# Patient Record
Sex: Male | Born: 1975 | Race: White | Hispanic: No | Marital: Single | Smoking: Current every day smoker
Health system: Southern US, Community
[De-identification: ages and names within clinical notes are randomized; demographics above are authoritative.]

## PROBLEM LIST (undated history)

## (undated) DIAGNOSIS — B1920 Unspecified viral hepatitis C without hepatic coma: Secondary | ICD-10-CM

## (undated) DIAGNOSIS — F329 Major depressive disorder, single episode, unspecified: Secondary | ICD-10-CM

## (undated) DIAGNOSIS — A4902 Methicillin resistant Staphylococcus aureus infection, unspecified site: Secondary | ICD-10-CM

## (undated) DIAGNOSIS — F1121 Opioid dependence, in remission: Secondary | ICD-10-CM

## (undated) DIAGNOSIS — I442 Atrioventricular block, complete: Secondary | ICD-10-CM

## (undated) DIAGNOSIS — Z953 Presence of xenogenic heart valve: Secondary | ICD-10-CM

## (undated) DIAGNOSIS — I079 Rheumatic tricuspid valve disease, unspecified: Secondary | ICD-10-CM

## (undated) DIAGNOSIS — Z8679 Personal history of other diseases of the circulatory system: Principal | ICD-10-CM

## (undated) DIAGNOSIS — Z950 Presence of cardiac pacemaker: Secondary | ICD-10-CM

## (undated) HISTORY — PX: BELOW KNEE LEG AMPUTATION: SUR23

## (undated) HISTORY — DX: Presence of xenogenic heart valve: Z95.3

## (undated) HISTORY — DX: Unspecified viral hepatitis C without hepatic coma: B19.20

## (undated) HISTORY — DX: Personal history of other diseases of the circulatory system: Z86.79

## (undated) HISTORY — DX: Major depressive disorder, single episode, unspecified: F32.9

## (undated) HISTORY — PX: OTHER SURGICAL HISTORY: SHX169

## (undated) HISTORY — PX: PACEMAKER REVISION: EP1221

## (undated) HISTORY — DX: Opioid dependence, in remission: F11.21

## (undated) HISTORY — DX: Atrioventricular block, complete: I44.2

---

## 2017-10-03 DIAGNOSIS — I2690 Septic pulmonary embolism without acute cor pulmonale: Secondary | ICD-10-CM | POA: Insufficient documentation

## 2017-12-08 ENCOUNTER — Ambulatory Visit: Payer: Self-pay

## 2017-12-09 ENCOUNTER — Ambulatory Visit: Payer: Self-pay

## 2017-12-11 ENCOUNTER — Ambulatory Visit: Payer: Self-pay | Admitting: Internal Medicine

## 2017-12-11 ENCOUNTER — Encounter: Payer: Self-pay | Admitting: Internal Medicine

## 2017-12-11 ENCOUNTER — Other Ambulatory Visit: Payer: Self-pay

## 2017-12-11 VITALS — BP 121/77 | HR 100 | Temp 98.7°F | Wt 141.6 lb

## 2017-12-11 DIAGNOSIS — Z21 Asymptomatic human immunodeficiency virus [HIV] infection status: Secondary | ICD-10-CM

## 2017-12-11 DIAGNOSIS — F1111 Opioid abuse, in remission: Secondary | ICD-10-CM

## 2017-12-11 DIAGNOSIS — Z09 Encounter for follow-up examination after completed treatment for conditions other than malignant neoplasm: Secondary | ICD-10-CM

## 2017-12-11 DIAGNOSIS — B1920 Unspecified viral hepatitis C without hepatic coma: Secondary | ICD-10-CM

## 2017-12-11 DIAGNOSIS — Z79899 Other long term (current) drug therapy: Secondary | ICD-10-CM

## 2017-12-11 DIAGNOSIS — Z950 Presence of cardiac pacemaker: Secondary | ICD-10-CM

## 2017-12-11 DIAGNOSIS — F32A Depression, unspecified: Secondary | ICD-10-CM | POA: Insufficient documentation

## 2017-12-11 DIAGNOSIS — Z833 Family history of diabetes mellitus: Secondary | ICD-10-CM

## 2017-12-11 DIAGNOSIS — I442 Atrioventricular block, complete: Secondary | ICD-10-CM | POA: Insufficient documentation

## 2017-12-11 DIAGNOSIS — F3340 Major depressive disorder, recurrent, in remission, unspecified: Secondary | ICD-10-CM

## 2017-12-11 DIAGNOSIS — F17211 Nicotine dependence, cigarettes, in remission: Secondary | ICD-10-CM

## 2017-12-11 DIAGNOSIS — Z8614 Personal history of Methicillin resistant Staphylococcus aureus infection: Secondary | ICD-10-CM

## 2017-12-11 DIAGNOSIS — Z8679 Personal history of other diseases of the circulatory system: Secondary | ICD-10-CM

## 2017-12-11 DIAGNOSIS — Z954 Presence of other heart-valve replacement: Secondary | ICD-10-CM

## 2017-12-11 DIAGNOSIS — Z953 Presence of xenogenic heart valve: Secondary | ICD-10-CM

## 2017-12-11 DIAGNOSIS — F1121 Opioid dependence, in remission: Secondary | ICD-10-CM

## 2017-12-11 DIAGNOSIS — F329 Major depressive disorder, single episode, unspecified: Secondary | ICD-10-CM | POA: Insufficient documentation

## 2017-12-11 HISTORY — DX: Opioid dependence, in remission: F11.21

## 2017-12-11 HISTORY — DX: Presence of xenogenic heart valve: Z95.3

## 2017-12-11 HISTORY — DX: Personal history of other diseases of the circulatory system: Z86.79

## 2017-12-11 HISTORY — DX: Depression, unspecified: F32.A

## 2017-12-11 MED ORDER — ASPIRIN EC 81 MG PO TBEC: 81.0000 mg | DELAYED_RELEASE_TABLET | Freq: Every day | ORAL | 2 refills | Status: AC

## 2017-12-11 MED ORDER — DULOXETINE HCL 60 MG PO CPEP: 60.0000 mg | ORAL_CAPSULE | Freq: Every day | ORAL | 3 refills | Status: DC

## 2017-12-11 NOTE — Patient Instructions (Addendum)
Thank you for visiting clinic today. I am glad you are doing well and making positive life long changes. I am giving you a referral to see a cardiologist and a cardiothoracic surgeon, please make an appointment according to your convenience. As we discussed that we offer Suboxone treatment for opioid use disorder in our clinic, if you need any help with your disease please let us know. Please follow-up in [id number]-month.

## 2017-12-11 NOTE — Assessment & Plan Note (Signed)
He denies any ongoing depressive symptoms. His symptoms are well controlled on Cymbalta 60 mg daily. His PHQ-9 score today was 2.  -Continue Cymbalta 60 mg daily-refill was provided.

## 2017-12-11 NOTE — Assessment & Plan Note (Signed)
He recently has surgery approximately 3 weeks ago.  He needs to follow-up with the cardiothoracic surgeon. He was having well-healing sternotomy and pacemaker scars. He was not on any anticoagulation, he was told to take just aspirin.  -Referral for cardiothoracic surgery was provided.

## 2017-12-11 NOTE — Assessment & Plan Note (Signed)
He developed the post operative persistent complete heart block, currently s/o dual-chamber pacemaker. He denies any symptoms and exam was within normal limit.  He needs to be established with cardiology for a regular pacemaker check.  Referral for cardiology was provided.

## 2017-12-11 NOTE — Assessment & Plan Note (Addendum)
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  Per chart review he was positive for hep C antibody with negative viral load.  He was also positive for 1/2 HIV antibody, with undetected viral load.  Patient is high risk for HIV and hep C. I called the patient and talk with his mom, she agreed to repeat testing. She would like a blood draw early next week when she will come to pick up his paperwork.  Orders for CBC, CMP, hep C and HIV were placed. He was also provided a letter for rehab program as he was interested in inpatient rehab for his opioid disorder. He was offered to contact our Suboxone clinic if needed.

## 2017-12-11 NOTE — Progress Notes (Signed)
CC: Establish care after having a tricuspid valve replacement.  HPI:  [name] is a 41 y.o. 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  IV Opioid use disorder.  Per chart review patient has a long history of IV heroin abuse with needle sharing.  Apparently he was in prison in [place], where he developed fever, chills, productive cough and dyspnea resulted in [place] Patient currently is sober.  He and his mother are interested in a inpatient rehab program for opioid disorder.  He has tried methadone and Suboxone in the past.  Currently he wants to stay away from heroin without any medication.  He was offered help with our Suboxone clinic, he will contact us if he wants to continue that route.  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 He was initially treated for bilateral interstitial pneumonia, later developed positive blood cultures for MRSA sensitive to vancomycin and resistant to daptomycin, found to have large tricuspid vegetation causing bilateral pulmonary embolic infiltrates resulted in respiratory failure.  Patient was treated with IV vancomycin, his first negative blood culture was on October 14, 2017, he completed 6 weeks of antibiotics after that, his last dose of antibiotic was on November 27, 2017.  Tricuspid valve replacement.  He underwent bioprosthetic tricuspid valve replacement on November 17, 2017 due to severe regurgitation.  His culture of tricuspid valve was negative.  He was just advised to continue aspirin.  Complete heart block.  He developed complete heart block on postoperative day 3, initially treated with temporary  pacing, because of persistent heart block dual-chamber Saint Jude pacemaker was placed on postoperative day 7.  Patient has no complaints since then.  He denies any chest pain, palpitations, dizziness, dyspnea, orthopnea or PND.  He was discharged from hospital on November 27, 2017, and he moved to [place] next [place] via a 3-day train.  He needs a follow-up with cardiology and cardiothoracic surgery.  Past Medical History:  Diagnosis Date  . Depression 12/11/2017  . History of complete heart block 12/11/2017  . 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 Opioid use disorder, moderate, in early remission (HCC) 12/11/2017     Family history.  Both maternal grandparents were diabetic.  Mother and siblings are healthy.  On chart review mother has an history of uterine cancer, but she mentioned nothing about that today.  Social history.  41 year old single who is currently living with his mom, had one 171 year old son who lives with his mom. Former smoker-quit in October 2018-used to smoke half pack per day for 15 years. -Occasionally drink alcohol. -History of IV heroin abuse for many years with needle sharing. -He used to work as an Tourist information centre manageriron worker, currently unemployed.   Review of Systems:  General: Denies fever, chills, fatigue, unexpected weight loss, change in appetite and diaphoresis.  Respiratory: Denies SOB, cough, DOE, chest tightness, and wheezing.   Cardiovascular: Denies chest pain and palpitations.  Gastrointestinal: Denies nausea, vomiting, abdominal pain, diarrhea, constipation, blood in stool and abdominal distention.  Genitourinary: Denies dysuria, urgency, frequency, hematuria, suprapubic pain and flank pain. Endocrine: Denies hot or cold intolerance, polyuria, and polydipsia. Musculoskeletal: Denies myalgias, back pain, joint swelling, arthralgias and gait problem.  Skin:  Denies pallor, rash and wounds.  Neurological: Denies dizziness,  headaches, weakness, lightheadedness, numbness, seizures, and syncope. Psychiatric/Behavioral: Denies mood changes, confusion, nervousness, sleep disturbance and agitation.   Physical Exam:  Vitals:   12/11/17 1048  BP: 121/77  Pulse: 100  Temp: 98.7 F (37.1 C)  TempSrc: Oral  SpO2: 100%  Weight: 141 lb 9.6 oz (64.2 kg)    General: Vital signs reviewed.  Patient is well-developed and well-nourished, in no acute distress and cooperative with exam.  Head: Normocephalic and atraumatic. Eyes: EOMI, conjunctivae normal, no scleral icterus.  Neck: Supple, trachea midline, normal ROM, no JVD, masses, thyromegaly, or carotid bruit present.  Cardiovascular: Well-healing sternal ostomy and the pacemaker placement scars.  Regular rate and rhythm. Pulmonary/Chest: Clear to auscultation bilaterally, no wheezes, rales, or rhonchi. Abdominal: Soft, non-tender, non-distended, BS +, no masses, organomegaly, or guarding. Extremities: No lower extremity edema bilaterally,  pulses symmetric and intact bilaterally. No cyanosis or clubbing. Neurological: A&O x3, Strength is normal and symmetric bilaterally, cranial nerve II-XII are grossly intact, no focal motor deficit, sensory intact to light touch bilaterally.  Skin: Warm, dry and intact. No rashes or erythema. Psychiatric: Normal mood and affect. speech and behavior is normal. Cognition and memory are normal.  Assessment & Plan:   See Encounters Tab for problem based charting.  Patient seen with Dr. Oswaldo DoneVincent

## 2017-12-12 NOTE — Progress Notes (Signed)
Internal Medicine Clinic Attending  I saw and evaluated the patient.  I personally confirmed the key portions of the history and exam documented by Dr. Nelson ChimesAmin and I reviewed pertinent patient test results.  The assessment, diagnosis, and plan were formulated together and I agree with the documentation in the resident's note.  41 year old person with opioid use disorder recently complicated by tricuspid valve endocarditis, managed with extended antibiotics and subsequent bioprosthetic tricuspid valve replacement three weeks ago. Post-operative period was complicated by complete heart block managed with a dual chamber pacer. The patient is feeling well, good exertional capacity, no HF symptoms, normal exam with no signs of volume overload. I offered him buprenorphine to manage the OUD, which he is going to consider. He is looking for a local residential treatment center in [place] to jump start his recovery, and will let me know if he wants to restart Suboxone in the future.

## 2017-12-16 ENCOUNTER — Telehealth: Payer: Self-pay | Admitting: *Deleted

## 2017-12-16 NOTE — Telephone Encounter (Signed)
Ok. He certainly would qualify for [facility] treatment. We can see him tomorrow. Thanks.

## 2017-12-16 NOTE — Telephone Encounter (Signed)
Pt's mother calls and states pt would like to be seen asap for OUD clinic, states pt is at her side at this time and I do hear him say yes. Is [facility] and then set established appt in OUD?

## 2017-12-17 ENCOUNTER — Ambulatory Visit (INDEPENDENT_AMBULATORY_CARE_PROVIDER_SITE_OTHER): Payer: Self-pay | Admitting: Student in an Organized Health Care Education/Training Program

## 2017-12-17 ENCOUNTER — Ambulatory Visit (HOSPITAL_COMMUNITY)
Admission: RE | Admit: 2017-12-17 | Discharge: 2017-12-17 | Disposition: A | Discharge status: Home / Self Care | Payer: Self-pay | Source: Ambulatory Visit | Attending: Internal Medicine | Admitting: Internal Medicine

## 2017-12-17 ENCOUNTER — Encounter: Payer: Self-pay | Admitting: Student in an Organized Health Care Education/Training Program

## 2017-12-17 ENCOUNTER — Other Ambulatory Visit: Payer: Self-pay

## 2017-12-17 VITALS — BP 116/81 | HR 84 | Temp 97.8°F | Ht 70.0 in | Wt 140.9 lb

## 2017-12-17 DIAGNOSIS — I442 Atrioventricular block, complete: Secondary | ICD-10-CM | POA: Insufficient documentation

## 2017-12-17 DIAGNOSIS — Z8619 Personal history of other infectious and parasitic diseases: Secondary | ICD-10-CM

## 2017-12-17 DIAGNOSIS — Z952 Presence of prosthetic heart valve: Secondary | ICD-10-CM

## 2017-12-17 DIAGNOSIS — Z7982 Long term (current) use of aspirin: Secondary | ICD-10-CM

## 2017-12-17 DIAGNOSIS — F329 Major depressive disorder, single episode, unspecified: Secondary | ICD-10-CM

## 2017-12-17 DIAGNOSIS — Z950 Presence of cardiac pacemaker: Secondary | ICD-10-CM

## 2017-12-17 DIAGNOSIS — F1121 Opioid dependence, in remission: Secondary | ICD-10-CM

## 2017-12-17 DIAGNOSIS — Z79899 Other long term (current) drug therapy: Secondary | ICD-10-CM

## 2017-12-17 DIAGNOSIS — F1120 Opioid dependence, uncomplicated: Secondary | ICD-10-CM

## 2017-12-17 MED ORDER — BUPRENORPHINE HCL-NALOXONE HCL 8-2 MG SL SUBL: 1.0000 | SUBLINGUAL_TABLET | Freq: Two times a day (BID) | SUBLINGUAL | 0 refills | Status: DC

## 2017-12-17 NOTE — Assessment & Plan Note (Signed)
Severe opioid use disorder, would benefit from medication assisted treatment especially given his recent complication of tricuspid valve endocarditis requiring valve replacement.  He is done well with Suboxone treatment in the past.  Last use was in September, currently abstinent from all opioids so no risk of precipitated withdrawal.  Currently does not have insurance, hopefully will qualify for [place] given heart disease. Plan is to start Buprenorphine-Naloxone 8mg  BID.  I am going to prescribe him the generic tablet which is probably going to be the cheapest out-of-pocket option.  His mother is going to be providing him with financial support for now.  Urine tox screen obtained today.  Will check CMP, HIV and hepatitis C antibodies.  Follow-up in 2 weeks on 1/2.  I will call him tomorrow to check in on his induction which I anticipate will go well.  We can apply for pharmaceutical assistance with Suboxone for long-term coverage.

## 2017-12-17 NOTE — Patient Instructions (Signed)

## 2017-12-17 NOTE — Progress Notes (Signed)
12/17/2017  Wesley Fuentes presents for buprenorphine/naloxone intake visit.   41 year old man here for follow-up of opioid use disorder.  Patient has been using opioids for about 10 years, started with oxycodone and OxyContin and then transition to injection heroin use about 8 years ago.  He went into treatment for a few months about 6 years ago with Suboxone and reports doing fairly well.  He moved to [place] from around the new environment.  Unfortunately he relapsed about 4 years ago and has been using heroin ever since then.  He was admitted to hospital in [place] in September for bacterial endocarditis of his tricuspid valve, was admitted for an extended period of time to receive prolonged antibiotics.  He eventually had a tricuspid valve porcine replacement in November because of severe regurgitation.  This procedure was complicated by complete heart block which was managed with a biventricular pacemaker.  When he was well enough to discharge from the hospital [place] live with [place] mother.  Reports that she is very supportive.  He has a stable living environments, lives at his mother's house, there are no drugs there.  He reports strong motivation to maintain his abstinence.  He is going to travel up to [place] with his mother for the Christmas holiday next week.  He is nervous that there will be triggers for opioid use from his past.  His brother also has opiate use disorder but is on treatment with Suboxone.  He denies any other illicit substance use currently, no benzo use, he has a history of amphetamine and cocaine use but nothing recent.  Denies a history of depression or bipolar disease or psychiatric admission.  He has been arrested once for stealing in order to obtain money for his addiction.  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  Last substance used: Heroin September 2018  Mental  Health History: Mild-moderate depression  Current counseling/behavioural health provider: None  This patient has Opioid Use Disorder by following DSM-V criteria: Keep those that apply.  - Opioids taken in larger amounts or over a longer period than intended - Persistent desire to stop - A great deal of time is spent to obtain/use/recover from the opioid - Cravings to use opioids - Use resulting in a failure to fulfill major role obligations - Continue opioid use despite persistent social or interpersonal problems - Important activities are given up or reduced because of opioid use - Recurrent opioid use in situations in which it is physically hazardous - Use despite knowledge of health problems caused by opioids - Tolerance   Past Medical History:  Diagnosis Date  . Depression 12/11/2017  . History of complete heart block 12/11/2017  . 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 Opioid use disorder, moderate, in early remission (HCC) 12/11/2017    Current Outpatient Medications on File Prior to Visit  Medication Sig Dispense Refill  . aspirin EC 81 MG tablet Take 1 tablet (81 mg total) by mouth daily. 150 tablet 2  . DULoxetine (CYMBALTA) 60 MG capsule Take 1 capsule (60 mg total) by mouth daily. 30 capsule 3   No current facility-administered medications on file prior to visit.     Physical Exam  Vitals:   12/17/17 1543  BP: 116/81  Pulse: 84  Temp: 97.8 F (36.6 C)  TempSrc: Oral  SpO2: 100%  Weight: 140 lb 14.4 oz (63.9 kg)  Height: 5\' 10"  (1.778 m)    Gen well-appearing young man, no distress:  CV: Well-healing sternotomy scar and left-sided pacemaker, regular rate and rhythm with no significant murmurs Lungs: Unlabored clear throughout Abd: thin, nontender, nondistended:  Ext: Warm with no lower extremity edema  Assessment/Plan:   Based on a review of the patient's medical history including substance use and mental health  factors, and physical exam, Wesley Proicholas S Fuentes is a suitable candidate for MAT with buprenorphine/naloxone.  UDS ordered this visit.    I have discussed HIV and Hepatitis C screening with this patient.    I will place orders for CMET for baseline LFT evaluation if needed.    Home Induction:   I have instructed the patient how to appropriately take this medication, including placing under the tongue with head relaxed for 10 minutes and allowing to dissolve without chewing or swallowing tab/film, and with nothing to eat or drink in the subsequent 15 minutes.  They have been told not to start taking the medication until they have significant signs of withdrawal and I have explained the concept of precipitated withdrawal with the patient.    Intervisit Care:  I will call the patient the morning after induction to assess symptom burden and determine the need for additional dose titration.  We discussed this medication must be kept in a safe place and away from children.   We will see the patient back in 2 weeks in clinic [facility] has closures next week because of winter holidays).  Patient was encouraged to call the office and speak with the MD on call for any urgent concerns.    Tyson AliasVincent, Duncan Thomas, MD 12/17/2017 5:07 PM

## 2017-12-17 NOTE — Progress Notes (Signed)
Cardiology Office Note   Date:  12/19/2017   ID:  Wesley Proicholas S Romick, DOB 01/26/1976, MRN [id number]  PCP:  Levora DredgeHelberg, Justin, MD  Cardiologist:   No primary care provider on file. Referring:  Levora DredgeHelberg, Justin, MD  Chief Complaint  Patient presents with  . TR Replacement      History of Present Illness: Wesley Fuentes is a 41 y.o. male who is referred by Levora DredgeHelberg, Justin, MD for follow up of tricuspid valve replacement and pacemaker placement.     He presented for follow up with a history of SBE.  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     He was living in [place].  He has a history of IV drug abuse and he developed meth resistant staph aureus.  I reviewed extensive hospital records.  He had septic emboli or vegetation on the tricuspid valve but no other valvular involvement.  He underwent valve replacement with a 27 mm Mosaic prosthetic mitral valve.  Postop he had complete heart block that did not resolve so he [facility] pac[facility].  He finished a course of antibiotics and is moved here to live with his mother.  He has been doing well.  He is thinking of entering rehab.  He is currently not using drugs.  He is a week and gets fatigued when he tries to walk but he denies any acute cardiovascular symptoms.  Is not had any palpitations, presyncope or syncope.  He has had no shortness of breath, PND or orthopnea.  Has had no weight gain or edema.   Past Medical History:  Diagnosis Date  . CHB (complete heart block) (HCC)   . Depression 12/11/2017  . Hepatitis C   . History of complete heart block 12/11/2017  . 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 Opioid use disorder, moderate, in early remission (HCC) 12/11/2017    Past Surgical History:  Procedure Laterality Date  . Permanent pacemaker placement     On  11/24/17-[facility] dual-chamber permanent pacemaker placed.  . Tricuspid valve replacement     On 11/17/17-replacement of tricuspid valve with a 29 mm Medtronic Mosaic mitral prosthesis.     Current Outpatient Medications  Medication Sig Dispense Refill  . aspirin EC 81 MG tablet Take 1 tablet (81 mg total) by mouth daily. 150 tablet 2  . buprenorphine-naloxone (SUBOXONE) 8-2 mg SUBL SL tablet Place 1 tablet under the tongue 2 (two) times daily. 28 tablet 0  . DULoxetine (CYMBALTA) 60 MG capsule Take 1 capsule (60 mg total) by mouth daily. 30 capsule 3   No current facility-administered medications for this visit.     Allergies:   Patient has no known allergies.    Social History:  The patient  reports that he has quit smoking. he has never used smokeless tobacco.   Family History:  The patient's family history includes Diabetes in his maternal grandfather and maternal grandmother.    ROS:  Please see the history of present illness.   Otherwise, review of systems are positive for none.   All other systems are reviewed and negative.    PHYSICAL EXAM: VS:  BP 118/82   Pulse 73   Ht 5\' 10"  (1.778 m)   Wt 143 lb (64.9 kg)   BMI 20.52 kg/m  , BMI Body mass index is 20.52 kg/m. GENERAL:  Well appearing  HEENT:  Pupils equal round and reactive, fundi not visualized, oral mucosa unremarkable NECK:  No jugular venous distention, waveform within normal limits, carotid upstroke brisk and symmetric, no bruits, no thyromegaly LYMPHATICS:  No cervical, inguinal adenopathy LUNGS:  Clear to auscultation bilaterally CHEST:  Well healed sternotomy scar.  Well-healed pacemaker pocket HEART:  PMI not displaced or sustained,S1 and S2 within normal limits, no S3, no S4, no clicks, no rubs, no murmurs ABD:  Flat, positive bowel sounds normal in frequency in pitch, no bruits, no rebound, no guarding, no midline pulsatile mass, no hepatomegaly, no splenomegaly EXT:  2 plus pulses throughout, no  edema, no cyanosis no clubbing SKIN:  No rashes no nodules NEURO:  Cranial nerves II through XII grossly intact, motor grossly intact throughout PSYCH:  Cognitively intact, oriented to person place and time    EKG:  EKG is ordered today. The ekg ordered today demonstrates sinus rhythm, rate 73, left bundle branch block, no acute ST-T wave changes.   Recent Labs: 12/17/2017: ALT 10; BUN 12; Creatinine, Ser 0.83; Hemoglobin 14.8; Platelets 257; Potassium 4.7; Sodium 141    Lipid Panel No results found for: CHOL, TRIG, HDL, CHOLHDL, VLDL, LDLCALC, LDLDIRECT    Wt Readings from Last 3 Encounters:  12/19/17 143 lb (64.9 kg)  12/17/17 140 lb 14.4 oz (63.9 kg)  12/11/17 141 lb 9.6 oz (64.2 kg)      Other studies Reviewed: Additional studies/ records that were reviewed today include: Hundreds of pages of records from other hospitals.  . Review of the above records demonstrates:  Please see elsewhere in the note.     ASSESSMENT AND PLAN:  TVR: Clinically he looks well from this.  We talked about endocarditis as prophylaxis and good dental hygiene.  No further imaging is indicated at this point.  I will follow this clinically and with repeat echoes in the future.  PPM: I will have him establish in our pacemaker clinic.  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  I did call the [facility] rep.  We talked about this.  He has ventricular pacing on this most recent EKG.  He was 87% V paced previously.  We can look further at this and whether he is indeed pacemaker dependent.  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  I think it would be preferable to his sobriety to get him back to this career which he loves if possible.  I will send him to the EP clinic for further  consideration.   Current medicines are reviewed at length with the patient today.  The patient does not have concerns regarding medicines.  The following changes have been made:  no change  Labs/ tests ordered today include: None  Orders Placed This Encounter  Procedures  . EKG 12-Lead     Disposition:   FU with me in six months.      Signed, Rollene RotundaJames Amoree Newlon, MD  12/19/2017 12:49 PM    [facility]

## 2017-12-17 NOTE — Assessment & Plan Note (Addendum)
Symptomatically doing very well with no signs of heart failure, lightheadedness, or dyspnea on exertion.  ECG shows biventricular pacing with a rate of 94.  He has a [employer] Jude dual-chamber pacemaker placed in November 2018 for complete heart block.  This seems to be well managed with current pacing.  He has made a follow-up appointment with cardiology for device interrogation.  Otherwise I think he is doing well.

## 2017-12-18 ENCOUNTER — Telehealth: Payer: Self-pay | Admitting: Internal Medicine

## 2017-12-18 ENCOUNTER — Other Ambulatory Visit: Payer: Self-pay | Admitting: Internal Medicine

## 2017-12-18 DIAGNOSIS — R768 Other specified abnormal immunological findings in serum: Secondary | ICD-10-CM

## 2017-12-18 LAB — CBC
HEMOGLOBIN: 14.8 g/dL (ref 13.0–17.7)
Hematocrit: 45.9 % (ref 37.5–51.0)
MCH: 26.7 pg (ref 26.6–33.0)
MCHC: 32.2 g/dL (ref 31.5–35.7)
MCV: 83 fL (ref 79–97)
Platelets: 257 10*3/uL (ref 150–379)
RBC: 5.55 x10E6/uL (ref 4.14–5.80)
RDW: 16.9 % — ABNORMAL HIGH (ref 12.3–15.4)
WBC: 7.1 10*3/uL (ref 3.4–10.8)

## 2017-12-18 LAB — CMP14 + ANION GAP
A/G RATIO: 1.8 (ref 1.2–2.2)
ALT: 10 IU/L (ref 0–44)
AST: 15 IU/L (ref 0–40)
Albumin: 5 g/dL (ref 3.5–5.5)
Alkaline Phosphatase: 105 IU/L (ref 39–117)
Anion Gap: 18 mmol/L (ref 10.0–18.0)
BUN/Creatinine Ratio: 14 (ref 9–20)
BUN: 12 mg/dL (ref 6–20)
Bilirubin Total: 0.3 mg/dL (ref 0.0–1.2)
CALCIUM: 10.1 mg/dL (ref 8.7–10.2)
CO2: 20 mmol/L (ref 20–29)
Chloride: 103 mmol/L (ref 96–106)
Creatinine, Ser: 0.83 mg/dL (ref 0.76–1.27)
GFR, EST AFRICAN AMERICAN: 128 mL/min/{1.73_m2} (ref >59)
GFR, EST NON AFRICAN AMERICAN: 111 mL/min/{1.73_m2} (ref >59)
GLUCOSE: 71 mg/dL (ref 65–99)
Globulin, Total: 2.8 g/dL (ref 1.5–4.5)
POTASSIUM: 4.7 mmol/L (ref 3.5–5.2)
Sodium: 141 mmol/L (ref 134–144)
TOTAL PROTEIN: 7.8 g/dL (ref 6.0–8.5)

## 2017-12-18 LAB — HEPATITIS C ANTIBODY

## 2017-12-18 LAB — HIV ANTIBODY (ROUTINE TESTING W REFLEX): HIV Screen 4th Generation wRfx: NONREACTIVE

## 2017-12-18 NOTE — Telephone Encounter (Signed)
Patient mother is calling back returning a call from helen

## 2017-12-18 NOTE — Telephone Encounter (Signed)
Spoke w/ pt's mother about application for suboxone assist and coupons for current script, referred her to online coupons and to ask pharmacists for info on coupons, she was agreeable and will stay intouch

## 2017-12-19 ENCOUNTER — Encounter: Payer: Self-pay | Admitting: Cardiology

## 2017-12-19 ENCOUNTER — Ambulatory Visit (INDEPENDENT_AMBULATORY_CARE_PROVIDER_SITE_OTHER): Payer: Self-pay | Admitting: Cardiology

## 2017-12-19 VITALS — BP 118/82 | HR 73 | Ht 70.0 in | Wt 143.0 lb

## 2017-12-19 DIAGNOSIS — I442 Atrioventricular block, complete: Secondary | ICD-10-CM

## 2017-12-19 DIAGNOSIS — Z952 Presence of prosthetic heart valve: Secondary | ICD-10-CM

## 2017-12-19 NOTE — Telephone Encounter (Signed)
Spoke to pt's mother today, she was able to go to [facility] and got script for 77.00 which was 1/2 of [facility] price

## 2017-12-19 NOTE — Patient Instructions (Addendum)
Medication Instructions:  Continue current medications  If you need a refill on your cardiac medications before your next appointment, please call your pharmacy.  Labwork: None Ordered   Testing/Procedures: None Ordered  Special Instructions:  Happy Holidays!!!  Follow-Up: Your physician wants you to follow-up with: Electrophysiologist Dr Elberta Fortisamnitz  Your physician recommends that you schedule a follow-up appointment in: 4 Months.     Thank you for choosing [facility]!!

## 2017-12-25 ENCOUNTER — Other Ambulatory Visit: Payer: Self-pay | Admitting: Cardiology

## 2017-12-25 LAB — TOXASSURE SELECT,+ANTIDEPR,UR

## 2017-12-31 ENCOUNTER — Ambulatory Visit: Payer: Self-pay

## 2017-12-31 ENCOUNTER — Ambulatory Visit (INDEPENDENT_AMBULATORY_CARE_PROVIDER_SITE_OTHER): Payer: Self-pay | Admitting: Internal Medicine

## 2017-12-31 ENCOUNTER — Other Ambulatory Visit: Payer: Self-pay

## 2017-12-31 ENCOUNTER — Encounter: Payer: Self-pay | Admitting: Internal Medicine

## 2017-12-31 VITALS — BP 147/89 | HR 76 | Temp 97.7°F | Ht 70.0 in | Wt 148.0 lb

## 2017-12-31 DIAGNOSIS — R768 Other specified abnormal immunological findings in serum: Secondary | ICD-10-CM

## 2017-12-31 DIAGNOSIS — Z952 Presence of prosthetic heart valve: Secondary | ICD-10-CM

## 2017-12-31 DIAGNOSIS — Z8679 Personal history of other diseases of the circulatory system: Secondary | ICD-10-CM

## 2017-12-31 DIAGNOSIS — F1121 Opioid dependence, in remission: Secondary | ICD-10-CM

## 2017-12-31 MED ORDER — BUPRENORPHINE HCL-NALOXONE HCL 8-2 MG SL FILM: 8.0000 mg | ORAL_FILM | Freq: Two times a day (BID) | SUBLINGUAL | 0 refills | Status: DC

## 2017-12-31 NOTE — Progress Notes (Signed)
   12/31/2017  Wesley Fuentes presents for follow up of opioid use disorder I have reviewed the prior induction visit, follow up visits, and telephone encounters relevant to opiate use disorder (OUD) treatment.   Current daily dose: buprenorphine-naloxone 16mg  daily  Date of Induction: 12/17/2017  Current follow up interval, in weeks: 1 week  The patient has been adherent with the buprenorphine for OUD contract.   Last UDS Result: buprenorphine was present although low amounts (unexpected)  HPI: Wesley Fuentes presents for follow up on OUD.  He states that he has not used opioid or illicit drugs since last clinic visit. He states that he uses buprenorphine 2 films daily. He denies any withdrawal symptoms or cravings. He states that he is looking into doing residential rehabilitation for 30 days. He states that some centers do not accept patients on Suboxone. He has thoughts on stopping buprenorphine since he does not have cravings currently. However, he states that if he thinks it will be helpful in reducing the risk of relapse he will continue buprenorphine.  Exam:   Vitals:   12/31/17 1000  BP: (!) 147/89  Pulse: 76  Temp: 97.7 F (36.5 C)  TempSrc: Oral  SpO2: 98%  Weight: 148 lb (67.1 kg)  Height: 5\' 10"  (1.778 m)    Physical Exam  Constitutional: He is well-developed, well-nourished, and in no distress.  Cardiovascular: Normal rate, regular rhythm and normal heart sounds.  Pulmonary/Chest: Effort normal and breath sounds normal. No respiratory distress. He has no wheezes. He has no rales.  Skin: Skin is warm and dry.     Assessment/Plan:  See Problem Based Charting in the Encounters Tab   Camelia PhenesHoffman, Julyanna Scholle Ratliff, DO  12/31/2017  10:05 AM

## 2017-12-31 NOTE — Patient Instructions (Signed)
Mr. Wesley Fuentes,  It was a pleasure meeting you today. Please follow up in one week.  Below are two residential centers that may accept suboxone treatment. [facility] : [phone] [facility]: [phone]

## 2017-12-31 NOTE — Assessment & Plan Note (Signed)
Assessment: Opiate use disorder moderate, in early remission Patient is doing well on 2 films daily of buprenorphine. His last UDS showed a small amount of buprenorphine which is unexpected since he had not been prescribed the medication at that time so may not be accurate. Will get a UDS today.  States he has not used any illicit drugs. Patient is contemplating residential rehabilitation. Gave names and numbers of 2 facilities that may accept patient's on suboxone.  Will see patient back in one week.  Plan -UDS -Suboxone  16 mg daily -Follow-up in one week

## 2018-01-01 NOTE — Progress Notes (Signed)
Internal Medicine Clinic Attending  I saw and evaluated the patient.  I personally confirmed the key portions of the history and exam documented by Dr. Mikey BussingHoffman and I reviewed pertinent patient test results.  The assessment, diagnosis, and plan were formulated together and I agree with the documentation in the resident's note.  Young man with severe OUD recently started on suboxone for MAT. He is high risk for relapse given his recent endocarditis and valve replacement so he decided to try suboxone again. Reports no relapses, no side effects to starting suboxone. Just approved for assistance through the pharmaceutical company. He is researching residential rehab options now. I encouraged him to consider a rehab center that is accepting of MAT and can teach him how to incorporate MAT into his longterm treatment plan, but we may have to adjust his MAT depending on which rehab he chooses to enter.

## 2018-01-02 ENCOUNTER — Encounter: Payer: Self-pay | Admitting: Internal Medicine

## 2018-01-02 ENCOUNTER — Ambulatory Visit (INDEPENDENT_AMBULATORY_CARE_PROVIDER_SITE_OTHER): Payer: Self-pay | Admitting: Internal Medicine

## 2018-01-02 VITALS — BP 122/84 | HR 84 | Ht 70.0 in | Wt 144.0 lb

## 2018-01-02 DIAGNOSIS — Z950 Presence of cardiac pacemaker: Secondary | ICD-10-CM

## 2018-01-02 DIAGNOSIS — I442 Atrioventricular block, complete: Secondary | ICD-10-CM

## 2018-01-02 DIAGNOSIS — Z952 Presence of prosthetic heart valve: Secondary | ICD-10-CM

## 2018-01-02 LAB — CUP PACEART INCLINIC DEVICE CHECK
Battery Voltage: 3.02 V
Brady Statistic RA Percent Paced: 3.8 %
Date Time Interrogation Session: 20190104173111
Implantable Lead Implant Date: 20181126
Implantable Lead Implant Date: 20181126
Implantable Lead Location: 753859
Implantable Pulse Generator Implant Date: 20181126
Lead Channel Impedance Value: 487.5 Ohm
Lead Channel Impedance Value: 687.5 Ohm
Lead Channel Pacing Threshold Amplitude: 0.375 V
Lead Channel Pacing Threshold Pulse Width: 0.4 ms
Lead Channel Pacing Threshold Pulse Width: 0.4 ms
Lead Channel Sensing Intrinsic Amplitude: 4.9 mV
Lead Channel Setting Pacing Amplitude: 1.5 V
Lead Channel Setting Sensing Sensitivity: 2 mV
MDC IDC LEAD LOCATION: 753860
MDC IDC MSMT BATTERY REMAINING LONGEVITY: 132 mo
MDC IDC MSMT LEADCHNL RA PACING THRESHOLD AMPLITUDE: 0.5 V
MDC IDC MSMT LEADCHNL RV SENSING INTR AMPL: 12 mV
MDC IDC SET LEADCHNL RV PACING AMPLITUDE: 0.625
MDC IDC SET LEADCHNL RV PACING PULSEWIDTH: 0.4 ms
MDC IDC STAT BRADY RV PERCENT PACED: 99.81 %
Pulse Gen Model: 2272
Pulse Gen Serial Number: 8962129

## 2018-01-02 NOTE — Patient Instructions (Signed)
Medication Instructions: Your physician recommends that you continue on your current medications as directed. Please refer to the Current Medication list given to you today.  Labwork: None Ordered  Procedures/Testing: None Ordered  Follow-Up: Your physician wants you to follow-up in: 6 MONTHS with [facility]. You will receive a reminder letter in the mail two months in advance. If you don't receive a letter, please call our office to schedule the follow-up appointment.   If you need a refill on your cardiac medications before your next appointment, please call your pharmacy.    

## 2018-01-02 NOTE — Addendum Note (Signed)
Addended by: Neomia DearPOWERS, Laiden Milles E on: 01/02/2018 06:40 PM   Modules accepted: Orders

## 2018-01-02 NOTE — Progress Notes (Signed)
ELECTROPHYSIOLOGY CONSULT NOTE  Patient ID: Wesley Fuentes, MRN: [id number], DOB/AGE: 1976/04/25 42 y.o. Admit date: (Not on file) Date of Consult: 01/02/2018  Primary Physician: Levora Dredge, MD Primary Cardiologist : [facility]     Wesley Fuentes is a 42 y.o. male who is being seen today for the evaluation of  Pacemaker function at the request of [facility].    HPI Wesley Fuentes is a 42 y.o. 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  Postoperatively he developed complete heart block.  He has no complaints of shortness of breath palpitations or orthopnea.  His addiction has been heroin.  He is currently on opiate substitutes.  He is considering rehab.    Past Medical History:  Diagnosis Date  . CHB (complete heart block) (HCC)   . Depression 12/11/2017  . Hepatitis C   . History of complete heart block 12/11/2017  . 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 Opioid use disorder, moderate, in early remission (HCC) 12/11/2017      Surgical History:  Past Surgical History:  Procedure Laterality Date  . Permanent pacemaker placement     On 11/24/17-had [facility] Jude dual-chamber permanent pacemaker placed.  . Tricuspid valve replacement     On 11/17/17-replacement of tricuspid valve with a 29 mm Medtronic Mosaic mitral prosthesis.     Home Meds: Prior to Admission medications   Medication Sig Start Date End Date Taking? Authorizing Provider  aspirin EC 81 MG tablet Take 1 tablet (81 mg total) by mouth daily. 12/11/17 12/11/18 Yes Arnetha Courser, MD  Buprenorphine HCl-Naloxone HCl (SUBOXONE) 8-2 MG FILM Place 1 Film under the tongue 2 (two) times daily. 12/31/17  Yes Tyson Alias, MD  DULoxetine (CYMBALTA) 60 MG capsule Take 1 capsule (60 mg total) by mouth daily. 12/11/17  Yes Arnetha Courser, MD    Allergies:  No Known Allergies  Social History   Socioeconomic History  . Marital status: Single    Spouse name: Not on file  . Number of children: Not on file  . Years of education: Not on file  . Highest education level: Not on file  Social Needs  . Financial resource strain: Not on file  . Food insecurity - worry: Not on file  . Food insecurity - inability: Not on file  . Transportation needs - medical: Not on file  . Transportation needs - non-medical: Not on file  Occupational History  . Not on file  Tobacco Use  . Smoking status: Former Games developer  . Smokeless tobacco: Never Used  Substance and Sexual Activity  . Alcohol use: Not on file  . Drug use: Not on file  . Sexual activity: Not on file  Other Topics Concern  . Not on file  Social History Narrative  . Not on file     Family History  Problem Relation Age of Onset  . Diabetes Maternal Grandmother   . Diabetes Maternal Grandfather      ROS:  Please see the history of present illness.     All other systems reviewed and negative.    Physical Exam:  Blood pressure 122/84, pulse 84, height 5\' 10"  (1.778 m), weight 144 lb (65.3 kg), SpO2 95 %. General: Well developed, well nourished male in no acute distress. Head: Normocephalic, atraumatic, sclera non-icteric, no xanthomas, nares are without discharge. EENT: normal  Lymph Nodes:  none Neck: Negative for carotid bruits. JVD not elevated. Back:without scoliosis kyphosis  Lungs: Clear bilaterally to auscultation without wheezes, rales, or rhonchi. Breathing is unlabored. Well healed scar Heart: RRR with S1 S2. No murmur . No rubs, or gallops appreciated. Abdomen: Soft, non-tender, non-distended with normoactive bowel sounds. No hepatomegaly. No rebound/guarding. No obvious abdominal masses. Msk:  Strength and tone appear normal for age. Extremities: No clubbing or cyanosis. No edema.  Distal pedal pulses are 2+ and equal bilaterally. Skin: Warm and Dry Neuro: Alert and  oriented X 3. CN III-XII intact Grossly normal sensory and motor function . Psych:  Responds to questions appropriately with a normal affect.      Labs: Cardiac Enzymes No results for input(s): CKTOTAL, CKMB, TROPONINI in the last 72 hours. CBC Lab Results  Component Value Date   WBC 7.1 12/17/2017   HGB 14.8 12/17/2017   HCT 45.9 12/17/2017   MCV 83 12/17/2017   PLT 257 12/17/2017   PROTIME: No results for input(s): LABPROT, INR in the last 72 hours. Chemistry No results for input(s): NA, K, CL, CO2, BUN, CREATININE, CALCIUM, PROT, BILITOT, ALKPHOS, ALT, AST, GLUCOSE in the last 168 hours.  Invalid input(s): LABALBU Lipids No results found for: CHOL, HDL, LDLCALC, TRIG BNP No results found for: PROBNP Thyroid Function Tests: No results for input(s): TSH, T4TOTAL, T3FREE, THYROIDAB in the last 72 hours.  Invalid input(s): FREET3 Miscellaneous No results found for: DDIMER  Radiology/Studies:  No results found.  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  We have reprogrammed his device to allow him to conduct.  We will plan to see him again in 4 months time.     Sherryl MangesSteven Verlie Liotta

## 2018-01-04 LAB — TOXASSURE SELECT,+ANTIDEPR,UR

## 2018-01-06 ENCOUNTER — Telehealth: Payer: Self-pay | Admitting: Student in an Organized Health Care Education/Training Program

## 2018-01-06 NOTE — Telephone Encounter (Signed)
Patient's Mother Britta MccreedyBarbara called in reference to pt's f/u appt for today and wanted to know if he still needed to come for lab work.   Britta MccreedyBarbara also called to notify the office that the patient has been [facility].

## 2018-01-06 NOTE — Telephone Encounter (Signed)
I spoke with Wesley Fuentes. Wesley Fu[facility] is going to [facility] for residential rehab that will last 30 to 90 days depending on how he does. This is a state covered spot, so no cost to him, which is great. They do not allow for MAT during rehab, so he has been weaning down Suboxone already, he is low risk for going into withdrawal when he enters there. I advised that we will see him again once he finishes with rehab and see if he wants to restart Suboxone after that.   I think for now, we can remove him from the pharma assistance program as it seems likely that he will not need it for the coming months. Maybe do this in a week, just to make sure plans don't unexpectedly change when he first starts rehab. They are also working on applying for Medicaid, which will be a months long process itself.

## 2018-03-16 ENCOUNTER — Telehealth: Payer: Self-pay | Admitting: Internal Medicine

## 2018-03-16 NOTE — Telephone Encounter (Signed)
Patient would like a letter written for Court.  Pt only wants to talk to you.

## 2018-03-17 NOTE — Telephone Encounter (Signed)
Attempted to call patient. Did not answer. Will try again later.

## 2018-05-04 ENCOUNTER — Ambulatory Visit: Payer: Self-pay | Admitting: Cardiology

## 2019-02-23 DIAGNOSIS — I071 Rheumatic tricuspid insufficiency: Secondary | ICD-10-CM | POA: Insufficient documentation

## 2019-08-14 DIAGNOSIS — D696 Thrombocytopenia, unspecified: Secondary | ICD-10-CM | POA: Insufficient documentation

## 2019-09-17 DIAGNOSIS — I96 Gangrene, not elsewhere classified: Secondary | ICD-10-CM | POA: Insufficient documentation

## 2020-04-16 DIAGNOSIS — I38 Endocarditis, valve unspecified: Secondary | ICD-10-CM | POA: Insufficient documentation

## 2020-04-16 DIAGNOSIS — T826XXA Infection and inflammatory reaction due to cardiac valve prosthesis, initial encounter: Secondary | ICD-10-CM | POA: Insufficient documentation

## 2020-04-19 NOTE — Progress Notes (Signed)
Cardiology Office Note Date:  04/20/2020  Patient ID:  Wesley Fuentes, DOB 1976-11-30, MRN [id number] PCP:  Patient, No Pcp Per  Cardiologist:  Dr. Antoine Poche EP: Dr. Graciela Husbands    Chief Complaint: re-establish pacer follow up  History of Present Illness: Wesley Fuentes is a 44 y.o. male with history of endocarditis (in the environment of IV drug use, complicated by septic emboli) s/p TVR 2018 , post op CHB > PPM, Hep C.  He comes in today to be seen for Dr. Graciela Husbands, last seen by him Jan 2019 as a new pt, at that time doing well, no complaints.  He was conducting with prolonged PR, narrow QRS and programmed to allow intrinsic conduction.  He comes today accompanied by his aunt.   He had moved to [place], while there unfortunately relapsed back to heroine.  He was hospitalized  Initial hospitalization 8/13-09/07/2019 with endocarditis, complicated with septic emboli to b/l LE, and RUE, b.l lungs and spleen, he had DIC, AKI.  His feet and hand becoming necrotic and initially felt palliative measures best, and ultimately sent home w./hospice. Ultimately though back in the hospital 11/12/2019 for b/l LE and RUE amputations, he developed R IJ DVT 2/2 picc, planned for coumadin Post op seemed to have hematoma (though unclear where) requiring ICU and pressors. Through this, not felt to be candidate for re-operation on his TV and recommended life-long antibiotics. He developed PPM erosion, this site grew MRSA 03/06/2020 he underwent PPM system extraction with and abdominal implant of PPM with single epicardia lead (RV). 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   He was anemic, suspect 2/2 malnutrition, though recommended outpt colonoscopy His hospitalization lasting 158 days in total. (some of which was waiting on rehab bed availability 2/2 COVID restrictions. D/C summary reports recommend xarelto tx to  complete 07/18/2020  He has been in contact with PMD office, should have an appt in 2 weeks, he saw the methadone clinic this AM, and [place] this afternoon, needs general cardiology follow up to be made for surveillance of his TV and volume management most likely.  They do not have and tests, though we have requested any echos he had, last labs done, cardiology/EP consultations.  Device information MDT single chamber PPM (RV lead is epicardial), implanted 03/06/2020 in [place] This is NOT MRI conditional Previously had SJM dual chamber PPM, implanted 11/24/2017 (implanted in [place]) THIS SYSTEM EXTRACTED 3/8/22021     Past Medical History:  Diagnosis Date  . CHB (complete heart block) (HCC)   . Depression 12/11/2017  . Hepatitis C   . History of complete heart block 12/11/2017  . 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 Opioid use disorder, moderate, in early remission (HCC) 12/11/2017    Past Surgical History:  Procedure Laterality Date  . Permanent pacemaker placement     On 11/24/17-had [facility] Jude dual-chamber permanent pacemaker placed.  . Tricuspid valve replacement     On 11/17/17-replacement of tricuspid valve with a 29 mm Medtronic Mosaic mitral prosthesis.    Current Outpatient Medications  Medication Sig Dispense Refill  . Acetaminophen 325 MG CAPS Take 325 mg by mouth every 6 (six) hours as needed.    . Ascorbic Acid (VITAMIN C WITH ROSE HIPS) 500 MG tablet Take 500 mg by mouth daily.    . bisacodyl (DULCOLAX) 5 MG EC tablet Take 5 mg by mouth 2 (two) times daily.    Marland Kitchen  bisacodyl (FLEET) 10 MG/30ML ENEM Place 10 mg rectally as needed.    . DULoxetine (CYMBALTA) 60 MG capsule Take 1 capsule (60 mg total) by mouth daily. 30 capsule 3  . famotidine (PEPCID) 20 MG tablet Take 20 mg by mouth 2 (two) times daily.    . ferrous sulfate 325 (65 FE) MG tablet Take 325 mg by mouth daily with breakfast.    . furosemide (LASIX) 40 MG tablet  Take 40 mg by mouth.    . methadone (DOLOPHINE) 10 MG tablet Take 10 mg by mouth every 8 (eight) hours as needed.    . minocycline (DYNACIN) 100 MG tablet Take 100 mg by mouth 2 (two) times daily.    . pantoprazole (PROTONIX) 40 MG tablet Take 40 mg by mouth daily.    . rivaroxaban (XARELTO) 20 MG TABS tablet Take 20 mg by mouth daily with supper.    . senna (SENNA-LAX) 8.6 MG tablet Take 1 tablet by mouth 2 (two) times daily.    . sertraline (ZOLOFT) 100 MG tablet Take 100 mg by mouth daily.     No current facility-administered medications for this visit.    Allergies:   Patient has no known allergies.   Social History:  The patient  reports that he has quit smoking. He has never used smokeless tobacco.   Family History:  The patient's family history includes Diabetes in his maternal grandfather and maternal grandmother.  ROS:  Please see the history of present illness.  All other systems are reviewed and otherwise negative.   PHYSICAL EXAM:  VS:  BP 94/76   Pulse 60   Wt 120 lb (54.4 kg)   BMI 17.22 kg/m  BMI: Body mass index is 17.22 kg/m.  Repeat BP 108/76 Well nourished, thin, chronically ill appearing, in no acute distress  HEENT: normocephalic, atraumatic  Neck: no JVD, carotid bruits or masses Cardiac:  RRR; no significant murmurs, no rubs, or gallops Lungs:  CTA b/l, no wheezing, rhonchi or rales  Abd: soft, nontender, PPM site is L abdomen well healed, no erythema, tenderness or signs of infection MS: R below the elbow UE amputation, b/l BKS'a Ext: LUE intact  Skin: warm and dry, no rash Neuro:  No gross deficits appreciated Psych: euthymic mood, full affect  L chest PPM extraction site is stable, ry scabs noted, no bleeding, drainage, healing well.  (they describe healed by secondary intention with wound care over weeks), appears clean, no signs of infection, is nontender   EKG:  Done today and reviewed by myself shows  Asynchronous V pacing   PPM  interrogation done today and reviewed by myself:  Battery and lead measurements are good He has VS rhythm of about 40bpm underlying VP 99.7% Lead remains at acute implant output of 3.5V No programming changes made    Recent Labs: No results found for requested labs within last 8760 hours.  No results found for requested labs within last 8760 hours.   CrCl cannot be calculated (Patient's most recent lab result is older than the maximum 21 days allowed.).   Wt Readings from Last 3 Encounters:  04/20/20 120 lb (54.4 kg)  01/02/18 144 lb (65.3 kg)  12/31/17 148 lb (67.1 kg)     Other studies reviewed: Additional studies/records reviewed today include: summarized above  ASSESSMENT AND PLAN:  1. PPM     Intact function     Single chamber PPM, endocardial leaed  The patient/device was enrolled into our [facility] system, and home  transmitter ordered and scheduled for our remote clinic Will have him back in a couple months to ensure completed and reduce acute RV lead outputs  2. H/o endocarditis, TVR     Recurrent endocarditis very prolonged complicated course as discussed above     TV not re operated on     Discussed importance of never using drugs again, he seems motivated      Will have him get re-established with general cardiology  Further [place] records have been requested    Disposition: F/u with   Current medicines are reviewed at length with the patient today.  The patient did not have any concerns regarding medicines  Signed, Francis Dowse, PA-C 04/20/2020 5:31 PM     [facility] [address] [place] [address] [phone] (office)  [phone] (fax)

## 2020-04-20 ENCOUNTER — Ambulatory Visit (INDEPENDENT_AMBULATORY_CARE_PROVIDER_SITE_OTHER): Payer: Medicaid Other | Admitting: Physician Assistant

## 2020-04-20 ENCOUNTER — Other Ambulatory Visit: Payer: Self-pay

## 2020-04-20 VITALS — BP 94/76 | HR 60 | Wt 120.0 lb

## 2020-04-20 DIAGNOSIS — I442 Atrioventricular block, complete: Secondary | ICD-10-CM

## 2020-04-20 DIAGNOSIS — Z950 Presence of cardiac pacemaker: Secondary | ICD-10-CM | POA: Diagnosis not present

## 2020-04-20 DIAGNOSIS — Z953 Presence of xenogenic heart valve: Secondary | ICD-10-CM | POA: Diagnosis not present

## 2020-04-20 NOTE — Patient Instructions (Signed)
Medication Instructions:   Your physician recommends that you continue on your current medications as directed. Please refer to the Current Medication list given to you today.  *If you need a refill on your cardiac medications before your next appointment, please call your pharmacy*   Lab Work: NONE ORDERED  TODAY   If you have labs (blood work) drawn today and your tests are completely normal, you will receive your results only by: Marland Kitchen MyChart Message (if you have MyChart) OR . A paper copy in the mail If you have any lab test that is abnormal or we need to change your treatment, we will call you to review the results.   Testing/Procedures:  NONE ORDERED  TODAY   Follow-Up: At [facility], you and your health needs are our priority.  As part of our continuing mission to provide you with exceptional heart care, we have created designated Provider Care Teams.  These Care Teams include your primary Cardiologist (physician) and Advanced Practice Providers (APPs -  Physician Assistants and Nurse Practitioners) who all work together to provide you with the care you need, when you need it.  We recommend signing up for the patient portal called "MyChart".  Sign up information is provided on this After Visit Summary.  MyChart is used to connect with patients for Virtual Visits (Telemedicine).  Patients are able to view lab/test results, encounter notes, upcoming appointments, etc.  Non-urgent messages can be sent to your provider as well.   To learn more about what you can do with MyChart, go to [email or web address].    Your next appointment:  In Person You may see  Dr.Hochrein / APP  As soon as possible to establish care   Dr Graciela Husbands June or July    Other Instructions

## 2020-04-21 ENCOUNTER — Ambulatory Visit: Payer: Medicaid Other | Attending: Internal Medicine

## 2020-04-21 DIAGNOSIS — Z23 Encounter for immunization: Secondary | ICD-10-CM

## 2020-04-21 NOTE — Progress Notes (Signed)
   Covid-19 Vaccination Clinic  Name:  Wesley Fuentes    MRN: [id number] DOB: 1976/08/06  04/21/2020  [name] was observed post Covid-19 immunization for 15 minutes without incident. He was provided with Vaccine Information Sheet and instruction to access the V-Safe system.   [name] was instructed to call 911 with any severe reactions post vaccine: Marland Kitchen Difficulty breathing  . Swelling of face and throat  . A fast heartbeat  . A bad rash all over body  . Dizziness and weakness   Immunizations Administered    Name Date Dose VIS Date Route   Pfizer COVID-19 Vaccine 04/21/2020 12:23 PM 0.3 mL 02/23/2019 Intramuscular   Manufacturer: [employer], [employer]   Lot: [id number]   NDC: [address]-8

## 2020-04-27 NOTE — Patient Instructions (Addendum)
It was great to see you!  Our plans for today:  - Today we focused on gathering information from the past medical history as well as her current medications. -Once your medical records arrive we will determine if additional blood work is needed. -I have submitted a referral for physical therapy at your request -I have also increased your Zoloft to 150 mg/day. -I recommend that you sign up for my chart as this is an easy way to communicate with me going forward and I am happy to see you back at any time and help any way that I can.  Take care and seek immediate care sooner if you develop any concerns.   Dr. [facility]

## 2020-04-27 NOTE — Progress Notes (Signed)
   Subjective:    Patient ID: Wesley Fuentes, male    DOB: [birth year], 44 y.o.   MRN: [id number]   CC: Establish care  HPI:  Wesley Fuentes is a very pleasant 44 y.o. male who presents today to establish care.  Initial concerns: Had 2400cc fluid drained during paracentesis few months, unsure if he may need this again in the future. Patient states this fluid was due to the his heart valve.   Depression:  Patient also states he has a history of depression, currently on Zoloft 100 mg/day.  States he has been doing pretty well overall but does think he would benefit from a slightly higher dose.  Denies suicidal/homicidal ideation.  Past medical history: Recently moved to the area. Complete heart block status post pacemaker 2018.  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 Was initially sent home on hospice but returned to hospital leading to the amputations. Patient ended up having infection at his pacemaker with visible erosion on his chest, leading to pacemaker removal and replacement.   Past surgical history: S/p pacemaker 2018.  Tricuspid valve replacement 2018.  Bilateral below-knee amputations in 2020.  Hand amputation in 2020  Medication allergies:vancomycin if administered quickly, okay if administered slowly.   Family history: Maternal grandmother and grandfather w/ diabetes, mother cervical cancer.   Social history:1/2 ppd x 5 years, quit 1 year ago. Still vapes daily. No alcohol, some marijuana.   History of heroine use, none now.   ROS: pertinent noted in the HPI   Objective:  BP 102/60   Pulse 64   Wt 115 lb (52.2 kg)   SpO2 90% Comment: per patient this is his baseline  BMI 16.50 kg/m     Office Visit from 05/02/2020 in [facility]  PHQ-9 Total Score  11      Vitals and nursing note  reviewed  General: NAD, pleasant, able to participate in exam Cardiac: Regular rhythm but murmur present.  Multiple scars on patient's chest from prior surgeries/pacemaker Respiratory: CTAB Extremities: no edema Abdomen: Bowel sounds present, nontender, mild distention, fluid wave not present, umbilical hernia present Skin: warm and dry, no rashes noted Neuro: alert, no obvious focal deficits Psych: Normal affect and mood   Assessment & Plan:    Depression Assessment: Depression with PHQ-9 score of 11 today.  Patient currently on Zoloft 100 and has been for some time.  States that he feels he would do better on a slightly increased dose. Plan: -We will increase Zoloft to 100 mg/day   Plans to follow up with cardiologist on 5/14.  Also plan to check labs including diabetes screen and hyperlipidemia screen once received the bulk of the patient's medical records.  [name], DO [facility] PGY-1

## 2020-04-28 ENCOUNTER — Telehealth: Payer: Self-pay | Admitting: Cardiology

## 2020-04-28 NOTE — Telephone Encounter (Signed)
Medical records requested from [facility]. 04/28/20 vlm

## 2020-05-02 ENCOUNTER — Encounter: Payer: Self-pay | Admitting: Family Medicine

## 2020-05-02 ENCOUNTER — Ambulatory Visit (INDEPENDENT_AMBULATORY_CARE_PROVIDER_SITE_OTHER): Payer: Medicaid Other | Admitting: Family Medicine

## 2020-05-02 ENCOUNTER — Other Ambulatory Visit: Payer: Self-pay

## 2020-05-02 VITALS — BP 102/60 | HR 64 | Wt 115.0 lb

## 2020-05-02 DIAGNOSIS — Z89511 Acquired absence of right leg below knee: Secondary | ICD-10-CM | POA: Diagnosis not present

## 2020-05-02 DIAGNOSIS — F32A Depression, unspecified: Secondary | ICD-10-CM

## 2020-05-02 DIAGNOSIS — F329 Major depressive disorder, single episode, unspecified: Secondary | ICD-10-CM

## 2020-05-02 DIAGNOSIS — Z8679 Personal history of other diseases of the circulatory system: Secondary | ICD-10-CM

## 2020-05-02 DIAGNOSIS — Z89512 Acquired absence of left leg below knee: Secondary | ICD-10-CM | POA: Diagnosis not present

## 2020-05-02 MED ORDER — SERTRALINE HCL 100 MG PO TABS: 150.0000 mg | ORAL_TABLET | Freq: Every day | ORAL | 1 refills | Status: DC

## 2020-05-02 NOTE — Assessment & Plan Note (Signed)
Assessment: Depression with PHQ-9 score of 11 today.  Patient currently on Zoloft 100 and has been for some time.  States that he feels he would do better on a slightly increased dose. Plan: -We will increase Zoloft to 100 mg/day

## 2020-05-04 ENCOUNTER — Other Ambulatory Visit: Payer: Self-pay

## 2020-05-04 ENCOUNTER — Encounter: Payer: Self-pay | Admitting: *Deleted

## 2020-05-04 MED ORDER — BISACODYL 5 MG PO TBEC: 5.0000 mg | DELAYED_RELEASE_TABLET | Freq: Two times a day (BID) | ORAL | 1 refills | Status: DC

## 2020-05-08 ENCOUNTER — Telehealth: Payer: Self-pay | Admitting: Family Medicine

## 2020-05-08 NOTE — Telephone Encounter (Signed)
Clinical info completed on [facility]  form.  Place form in PCP's box for completion.  Aquilla Solian, CMA

## 2020-05-08 NOTE — Telephone Encounter (Signed)
Handicap sticker form dropped off for at front desk for completion.  Verified that patient section of form has been completed.  Last DOS/WCC with PCP was 05-02-2020.  Placed form in [place] team folder to be completed by clinical staff.  Rockie Neighbours

## 2020-05-09 ENCOUNTER — Encounter: Payer: Self-pay | Admitting: Family Medicine

## 2020-05-09 DIAGNOSIS — Z89512 Acquired absence of left leg below knee: Secondary | ICD-10-CM | POA: Insufficient documentation

## 2020-05-09 DIAGNOSIS — S48911A Complete traumatic amputation of right shoulder and upper arm, level unspecified, initial encounter: Secondary | ICD-10-CM | POA: Insufficient documentation

## 2020-05-09 DIAGNOSIS — Z89511 Acquired absence of right leg below knee: Secondary | ICD-10-CM | POA: Insufficient documentation

## 2020-05-09 NOTE — Telephone Encounter (Signed)
Faxed form to appropriate number and made a copy for batch scanning. A copy was left up front for patient, they are aware.

## 2020-05-09 NOTE — Telephone Encounter (Signed)
Form given to RN team.

## 2020-05-10 ENCOUNTER — Encounter: Payer: Self-pay | Admitting: Physical Therapy

## 2020-05-10 ENCOUNTER — Ambulatory Visit: Payer: Medicaid Other | Attending: Family Medicine | Admitting: Physical Therapy

## 2020-05-10 ENCOUNTER — Other Ambulatory Visit: Payer: Self-pay

## 2020-05-10 DIAGNOSIS — M25631 Stiffness of right wrist, not elsewhere classified: Secondary | ICD-10-CM | POA: Diagnosis present

## 2020-05-10 DIAGNOSIS — R531 Weakness: Secondary | ICD-10-CM | POA: Insufficient documentation

## 2020-05-10 NOTE — Telephone Encounter (Signed)
Informed pts mother that forms were ready for pick at the front desk.. pts mother understood and stated she will pick it up tomorrow. Aquilla Solian, CMA

## 2020-05-10 NOTE — Therapy (Signed)
[facility] [address] [place], [place], [address] Phone: [phone]   Fax:  [phone]  Physical Therapy Evaluation  Patient Details  Name: Wesley Fuentes MRN: [id number] Date of Birth: [birth year] Referring Provider (PT): Janit Pagan, MD   Encounter Date: 05/10/2020  PT End of Session - 05/10/20 2148    Visit Number  1    Number of Visits  1    Authorization Type  Medicaid Advantage Out of State    PT Start Time  1157    PT Stop Time  1235    PT Time Calculation (min)  38 min    Activity Tolerance  Patient tolerated treatment well    Behavior During Therapy  [facility] for tasks assessed/performed       Past Medical History:  Diagnosis Date  . CHB (complete heart block) (HCC)   . Depression 12/11/2017  . Hepatitis C   . History of complete heart block 12/11/2017  . 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 Opioid use disorder, moderate, in early remission (HCC) 12/11/2017    Past Surgical History:  Procedure Laterality Date  . Permanent pacemaker placement     On 11/24/17-had [facility] Jude dual-chamber permanent pacemaker placed.  . Tricuspid valve replacement     On 11/17/17-replacement of tricuspid valve with a 29 mm Medtronic Mosaic mitral prosthesis.    There were no vitals filed for this visit.   Subjective Assessment - 05/10/20 1202    Subjective  This 43yo male was referred to PT on 05/02/2020 by Janit Pagan, MD with bilateral Transtibial Amputations. 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    Patient is accompained by:  Family member   mother   Pertinent History  Complete heart block, pacemaker 2018, Tricuspid valve replacements 2018, Bilateral TTAs & hand amputation 2020. Hep C,    Limitations  Standing;Walking;House hold activities    Patient Stated Goals  To get  prostheses to resume active life style    Currently in Pain?  No/denies         [facility] PT Assessment - 05/10/20 1200      Assessment   Medical Diagnosis  Bilateral Transtibial Amputations & Right hand amputations    Referring Provider (PT)  Janit Pagan, MD    Onset Date/Surgical Date  05/02/20   MD referral to PT   Hand Dominance  Right    Prior Therapy  minimal PT in hospital       Precautions   Precautions  Fall;Other (comment)   cardiac      Balance Screen   Has the patient fallen in the past 6 months  No    Has the patient had a decrease in activity level because of a fear of falling?   Yes    Is the patient reluctant to leave their home because of a fear of falling?   No   uses w/c     Home Public house manager residence    Chemical engineer;Other relatives   mother, step-father, adult brother   Type of Home  House    Home Access  Stairs to enter   temporary ramps over 4 steps   Entrance Stairs-Number of Steps  4    Entrance Stairs-Rails  Right;Left;Can reach both    Home Layout  One level    Home Equipment  Walker - 4 wheels;Shower seat;Wheelchair - manual      Prior Function   Level of Independence  Independent;Independent with household mobility without device;Independent with community mobility without device    Vocation  On disability    Leisure  fishing, time with 15yo son, camping/hiking, outdoors      Posture/Postural Control   Posture/Postural Control  Postural limitations    Postural Limitations  Rounded Shoulders;Decreased lumbar lordosis      ROM / Strength   AROM / PROM / Strength  PROM;Strength      PROM   Overall PROM   Deficits    Overall PROM Comments  bil shoulders WFL    Right Elbow Extension  -15    Right Forearm Pronation  68 Degrees    Right Forearm Supination  32 Degrees    Right Hip Extension  0   supine Thomas position   Right Hip ABduction  40   supine   Left Hip Extension  0   supine Thomas  position   Left Hip ABduction  40   supine   Right Knee Extension  0    Right Knee Flexion  100    Left Knee Extension  0    Left Knee Flexion  100      Strength   Overall Strength  Deficits    Overall Strength Comments  BUEs shoulders & elbows, left grip gross testing 5/5    Right Hip Flexion  4/5    Right Hip Extension  3/5    Right Hip ABduction  4/5    Left Hip Flexion  4/5    Left Hip Extension  3/5    Left Hip ABduction  4/5    Right Knee Flexion  4/5    Right Knee Extension  4/5    Left Knee Flexion  4/5    Left Knee Extension  4/5      Transfers   Transfers  Anterior-Posterior Transfer    Anterior-Posterior Transfer  6: Modified independent (Device/Increase time);To level surface      Ambulation/Gait   Ambulation/Gait  No      Balance   Balance Assessed  Yes      Static Sitting Balance   Static Sitting - Balance Support  No upper extremity supported   long sitting on mat table without back support   Static Sitting - Level of Assistance  6: Modified independent (Device/Increase time)    Static Sitting - Comment/# of Minutes  >2 minutes      Dynamic Sitting Balance   Dynamic Sitting - Balance Support  No upper extremity supported   long sitting on mat table   Dynamic Sitting - Level of Assistance  5: Stand by assistance    Dynamic Sitting balance - Comments  reaches forward 5" (abdominal pain from umbilica hernia limiting)      Prosthetics Assessment - 05/10/20 1200      Prosthetics   Edema  none noted    Residual limb condition   bilateral TTA limbs healed completely, dry skin distally, normal color & temperature, cylinderical shape except left has medial skin flare and right has lateral skin flare                 Objective measurements completed on examination: See above findings.              PT Education - 05/10/20 2144    Education Details  right forearm pronation & supination AROM with overpressure stretch,  power w/c vs manual  w/one arm drive and prostheses with insurance issues    Person(s) Educated  Patient;Parent(s)    Methods  Explanation;Demonstration;Verbal cues    Comprehension  Verbalized understanding                  Plan - 05/10/20 2151    Clinical Impression Statement  PT evaluation for prosthetic prescription performed.  Patient has range, strength, sitting balance & transfers indicating that he would be benefit from prostheses for bilateral Transtibial Amputations & right dominant UE Transradial amputation.  PT recommends that Transtibial Amputation prostheses with silicon liners with shuttle pin locks and flexible keel feet. He also would benefit from upper extremity prosthesis for dominant hand. He also needs a wheelchair that is one arm drive but may need to be custom with wieght & his cardiac issues.  He will need PT for prosthetic training once he recieves his prostheses. PT recommends not using his Medicaid visits (they limit to 27 per year) pre-prosthetically but save all visits for prosthetic training.  PT also recommends consult with Aldean Baker, MD who specializes in Amputee Care. Due to extent of his amputations, Dr. Lajoyce Corners would be an excellent resource to manage his amputee care / prosthetic prescriptions.    Personal Factors and Comorbidities  Comorbidity 3+    Comorbidities  Complete heart block, pacemaker 2018, Tricuspid valve replacements 2018, Bilateral TTAs & hand amputation 2020. Hep C,    Examination-Activity Limitations  Locomotion Level;Stand;Transfers    Stability/Clinical Decision Making  Evolving/Moderate complexity    Clinical Decision Making  Low    PT Frequency  One time visit    PT Next Visit Plan  PT evaluation once recieves his TTA prostheses, OT evaluation once recieves UE prosthesis, w/c evaluation for custom one-arm drive light weight w/c.    Recommended Other Services  Referal to Aldean Baker, MD for consult. Patient in agreement.  MD prescription FAXd to [facility] [phone]  for bilateral BKA prostheses & right BEA prosthesis.    [facility] w/c division consult    Consulted and Agree with Plan of Care  Patient;Family member/caregiver    Family Member Consulted  mother, Abigail Butts       Patient will benefit from skilled therapeutic intervention in order to improve the following deficits and impairments:     Visit Diagnosis: Weakness generalized  Stiffness of joint of right forearm     Problem List Patient Active Problem List   Diagnosis Date Noted  . S/P bilateral below knee amputation (HCC) 05/09/2020  . 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 History of bacterial endocarditis 05/02/2020  . 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 Complete heart block (HCC) 12/11/2017  . 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 Opioid use disorder, moderate, in early remission (HCC) 12/11/2017  . Depression 12/11/2017    Vladimir Faster PT, DPT 05/10/2020, 10:16 PM  [facility] [address] [place], [place], [address] Phone: [phone]   Fax:  [phone]  Name: Wesley Fuentes MRN: [id number] Date of Birth: 1976/04/28

## 2020-05-10 NOTE — Telephone Encounter (Signed)
Clinical info completed on CAP form.  Place form in Dr. Vergie Living box for completion.  Abundio Teuscher, CMA

## 2020-05-10 NOTE — Telephone Encounter (Signed)
Patients mother came to pick up form. She said it needs to state on the second page that he needs a motorized wheel chair. Patient left form for this to be updated and would like someone to call her when it has been done and ready to be picked up. She would like to do this today if possible.  I have placed form back into the blue team folder.

## 2020-05-10 NOTE — Telephone Encounter (Signed)
Completed and placed in nurse box. 

## 2020-05-11 DIAGNOSIS — Z7189 Other specified counseling: Secondary | ICD-10-CM | POA: Insufficient documentation

## 2020-05-11 DIAGNOSIS — Z954 Presence of other heart-valve replacement: Secondary | ICD-10-CM | POA: Insufficient documentation

## 2020-05-11 NOTE — Progress Notes (Deleted)
Cardiology Office Note   Date:  05/11/2020   ID:  Wesley Fuentes, DOB Nov 08, 1976, MRN [id number]  PCP:  Wesley Del, DO  Cardiologist:   No primary care provider on file. Referring:  Wesley Del, DO  No chief complaint on file.     History of Present Illness: Wesley Fuentes is a 44 y.o. male who is referred by Wesley Del, DO for follow up of tricuspid valve replacement and pacemaker placement.     He presented for follow up with a history of SBE.  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     I saw him in 2018 after he moved from  [place].  He has a history of IV drug abuse and he developed meth resistant staph aureus. He had septic emboli or vegetation on the tricuspid valve but no other valvular involvement.  He underwent valve replacement with a 27 mm Mosaic prosthetic mitral valve.  Postop he had complete heart block that did not resolve so he underwent Wesley Fuentes  pacemaker placement.  He had moved to [place], while there unfortunately relapsed back to heroine.  He was hospitalized in August 2020 with endocarditis, complicated with septic emboli to bilateral lower and right upper extremities, lungs and spleen.  He had DIC and AKI.  *** .   LE, and RUE, b.l lungs and spleen, he had DIC, AKI.  He had bilateral LE and RUE amputations, he developed a right IJ DVT.  He had a post op hematoma.  He had PPM site errosion.  with MRSA.  He underwent pacer extraction and reimplantation at an abdominal site.  Other complications included tricuspid regurgitation (not thought to be a operative candidate and subsequent ascites requiring paracentesis.    He moved back here to live with his aunt.  He reestablished with our pacemaker clinic.  ***    Past Medical History:  Diagnosis Date  . CHB (complete heart block) ([place])   . Depression 12/11/2017  . Hepatitis C   .  History of complete heart block 12/11/2017  . 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 Opioid use disorder, moderate, in early remission ([place]) 12/11/2017    Past Surgical History:  Procedure Laterality Date  . Permanent pacemaker placement     On 11/24/17-had Wesley Fuentes Wesley Fuentes dual-chamber permanent pacemaker placed.  . Tricuspid valve replacement     On 11/17/17-replacement of tricuspid valve with a 29 mm Medtronic Mosaic mitral prosthesis.     Current Outpatient Medications  Medication Sig Dispense Refill  . Acetaminophen 325 MG CAPS Take 325 mg by mouth every 6 (six) hours as needed.    . Ascorbic Acid (VITAMIN C WITH ROSE HIPS) 500 MG tablet Take 500 mg by mouth daily.    . bisacodyl (DULCOLAX) 5 MG EC tablet Take 1 tablet (5 mg total) by mouth 2 (two) times daily. 30 tablet 1  . bisacodyl (FLEET) 10 MG/30ML ENEM Place 10 mg rectally as needed.    . famotidine (PEPCID) 20 MG tablet Take 20 mg by mouth 2 (two) times daily.    . ferrous sulfate 325 (65 FE) MG tablet Take 325 mg by mouth daily with breakfast.    . furosemide (LASIX) 40 MG tablet Take 40 mg by mouth.    . methadone (DOLOPHINE) 10 MG tablet Take 10 mg by mouth every 8 (eight) hours as  needed.    . minocycline (DYNACIN) 100 MG tablet Take 100 mg by mouth 2 (two) times daily.    . pantoprazole (PROTONIX) 40 MG tablet Take 40 mg by mouth daily.    . rivaroxaban (XARELTO) 20 MG TABS tablet Take 20 mg by mouth daily with supper.    . senna (SENNA-LAX) 8.6 MG tablet Take 1 tablet by mouth 2 (two) times daily.    . sertraline (ZOLOFT) 100 MG tablet Take 1.5 tablets (150 mg total) by mouth daily. 135 tablet 1   No current facility-administered medications for this visit.    Allergies:   Patient has no known allergies.    Social History:  The patient  reports that he has quit smoking. He has never used smokeless tobacco.   Family History:  The patient's family history includes Cancer in his  maternal grandfather; Cervical cancer in his mother; Diabetes in his maternal grandfather and maternal grandmother.    ROS:  Please see the history of present illness.   Otherwise, review of systems are positive for none.   All other systems are reviewed and negative.    PHYSICAL EXAM: VS:  There were no vitals taken for this visit. , BMI There is no height or weight on file to calculate BMI. GENERAL:  Well appearing HEENT:  Pupils equal round and reactive, fundi not visualized, oral mucosa unremarkable NECK:  No jugular venous distention, waveform within normal limits, carotid upstroke brisk and symmetric, no bruits, no thyromegaly LYMPHATICS:  No cervical, inguinal adenopathy LUNGS:  Clear to auscultation bilaterally CHEST:  Well healed sternotomy scar.  Well-healed pacemaker pocket HEART:  PMI not displaced or sustained,S1 and S2 within normal limits, no S3, no S4, no clicks, no rubs, no murmurs ABD:  Flat, positive bowel sounds normal in frequency in pitch, no bruits, no rebound, no guarding, no midline pulsatile mass, no hepatomegaly, no splenomegaly EXT:  2 plus pulses throughout, no edema, no cyanosis no clubbing SKIN:  No rashes no nodules NEURO:  Cranial nerves II through XII grossly intact, motor grossly intact throughout PSYCH:  Cognitively intact, oriented to person place and time    EKG:  EKG is ordered today. The ekg ordered today demonstrates sinus rhythm, rate 73, left bundle branch block, no acute ST-T wave changes.   Recent Labs: No results found for requested labs within last 8760 hours.    Lipid Panel No results found for: CHOL, TRIG, HDL, CHOLHDL, VLDL, LDLCALC, LDLDIRECT    Wt Readings from Last 3 Encounters:  05/02/20 115 lb (52.2 kg)  04/20/20 120 lb (54.4 kg)  01/02/18 144 lb (65.3 kg)      Other studies Reviewed: Additional studies/ records that were reviewed today include: Hundreds of pages of records from other hospitals.  . Review of the above  records demonstrates:  Please see elsewhere in the note.     ASSESSMENT AND PLAN:  TVR: ***  Clinically he looks well from this.  We talked about endocarditis as prophylaxis and good dental hygiene.  No further imaging is indicated at this point.  I will follow this clinically and with repeat echoes in the future.  PPM:  ***  I will have him establish in our pacemaker clinic.  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  I did call the [facility] rep.  We talked about this.  He has  ventricular pacing on this most recent EKG.  He was 87% V paced previously.  We can look further at this and whether he is indeed pacemaker dependent.  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  I think it would be preferable to his sobriety to get him back to this career which he loves if possible.  I will send him to the EP clinic for further consideration.  COVID EDUCATION:  ***    Current medicines are reviewed at length with the patient today.  The patient does not have concerns regarding medicines.  The following changes have been made:  no change  Labs/ tests ordered today include: None  No orders of the defined types were placed in this encounter.    Disposition:   FU with me in six months.      Signed, Rollene Rotunda, MD  05/11/2020 9:00 PM    [facility]

## 2020-05-12 ENCOUNTER — Ambulatory Visit: Payer: Medicaid Other | Admitting: Cardiology

## 2020-05-16 ENCOUNTER — Ambulatory Visit: Payer: Medicaid Other | Attending: Internal Medicine

## 2020-05-16 DIAGNOSIS — Z23 Encounter for immunization: Secondary | ICD-10-CM

## 2020-05-16 NOTE — Progress Notes (Signed)
   Covid-19 Vaccination Clinic  Name:  VERN GUERETTE    MRN: [id number] DOB: [birth year]  05/16/2020  Mr. Carreno was observed post Covid-19 immunization for 15 minutes without incident. He was provided with Vaccine Information Sheet and instruction to access the V-Safe system.   Mr. Rehm was instructed to call 911 with any severe reactions post vaccine: Marland Kitchen Difficulty breathing  . Swelling of face and throat  . A fast heartbeat  . A bad rash all over body  . Dizziness and weakness   Immunizations Administered    Name Date Dose VIS Date Route   Pfizer COVID-19 Vaccine 05/16/2020  1:55 PM 0.3 mL 02/23/2019 Intramuscular   Manufacturer: [employer], [employer]   Lot: [id number]   NDC: [address]-1

## 2020-05-17 ENCOUNTER — Other Ambulatory Visit: Payer: Self-pay | Admitting: Family Medicine

## 2020-05-17 NOTE — Telephone Encounter (Signed)
Patient is needing refills on  Ascorbic Acid Certavite- Antioxidant Famotidine Ferrous Sulfate Furosemide Minocycline Pantoprazole Senna Lax Xarelto Thank you.

## 2020-05-17 NOTE — Telephone Encounter (Signed)
Mother came in stating that patient needs a refill of these medications.  Will forward to MD. Burnard Hawthorne

## 2020-05-18 ENCOUNTER — Other Ambulatory Visit: Payer: Self-pay | Admitting: Family Medicine

## 2020-05-18 MED ORDER — PANTOPRAZOLE SODIUM 40 MG PO TBEC: 40.0000 mg | DELAYED_RELEASE_TABLET | Freq: Every day | ORAL | 1 refills | Status: DC

## 2020-05-18 MED ORDER — FAMOTIDINE 20 MG PO TABS: 20.0000 mg | ORAL_TABLET | Freq: Two times a day (BID) | ORAL | 1 refills | Status: DC

## 2020-05-18 MED ORDER — VITAMIN C-ACEROLA 500 MG PO TABS: 500.0000 mg | ORAL_TABLET | Freq: Every day | ORAL | 1 refills | Status: DC

## 2020-05-18 MED ORDER — FERROUS SULFATE 325 (65 FE) MG PO TABS: 325.0000 mg | ORAL_TABLET | Freq: Every day | ORAL | 0 refills | Status: DC

## 2020-05-18 MED ORDER — SENNOSIDES 8.6 MG PO TABS: 1.0000 | ORAL_TABLET | Freq: Two times a day (BID) | ORAL | 1 refills | Status: DC

## 2020-05-18 MED ORDER — RIVAROXABAN 20 MG PO TABS: 20.0000 mg | ORAL_TABLET | Freq: Every day | ORAL | 1 refills | Status: DC

## 2020-05-18 NOTE — Telephone Encounter (Signed)
Called patient and discussed his refill requests.  Patient states he has not been taking his Lasix in the past few days and is not sure if he was indicated to continue it, to increase or to decrease the dosage from cardiology previously.  Patient fortunately has a scheduled appointment to establish care with cardiology here in [facility] tomorrow.  Patient plans to discuss his Lasix medication and dosing with cardiology during his appointment tomorrow to determine if this is still indicated from their standpoint, and what dosage he should be taking.  Other refill request provided.  Patient should continue Xarelto through end of July per previous discharge summary.

## 2020-05-18 NOTE — Progress Notes (Signed)
Cardiology Office Note   Date:  05/19/2020   ID:  Wesley Fuentes, DOB 13-Jul-1976, MRN [id number]  PCP:  Lurline Del, DO  Cardiologist:   No primary care provider on file. Referring:  Lurline Del, DO  Chief Complaint  Patient presents with  . Tricuspid Regurgitation      History of Present Illness: Wesley Fuentes is a 44 y.o. male who is referred by Lurline Del, DO for follow up of tricuspid valve replacement and pacemaker placement.     He presented for follow up with a history of SBE.  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     I saw him in 2018 after he moved from  [place].  He has a history of IV drug abuse and he developed meth resistant staph aureus. He had septic emboli or vegetation on the tricuspid valve but no other valvular involvement.  He underwent valve replacement with a 27 mm Mosaic prosthetic tricuspic valve.  Postop he had complete heart block that did not resolve so he underwent [facility]  pacemaker placement.  He had moved to [place], while there unfortunately relapsed back to heroine.  He was hospitalized in August 2020 with endocarditis, complicated with septic emboli to bilateral lower and right upper extremities, lungs and spleen.  He had DIC and AKI.  He had bilateral LE and RUE amputations, he had multiple other complications.  He developed a right IJ DVT.  He had a post op hematoma.  He had PPM site errosion with MRSA.  He underwent pacer extraction and reimplantation at an abdominal site.  Other complications included tricuspid regurgitation (not thought to be a operative candidate) and subsequent ascites requiring paracentesis.    He moved back here to live with his mother.  He reestablished with our pacemaker clinic.  He has had been getting around in a wheelchair.  He was working with physical therapy.  He is not having any new  shortness of breath, PND or orthopnea.  He has no palpitations, presyncope or syncope.  He does have some discomfort associated with an abdominal umbilical hernia.  Has a little abdominal distention.  He is not had any fevers or chills.  He denies any chest pain.   Past Medical History:  Diagnosis Date  . CHB (complete heart block) ([place])   . Depression 12/11/2017  . Hepatitis C   . History of complete heart block 12/11/2017  . 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 Opioid use disorder, moderate, in early remission ([place]) 12/11/2017    Past Surgical History:  Procedure Laterality Date  . Permanent pacemaker placement     On 11/24/17-had [facility] Jude dual-chamber permanent pacemaker placed.  . Tricuspid valve replacement     On 11/17/17-replacement of tricuspid valve with a 29 mm Medtronic Mosaic mitral prosthesis.     Current Outpatient Medications  Medication Sig Dispense Refill  . Acetaminophen 325 MG CAPS Take 325 mg by mouth every 6 (six) hours as needed.    . Ascorbic Acid (VITAMIN C WITH ROSE HIPS) 500 MG tablet Take 1 tablet (500 mg total) by mouth daily. 30 tablet 1  . bisacodyl (DULCOLAX) 5 MG EC tablet Take 1 tablet (5 mg total) by mouth 2 (two) times daily. 30 tablet 1  . bisacodyl (FLEET) 10 MG/30ML ENEM Place 10 mg rectally as needed.    Marland Kitchen  famotidine (PEPCID) 20 MG tablet Take 1 tablet (20 mg total) by mouth 2 (two) times daily. 60 tablet 1  . ferrous sulfate 325 (65 FE) MG tablet Take 1 tablet (325 mg total) by mouth daily with breakfast. 30 tablet 0  . furosemide (LASIX) 40 MG tablet Take 40 mg by mouth.    . methadone (DOLOPHINE) 10 MG tablet Take 10 mg by mouth every 8 (eight) hours as needed.    . minocycline (DYNACIN) 100 MG tablet Take 1 tablet (100 mg total) by mouth 2 (two) times daily. 180 tablet 3  . pantoprazole (PROTONIX) 40 MG tablet Take 1 tablet (40 mg total) by mouth daily. 30 tablet 1  . rivaroxaban (XARELTO) 20 MG  TABS tablet Take 1 tablet (20 mg total) by mouth daily with supper. 30 tablet 1  . senna (SENNA-LAX) 8.6 MG tablet Take 1 tablet (8.6 mg total) by mouth 2 (two) times daily. 60 tablet 1  . sertraline (ZOLOFT) 100 MG tablet Take 1.5 tablets (150 mg total) by mouth daily. 135 tablet 1   No current facility-administered medications for this visit.    Allergies:   Patient has no known allergies.    Social History:  The patient  reports that he has quit smoking. He has never used smokeless tobacco.   Family History:  The patient's family history includes Cancer in his maternal grandfather; Cervical cancer in his mother; Diabetes in his maternal grandfather and maternal grandmother.    ROS:  Please see the history of present illness.   Otherwise, review of systems are positive for none.   All other systems are reviewed and negative.    PHYSICAL EXAM: VS:  BP 108/68   Pulse 60   Ht '5\' 10"'$  (1.778 m)   Wt 115 lb (52.2 kg)   SpO2 92%   BMI 16.50 kg/m  , BMI Body mass index is 16.5 kg/m. GEN:  No distress NECK: Positive jugular venous distention at 90 degrees, CV wave, waveform within normal limits, carotid upstroke brisk and symmetric, no bruits, no thyromegaly LYMPHATICS:  No cervical adenopathy LUNGS:  Clear to auscultation bilaterally BACK:  No CVA tenderness CHEST:  Unremarkable HEART:  S1 and S2 within normal limits, no S3, no S4, no clicks, no rubs, 3 out of 6 systolic murmur increase during inspiration, holosystolic and heard at third left intercostal space, no diastolic murmurs ABD:  Positive bowel sounds normal in frequency in pitch, no bruits, no rebound, no guarding, unable to assess midline mass or bruit with the patient seated.,  Umbilical hernia.  Questionable fluid wave and mild distention EXT:  2 plus pulses throughout, moderate edema, no cyanosis no clubbing SKIN:  No rashes no nodules NEURO:  Cranial nerves II through XII grossly intact, motor grossly intact  throughout PSYCH:  Cognitively intact, oriented to person place and time   EKG:  EKG is not ordered today. The ekg ordered for 20-21 demonstrates sinus rhythm, rate 60, left bundle branch block, no acute ST-T wave changes.   Recent Labs: No results found for requested labs within last 8760 hours.    Lipid Panel No results found for: CHOL, TRIG, HDL, CHOLHDL, VLDL, LDLCALC, LDLDIRECT    Wt Readings from Last 3 Encounters:  05/19/20 115 lb (52.2 kg)  05/02/20 115 lb (52.2 kg)  04/20/20 120 lb (54.4 kg)      Other studies Reviewed: Additional studies/ records that were reviewed today include: Hundreds of pages of records from other hospitals.  . Review  of the above records demonstrates:  Please see elsewhere in the note.     ASSESSMENT AND PLAN:  TVR: I am going to start with an echocardiogram.  I am also going to look at an abdominal ultrasound to see if there is any evidence of ascites as he does have a little distention but it is difficult to examine with him in the wheelchair.  He understands endocarditis prophylaxis.  I will be further then deciding on the need for tricuspid valve based on whether there is evidence for ongoing hepatic congestion and ascites.  I will check blood work to include a c-Met and CBC.  Of note he is on long-term minocycline.  PPM: He is established in our pacemaker clinic and has up-to-date follow-up.  He has normal pacemaker function.    Current medicines are reviewed at length with the patient today.  The patient does not have concerns regarding medicines.  The following changes have been made:  no change  Labs/ tests ordered today include:  Orders Placed This Encounter  Procedures  . [place] ASCITES (ABDOMEN LIMITED)  . CBC  . Comprehensive metabolic panel  . ECHOCARDIOGRAM COMPLETE     Disposition:   FU with me after the above studies.    Signed, Minus Breeding, MD  05/19/2020 5:36 PM    [facility]

## 2020-05-18 NOTE — Telephone Encounter (Signed)
Mother calls nurse line stating she missed a phone call from [place]. She is also wanting to follow up on status of rx refill.   To PCP  Veronda Prude, RN

## 2020-05-19 ENCOUNTER — Encounter: Payer: Self-pay | Admitting: Cardiology

## 2020-05-19 ENCOUNTER — Ambulatory Visit (INDEPENDENT_AMBULATORY_CARE_PROVIDER_SITE_OTHER): Payer: Medicaid Other | Admitting: Cardiology

## 2020-05-19 ENCOUNTER — Other Ambulatory Visit: Payer: Self-pay

## 2020-05-19 VITALS — BP 108/68 | HR 60 | Ht 70.0 in | Wt 115.0 lb

## 2020-05-19 DIAGNOSIS — Z953 Presence of xenogenic heart valve: Secondary | ICD-10-CM

## 2020-05-19 DIAGNOSIS — R188 Other ascites: Secondary | ICD-10-CM

## 2020-05-19 MED ORDER — MINOCYCLINE HCL 100 MG PO TABS: 100.0000 mg | ORAL_TABLET | Freq: Two times a day (BID) | ORAL | 3 refills | Status: DC

## 2020-05-19 NOTE — Patient Instructions (Signed)
Medication Instructions:  REFILLED MINOCYCLINE *If you need a refill on your cardiac medications before your next appointment, please call your pharmacy*  Lab Work: Your physician recommends that you return for lab work (CBC, CMP)  Testing/Procedures: ABDOMINAL ULTRASOUND  Your physician has requested that you have an echocardiogram. Echocardiography is a painless test that uses sound waves to create images of your heart. It provides your doctor with information about the size and shape of your heart and how well your heart's chambers and valves are working. This procedure takes approximately one hour. There are no restrictions for this procedure. [address]  Follow-Up: At [facility], you and your health needs are our priority.  As part of our continuing mission to provide you with exceptional heart care, we have created designated Provider Care Teams.  These Care Teams include your primary Cardiologist (physician) and Advanced Practice Providers (APPs -  Physician Assistants and Nurse Practitioners) who all work together to provide you with the care you need, when you need it.  Your next appointment:   6 WEEKS You will receive a reminder letter in the mail two months in advance. If you don't receive a letter, please call our office to schedule the follow-up appointment.  The format for your next appointment:   In Person  Provider:   Rollene Rotunda, MD

## 2020-05-26 ENCOUNTER — Other Ambulatory Visit: Payer: Self-pay | Admitting: Family Medicine

## 2020-05-26 ENCOUNTER — Telehealth: Payer: Self-pay | Admitting: Family Medicine

## 2020-05-26 DIAGNOSIS — Z89511 Acquired absence of right leg below knee: Secondary | ICD-10-CM

## 2020-05-26 NOTE — Telephone Encounter (Signed)
Mom states son needs referral to [facility] patient was seen on 05/02/2020.  He saw Dr. Atha Starks and they discussed his need for prosthetic legs and arm.  [facility] address is [address] [place], [place] [address].  Phone #[phone]  Fax #[phone].  Needs referral form.

## 2020-05-26 NOTE — Telephone Encounter (Signed)
Referral placed for [facility]  [address] [place], [place] [address]. Phone #[phone]  Fax #[phone] for prosthetic evaluation   Dana Allan, MD [facility]

## 2020-05-30 ENCOUNTER — Telehealth: Payer: Self-pay | Admitting: Cardiology

## 2020-05-30 ENCOUNTER — Telehealth: Payer: Self-pay

## 2020-05-30 NOTE — Telephone Encounter (Signed)
Mother calls nurse line in regards to [facility] referral. Mom stated the referral put in last week was not received by [facility]. I called [facility] to see what was needed, as I did not believe a referral would be sufficient enough. Per Alvis Lemmings, a [facility] rep, they would need a very detailed list of what the patient would need, all diagnoses, sizes, supplies etc. Dawn and I decided that an evaluation by [facility] would be more appropriate for patient. Then [facility] would send [place] what is needed for provider to sign off on. All Dawn would need for this is last office notes. Office notes faxed to [facility]. Mother has been updated.

## 2020-05-30 NOTE — Telephone Encounter (Signed)
Left message for patient to call and schedule abdominal ultra sound and echo at [facility].

## 2020-05-31 NOTE — Telephone Encounter (Signed)
Left message for patient to call regarding appointment for abdominal ultra sound and echo scheduled at [facility] 06/14/20

## 2020-05-31 NOTE — Telephone Encounter (Signed)
Patient aware of appointments scheduled for Abdominal ultra sound and echocardiogram at [facility] 06/14/20---arrival time is 7:45 am at the 1st floor admissions office for ultrasound---NPO after midnight---echo to be done after completion of ultrasound.

## 2020-06-02 ENCOUNTER — Telehealth: Payer: Self-pay | Admitting: Cardiology

## 2020-06-02 NOTE — Telephone Encounter (Signed)
Returned call to patient's mother.  Patient on speaker phone as well.  Provided ICD-10 codes from first & last visit with Dr. Antoine Poche.

## 2020-06-02 NOTE — Telephone Encounter (Signed)
Spoke with patient's mom per DPR. She is requesting billing codes and ICD 9 or 10 codes that have been used for visits in order to fill out medicaid forms. Will send message to billing to assist with what visits have been coded under.

## 2020-06-02 NOTE — Telephone Encounter (Signed)
Britta Mccreedy the patient's Mother is calling requesting the ICD codes of his treatments he receives from this office for insurance purposes.  Please advise.

## 2020-06-07 ENCOUNTER — Telehealth: Payer: Self-pay | Admitting: Family Medicine

## 2020-06-07 ENCOUNTER — Ambulatory Visit (INDEPENDENT_AMBULATORY_CARE_PROVIDER_SITE_OTHER): Payer: Medicaid Other | Admitting: *Deleted

## 2020-06-07 DIAGNOSIS — I442 Atrioventricular block, complete: Secondary | ICD-10-CM

## 2020-06-07 LAB — CUP PACEART REMOTE DEVICE CHECK
Battery Remaining Longevity: 160 mo
Battery Voltage: 3.19 V
Brady Statistic RV Percent Paced: 99.97 %
Date Time Interrogation Session: 20210609023012
Implantable Lead Implant Date: 20210308
Implantable Lead Location: 753862
Implantable Lead Model: 4968
Implantable Pulse Generator Implant Date: 20210308
Lead Channel Impedance Value: 570 Ohm
Lead Channel Impedance Value: 741 Ohm
Lead Channel Pacing Threshold Amplitude: 0.875 V
Lead Channel Pacing Threshold Pulse Width: 0.4 ms
Lead Channel Sensing Intrinsic Amplitude: 6.5 mV
Lead Channel Sensing Intrinsic Amplitude: 6.5 mV
Lead Channel Setting Pacing Amplitude: 3.5 V
Lead Channel Setting Pacing Pulse Width: 0.4 ms
Lead Channel Setting Sensing Sensitivity: 1.2 mV

## 2020-06-07 NOTE — Telephone Encounter (Signed)
RX placed in provider's box for signature.  It seems to be for a prothestic.  Kamil Mchaffie,CMA

## 2020-06-07 NOTE — Telephone Encounter (Signed)
Sign prescriptions and mail back  form dropped off for at front desk for completion.  Verified that patient section of form has been completed.  Last DOS/WCC with PCP was  Placed form in05/04/21 team folder to be completed by clinical staff.  Grayce Fredda Hammed

## 2020-06-08 NOTE — Progress Notes (Signed)
Remote pacemaker transmission.   

## 2020-06-12 NOTE — Telephone Encounter (Signed)
Discussed with patient the prosthetic prescriptions he was requesting. 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 Patient states he is currently in a wheel chair but hopes to improve his functionality with the prosthetics in order to no longer require it. I discussed with him that in my medical opinion he does not have any comorbidities that would impact his ability to function with a prosthetic. Patient states he does not current have any prosthetic devices. Will sign patient's prescription requests as I feel these would benefit his healthcare and functionality.  [name], DO

## 2020-06-12 NOTE — Telephone Encounter (Signed)
Signed and placed in nurse box up front.

## 2020-06-13 NOTE — Telephone Encounter (Signed)
Mother made aware that order was mailed to [facility] per her request.  I also made a copy to be scanned into patient's chart.  Lamesha Tibbits,CMA

## 2020-06-14 ENCOUNTER — Other Ambulatory Visit: Payer: Self-pay

## 2020-06-14 ENCOUNTER — Ambulatory Visit (HOSPITAL_COMMUNITY): Payer: Medicaid Other

## 2020-06-14 ENCOUNTER — Ambulatory Visit (HOSPITAL_COMMUNITY)
Admission: RE | Admit: 2020-06-14 | Discharge: 2020-06-14 | Disposition: A | Discharge status: Home / Self Care | Payer: Medicaid Other | Source: Ambulatory Visit | Attending: Cardiology | Admitting: Cardiology

## 2020-06-14 DIAGNOSIS — R188 Other ascites: Secondary | ICD-10-CM | POA: Diagnosis not present

## 2020-06-20 ENCOUNTER — Encounter: Payer: Self-pay | Admitting: Internal Medicine

## 2020-06-20 ENCOUNTER — Other Ambulatory Visit: Payer: Self-pay

## 2020-06-20 ENCOUNTER — Ambulatory Visit (INDEPENDENT_AMBULATORY_CARE_PROVIDER_SITE_OTHER): Payer: Medicaid Other | Admitting: Internal Medicine

## 2020-06-20 VITALS — BP 98/58 | HR 60 | Ht 70.0 in | Wt 140.0 lb

## 2020-06-20 DIAGNOSIS — I442 Atrioventricular block, complete: Secondary | ICD-10-CM

## 2020-06-20 DIAGNOSIS — Z950 Presence of cardiac pacemaker: Secondary | ICD-10-CM

## 2020-06-20 NOTE — Progress Notes (Signed)
Patient Care Team: [name], DO as PCP - General (Family Medicine)   HPI  Wesley Fuentes is a 44 y.o. male seen to reestablish pacemaker follow-up.  Initially implanted 11/18 (transvenous) following tricuspid valve replacement because of endocarditis secondary to IV drug use and MRSA.  Postoperative complete heart block.  Heroin relapse 2020 complicated by recurrent endocarditis with septic emboli to his upper and lower extremities bilaterally lung and spleen.  He underwent bilateral lower extremity and right upper extremity amputation.  Further complicated by DIC and AKI.  Pacemaker site eroded with MRSA and underwent extraction and then reimplantation in an abdominal site.  He is now being fitted for lower extremity prostheses in her upper extremity prosthesis.  Has been wheelchair restricted.  Does not sleep well.   Records and Results Reviewed   Past Medical History:  Diagnosis Date  . CHB (complete heart block) ([facility])   . Depression 12/11/2017  . Hepatitis C   . History of complete heart block 12/11/2017  . 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 Opioid use disorder, moderate, in early remission ([facility]) 12/11/2017    Past Surgical History:  Procedure Laterality Date  . Permanent pacemaker placement     On 11/24/17-had [facility] Jude dual-chamber permanent pacemaker placed.  . Tricuspid valve replacement     On 11/17/17-replacement of tricuspid valve with a 29 mm Medtronic Mosaic mitral prosthesis.    Current Meds  Medication Sig  . Acetaminophen 325 MG CAPS Take 325 mg by mouth every 6 (six) hours as needed.  . Ascorbic Acid (VITAMIN C WITH ROSE HIPS) 500 MG tablet Take 1 tablet (500 mg total) by mouth daily.  . bisacodyl (DULCOLAX) 5 MG EC tablet Take 1 tablet (5 mg total) by mouth 2 (two) times daily.  . bisacodyl (FLEET) 10 MG/30ML ENEM Place 10 mg rectally as needed.  . famotidine (PEPCID) 20 MG tablet Take 1 tablet  (20 mg total) by mouth 2 (two) times daily.  . ferrous sulfate 325 (65 FE) MG tablet Take 1 tablet (325 mg total) by mouth daily with breakfast.  . methadone (DOLOPHINE) 10 MG tablet Take 10 mg by mouth every 8 (eight) hours as needed.  . minocycline (DYNACIN) 100 MG tablet Take 1 tablet (100 mg total) by mouth 2 (two) times daily.  . pantoprazole (PROTONIX) 40 MG tablet Take 1 tablet (40 mg total) by mouth daily.  . rivaroxaban (XARELTO) 20 MG TABS tablet Take 1 tablet (20 mg total) by mouth daily with supper.  . senna (SENNA-LAX) 8.6 MG tablet Take 1 tablet (8.6 mg total) by mouth 2 (two) times daily.  . sertraline (ZOLOFT) 100 MG tablet Take 1.5 tablets (150 mg total) by mouth daily.    No Known Allergies    Review of Systems negative except from HPI and PMH  Physical Exam BP (!) 98/58   Pulse 60   Ht 5\' 10"  (1.778 m)   Wt 140 lb (63.5 kg)   SpO2 (!) 89%   BMI 20.09 kg/m  Well developed and well nourished in no acute distress HENT normal E scleral and icterus clear Neck Supple JVP flat; carotids brisk and full Clear to ausculation  Regular rate and rhythm, 3/6 murmur which increases with inspiration no gallops or rub Soft with active bowel sounds No clubbing cyanosis  Edema Alert and oriented, grossly normal motor and sensory function Skin Warm and Dry  ECG sinus rhythm with complete heart block.  Ventricular pacing with an upright QRS lead V1 and a negative QRS lead I  CrCl cannot be calculated (Patient's most recent lab result is older than the maximum 21 days allowed.).   Assessment and  Plan  Complete heart block   Tricuspid  valve replacement status post endocarditis resection  Heroin addiction-relapse and recovering  Pacemaker-VVI medtronic abdominal implant  S/P B BKA and R forearm amputation   The patient's device is functioning normally.  It is in the VVI mode.  It has been reprogrammed VVIR increase in the day rate from 60--70 and activated rate  response.  I am not quite sure how the rate sense will behave in the abdomen; it has been implanted near the anterior axillary line.  This may mitigate the rate response activation that would occur with abdominal motion  .  Chart reviewed for > 30 min on today     Current medicines are reviewed at length with the patient today .  The patient does not  have concerns regarding medicines.

## 2020-06-20 NOTE — Patient Instructions (Signed)
Medication Instructions:  Your physician recommends that you continue on your current medications as directed. Please refer to the Current Medication list given to you today.  Labwork: None ordered.  Testing/Procedures: None ordered.  Follow-Up: Your physician wants you to follow-up in: 09/11/2020 at 315pm. You will receive a reminder letter in the mail two months in advance. If you don't receive a letter, please call our office to schedule the follow-up appointment.  Remote monitoring is used to monitor your Pacemaker of ICD from home. This monitoring reduces the number of office visits required to check your device to one time per year. It allows [place] to keep an eye on the functioning of your device to ensure it is working properly.   Any Other Special Instructions Will Be Listed Below (If Applicable).  If you need a refill on your cardiac medications before your next appointment, please call your pharmacy.

## 2020-06-21 ENCOUNTER — Telehealth: Payer: Self-pay | Admitting: Cardiology

## 2020-06-21 ENCOUNTER — Ambulatory Visit (HOSPITAL_COMMUNITY)
Admission: RE | Admit: 2020-06-21 | Discharge: 2020-06-21 | Disposition: A | Discharge status: Home / Self Care | Payer: Medicaid Other | Source: Ambulatory Visit | Attending: Cardiology | Admitting: Cardiology

## 2020-06-21 DIAGNOSIS — Z953 Presence of xenogenic heart valve: Secondary | ICD-10-CM | POA: Insufficient documentation

## 2020-06-21 LAB — COMPREHENSIVE METABOLIC PANEL
ALT: 9 IU/L (ref 0–44)
AST: 19 IU/L (ref 0–40)
Albumin/Globulin Ratio: 1.3 (ref 1.2–2.2)
Albumin: 4.3 g/dL (ref 4.0–5.0)
Alkaline Phosphatase: 134 IU/L — ABNORMAL HIGH (ref 48–121)
BUN/Creatinine Ratio: 17 (ref 9–20)
BUN: 18 mg/dL (ref 6–24)
Bilirubin Total: 0.6 mg/dL (ref 0.0–1.2)
CO2: 24 mmol/L (ref 20–29)
Calcium: 9.5 mg/dL (ref 8.7–10.2)
Chloride: 104 mmol/L (ref 96–106)
Creatinine, Ser: 1.04 mg/dL (ref 0.76–1.27)
GFR calc Af Amer: 102 mL/min/{1.73_m2} (ref >59)
GFR calc non Af Amer: 88 mL/min/{1.73_m2} (ref >59)
Globulin, Total: 3.3 g/dL (ref 1.5–4.5)
Glucose: 70 mg/dL (ref 65–99)
Potassium: 4.9 mmol/L (ref 3.5–5.2)
Sodium: 143 mmol/L (ref 134–144)
Total Protein: 7.6 g/dL (ref 6.0–8.5)

## 2020-06-21 LAB — CBC
Hematocrit: 38.4 % (ref 37.5–51.0)
Hemoglobin: 11.3 g/dL — ABNORMAL LOW (ref 13.0–17.7)
MCH: 21.7 pg — ABNORMAL LOW (ref 26.6–33.0)
MCHC: 29.4 g/dL — ABNORMAL LOW (ref 31.5–35.7)
MCV: 74 fL — ABNORMAL LOW (ref 79–97)
Platelets: 188 10*3/uL (ref 150–450)
RBC: 5.2 x10E6/uL (ref 4.14–5.80)
RDW: 20.1 % — ABNORMAL HIGH (ref 11.6–15.4)
WBC: 4.4 10*3/uL (ref 3.4–10.8)

## 2020-06-21 NOTE — Telephone Encounter (Signed)
DME form from [facility] was left in my inbox for this patient.  Dr. Atha Starks,   Please review form and ensure all request are accurate and then sign. I will co-sign next to your signature.  Form in you inbox.

## 2020-06-21 NOTE — Telephone Encounter (Signed)
New Message   Patient is calling in stating that they missed a call from this number. Please give patient a call back.

## 2020-06-21 NOTE — Progress Notes (Signed)
Echocardiogram 2D Echocardiogram has been performed.  Warren Lacy Zachariah Pavek 06/21/2020, 2:00 PM

## 2020-06-21 NOTE — Telephone Encounter (Signed)
RN spoke to patient 's mother -  Not sure who called . No voicemail was left.  she states she saw  Call on caller ID.  Rn informed mother  No documentation left of an earlier call . If needed the caller will call back . Mother voiced understanding

## 2020-06-22 NOTE — Telephone Encounter (Signed)
Form completed and signed, placed in your box for your signature per your request. Thank you

## 2020-06-23 NOTE — Telephone Encounter (Signed)
I have cosigned it.  However, you forgot to sign the "Requesting Physician" portion of the form. If I fax it without you signing, it might be returned. I left it in your box for final signature, review and faxing. Thanks.

## 2020-06-27 NOTE — Progress Notes (Signed)
Cardiology Office Note   Date:  06/28/2020   ID:  Wesley Fuentes, DOB 09-19-1976, MRN [id number]  PCP:  [name], DO  Cardiologist:   No primary care provider on file. Referring:  [name], DO  Chief Complaint  Patient presents with  . Tricuspid valve stenosis      History of Present Illness: Wesley Fuentes is a 44 y.o. male who is referred by [name], DO for follow up of tricuspid valve replacement and pacemaker placement.     He presented for follow up with a history of SBE.  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     I saw him in 2018 after he moved from  [place].  He has a history of IV drug abuse and he developed meth resistant staph aureus. He had septic emboli or vegetation on the tricuspid valve but no other valvular involvement.  He underwent valve replacement with a 27 mm Mosaic prosthetic tricuspic valve.  Postop he had complete heart block that did not resolve so he underwent [facility]  pacemaker placement.  He had moved to [place], while there unfortunately relapsed back to heroine.  He was hospitalized in August 2020 with endocarditis, complicated with septic emboli to bilateral lower and right upper extremities, lungs and spleen.  He had DIC and AKI.  He had bilateral LE and RUE amputations, he had multiple other complications.  He developed a right IJ DVT.  He had a post op hematoma.  He had PPM site errosion with MRSA.  He underwent pacer extraction and reimplantation at an abdominal site.  Other complications included tricuspid regurgitation (not thought to be a operative candidate) and subsequent ascites requiring paracentesis.    He moved back here to live with his mother.  I saw him again last month.  I sent him for an ultrasound which did not demonstrate ascites.  He had an echo which I reviewed.  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  Since I saw him he is actually done well.  He has had less abdominal bloating.  He does have an umbilical hernia that is not bothering him.  He has had less lower extremity swelling.  He has been fitted with his prosthesis and is now learning to walk on needs.  He is actually getting a prosthesis for his arm.  He has a low blood pressure but is not having any presyncope or syncope.  He is not having any shortness of breath, PND or orthopnea.  He said no chest pressure.   Past Medical History:  Diagnosis Date  . CHB (complete heart block) (HCC)   . Depression 12/11/2017  . Hepatitis C   . History of complete heart block 12/11/2017  . 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 Opioid use disorder, moderate, in early remission (HCC) 12/11/2017    Past Surgical History:  Procedure Laterality Date  . Permanent pacemaker placement     On 11/24/17-had [facility] Jude dual-chamber permanent pacemaker placed.  . Tricuspid valve replacement     On 11/17/17-replacement of tricuspid valve with a 29 mm Medtronic Mosaic mitral prosthesis.     Current Outpatient Medications  Medication Sig Dispense Refill  . Acetaminophen 325 MG CAPS Take 325 mg by mouth every 6 (six) hours as needed.    . Ascorbic Acid (VITAMIN C WITH ROSE HIPS) 500  MG tablet Take 1 tablet (500 mg total) by mouth daily. 30 tablet 1  . bisacodyl (DULCOLAX) 5 MG EC tablet Take 1 tablet (5 mg total) by mouth 2 (two) times daily. 30 tablet 1  . bisacodyl (FLEET) 10 MG/30ML ENEM Place 10 mg rectally as needed.    . famotidine (PEPCID) 20 MG tablet Take 1 tablet (20 mg total) by mouth 2 (two) times daily. 60 tablet 1  . ferrous sulfate 325 (65 FE) MG tablet Take 1 tablet (325 mg total) by mouth daily with breakfast. 30 tablet 0  . methadone (DOLOPHINE) 10 MG tablet Take 10 mg by mouth every 8 (eight) hours as needed.    . minocycline (DYNACIN) 100 MG tablet Take 1 tablet (100 mg total) by  mouth 2 (two) times daily. 180 tablet 3  . pantoprazole (PROTONIX) 40 MG tablet Take 1 tablet (40 mg total) by mouth daily. 30 tablet 1  . rivaroxaban (XARELTO) 20 MG TABS tablet Take 1 tablet (20 mg total) by mouth daily with supper. 30 tablet 1  . senna (SENNA-LAX) 8.6 MG tablet Take 1 tablet (8.6 mg total) by mouth 2 (two) times daily. 60 tablet 1  . sertraline (ZOLOFT) 100 MG tablet Take 1.5 tablets (150 mg total) by mouth daily. 135 tablet 1   No current facility-administered medications for this visit.    Allergies:   Patient has no known allergies.    ROS:  Please see the history of present illness.   Otherwise, review of systems are positive for none.   All other systems are reviewed and negative.    PHYSICAL EXAM: VS:  BP 91/63   Pulse 71   Temp (!) 97.5 F (36.4 C)   Ht 5\' 10"  (1.778 m)   Wt 140 lb (63.5 kg)   SpO2 95%   BMI 20.09 kg/m  , BMI Body mass index is 20.09 kg/m. GEN:  No distress NECK:  Positive jugular venous distention at 90 degrees, waveform within normal limits, carotid upstroke brisk and symmetric, no bruits, no thyromegaly LYMPHATICS:  No cervical adenopathy LUNGS:  Clear to auscultation bilaterally BACK:  No CVA tenderness CHEST:  Well healed surgical scars.   HEART:  S1 and S2 within normal limits, no S3, no S4, no clicks, no rubs, 3 out of 6 diastolic murmur increased with inspiration ABD:  Positive bowel sounds normal in frequency in pitch, no bruits, no rebound, no guarding, unable to assess midline mass or bruit with the patient seated, positive umbilical hernia EXT:  2 plus pulses throughout, no edema, no cyanosis no clubbing status post bilateral lower extremity amputations, right forearm amputation SKIN:  No rashes no nodules NEURO:  Cranial nerves II through XII grossly intact, motor grossly intact throughout PSYCH:  Cognitively intact, oriented to person place and time   EKG:  EKG is not ordered today.    Recent Labs: 06/20/2020: ALT  9; BUN 18; Creatinine, Ser 1.04; Hemoglobin 11.3; Platelets 188; Potassium 4.9; Sodium 143    Lipid Panel No results found for: CHOL, TRIG, HDL, CHOLHDL, VLDL, LDLCALC, LDLDIRECT    Wt Readings from Last 3 Encounters:  06/28/20 140 lb (63.5 kg)  06/20/20 140 lb (63.5 kg)  05/19/20 115 lb (52.2 kg)      Other studies Reviewed: Additional studies/ records that were reviewed today include:   Echo, EP note.   Review of the above records demonstrates:  Please see elsewhere in the note.     ASSESSMENT AND PLAN:  TVR:  He has severe TV stenosis.   However, at present he is really not having symptoms.  He has some low blood pressure but no symptoms related to this.  He does not have any significant abdominal distention or lower extremity edema.  He understands endocarditis prophylaxis.  He is on suppressive antibiotics and has no active evidence of fevers chills or ongoing infection.  I think we can follow this clinically.  I had a long discussion with the patient and his mom about this.  PPM: He saw Dr. Graciela Husbands and had his VVI pacing changed to VVIR.      Current medicines are reviewed at length with the patient today.  The patient does not have concerns regarding medicines.  The following changes have been made:  None  Labs/ tests ordered today include:  None  No orders of the defined types were placed in this encounter.    Disposition:   FU with me in six months.    Signed, Rollene Rotunda, MD  06/28/2020 12:20 PM    [facility]

## 2020-06-28 ENCOUNTER — Other Ambulatory Visit: Payer: Self-pay

## 2020-06-28 ENCOUNTER — Encounter: Payer: Self-pay | Admitting: Cardiology

## 2020-06-28 ENCOUNTER — Ambulatory Visit (INDEPENDENT_AMBULATORY_CARE_PROVIDER_SITE_OTHER): Payer: Medicaid Other | Admitting: Cardiology

## 2020-06-28 VITALS — BP 91/63 | HR 71 | Temp 97.5°F | Ht 70.0 in | Wt 140.0 lb

## 2020-06-28 DIAGNOSIS — I360 Nonrheumatic tricuspid (valve) stenosis: Secondary | ICD-10-CM

## 2020-06-28 NOTE — Patient Instructions (Signed)
Medication Instructions:  Your physician recommends that you continue on your current medications as directed. Please refer to the Current Medication list given to you today.  *If you need a refill on your cardiac medications before your next appointment, please call your pharmacy*   Follow-Up: At [facility], you and your health needs are our priority.  As part of our continuing mission to provide you with exceptional heart care, we have created designated Provider Care Teams.  These Care Teams include your primary Cardiologist (physician) and Advanced Practice Providers (APPs -  Physician Assistants and Nurse Practitioners) who all work together to provide you with the care you need, when you need it.  We recommend signing up for the patient portal called "MyChart".  Sign up information is provided on this After Visit Summary.  MyChart is used to connect with patients for Virtual Visits (Telemedicine).  Patients are able to view lab/test results, encounter notes, upcoming appointments, etc.  Non-urgent messages can be sent to your provider as well.   To learn more about what you can do with MyChart, go to [email or web address].    Your next appointment:   6 month(s)  The format for your next appointment:   In Person  Provider:   James Hochrein, MD      

## 2020-07-04 ENCOUNTER — Telehealth: Payer: Self-pay | Admitting: Family Medicine

## 2020-07-04 NOTE — Telephone Encounter (Signed)
[name] from [facility] is calling looking for LMN form for patient. I checked [place] and [place] box and it wasn't in either.

## 2020-07-05 ENCOUNTER — Telehealth: Payer: Self-pay | Admitting: Family Medicine

## 2020-07-05 NOTE — Telephone Encounter (Signed)
Will forward to Md. Graycie Halley,CMA  

## 2020-07-05 NOTE — Telephone Encounter (Signed)
Mother for Osama Coleson [phone] here today stating he also needed Occupational Therapy along with the referral for physical therapy.  Medication concerns also.  Please call mother at [phone].  Facility ph # is [phone] Fax # [phone]

## 2020-07-06 ENCOUNTER — Telehealth: Payer: Self-pay | Admitting: Family Medicine

## 2020-07-06 NOTE — Telephone Encounter (Signed)
[name] from [facility] DME provider  called regarding paperwork needed;  Her [phone]

## 2020-07-06 NOTE — Telephone Encounter (Signed)
Mother calls nurse line regarding request for referral for OT. Mother also is requesting an increase in antidepressant. Mother reports patient is feeling overwhelmed after the loss of three limbs and long hospitalization. Also reports little interest or pleasure in doing things. Informed mother that patient would likely need a follow up appointment, however, mother wished to wait for instructions from Dr. Atha Starks. Patient is not currently having any thoughts of self harm .   Forwarding to PCP  Veronda Prude, RN

## 2020-07-07 ENCOUNTER — Other Ambulatory Visit: Payer: Self-pay

## 2020-07-07 ENCOUNTER — Ambulatory Visit: Payer: Medicaid Other | Attending: Family Medicine

## 2020-07-07 VITALS — HR 76

## 2020-07-07 DIAGNOSIS — R262 Difficulty in walking, not elsewhere classified: Secondary | ICD-10-CM | POA: Diagnosis present

## 2020-07-07 DIAGNOSIS — R2689 Other abnormalities of gait and mobility: Secondary | ICD-10-CM | POA: Insufficient documentation

## 2020-07-07 DIAGNOSIS — M6281 Muscle weakness (generalized): Secondary | ICD-10-CM | POA: Insufficient documentation

## 2020-07-07 DIAGNOSIS — R293 Abnormal posture: Secondary | ICD-10-CM | POA: Diagnosis not present

## 2020-07-07 NOTE — Therapy (Signed)
[facility] [address] [place], [place], [address] Phone: [phone]   Fax:  [phone]  Physical Therapy Evaluation  Patient Details  Name: Wesley Fuentes MRN: [id number] Date of Birth: [birth year] Referring Provider (PT): Janit Pagan, MD referred by PCP is [name]   Encounter Date: 07/07/2020   PT End of Session - 07/07/20 1237    Visit Number 1    Number of Visits 17   saving 3 visits after 12 weeks with thought OT may use about 8   Authorization Type Medicaid    PT Start Time 1233    PT Stop Time 1326    PT Time Calculation (min) 53 min    Equipment Utilized During Treatment Gait belt   right platform walker   Activity Tolerance Patient tolerated treatment well    Behavior During Therapy WFL for tasks assessed/performed           Past Medical History:  Diagnosis Date  . CHB (complete heart block) (HCC)   . Depression 12/11/2017  . Hepatitis C   . History of complete heart block 12/11/2017  . 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 Opioid use disorder, moderate, in early remission (HCC) 12/11/2017    Past Surgical History:  Procedure Laterality Date  . Permanent pacemaker placement     On 11/24/17-had [facility] Jude dual-chamber permanent pacemaker placed.  . Tricuspid valve replacement     On 11/17/17-replacement of tricuspid valve with a 29 mm Medtronic Mosaic mitral prosthesis.    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 Nov (week before Thanksgiving) 2020 legs & hand ~thanksgiving. He moved from [place] to [place] 04/19/2020.  Pt received his prostheses on 07/04/20. Pt has been wearing the liners most of the day since he got them and has stood a couple times. Wearing full  prosthesis 1 hour a time 3 x/day. Pt was completely independent prior to all this. Currently living down here with parents. Pt going for RUE prosthesis fitting 7/19.    Pertinent History Complete heart block, pacemaker 2018, Tricuspid valve replacements 2018, Bilateral TTAs & hand amputation 2020. Hep C,    Patient Stated Goals Pt wants to be able to get back to his hobbies and work. Wants to be able to walk by September when going back to [place].    Currently in Pain? 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  Yes    Is the patient reluctant to leave their home because of a fear of falling?  No   uses w/c     Home Environment   Living Environment  Private residence    Research officer, trade unionLiving Arrangements Parent;Other relatives   little brother   Available Help at Discharge Family    Type of Home House    Home Access Stairs to enter   temporary ramp   Entrance Stairs-Number of Steps 4    Entrance Stairs-Rails Right;Left;Can reach both    Home Layout One level    Home Equipment Walker - 4 wheels;Shower seat;Wheelchair - manual      Prior Function   Level of Independence Independent;Independent with household mobility without device;Independent with community mobility without device    Vocation On disability    Leisure fishing, time with 15yo son, camping/hiking, outdoors      Cognition   Overall Cognitive Status Within Functional Limits for tasks assessed      Posture/Postural Control   Postural Limitations  Rounded Shoulders;Decreased lumbar lordosis      ROM / Strength   AROM / PROM / Strength Strength      Strength   Overall Strength Comments BUE shoulders and elbows grossly 5/5.     Right Hip Flexion 4/5    Right Hip Extension 3/5    Right Hip ABduction 4/5    Left Hip Flexion 4/5    Left Hip Extension 3/5    Left Hip ABduction 4/5    Right Knee Flexion 4/5    Right Knee Extension 4/5    Left Knee Flexion 4/5    Left Knee Extension 4/5      Transfers   Transfers Sit to Stand;Stand to Sit    Sit to Stand 3: Mod assist    Sit to Stand Details Verbal cues for technique    Sit to Stand Details (indicate cue type and reason) at right platform RW    Stand to Sit 3: Mod assist      Ambulation/Gait   Ambulation/Gait Yes    Ambulation/Gait Assistance 4: Min assist    Ambulation/Gait Assistance Details Pt was cued to stay up in walker and stand tall. Noted decreased left knee extension, residual limb not sitting down all the way in prosthesis with increased edema. HR=72 after gait.    Ambulation Distance (Feet) 20 Feet    Assistive device Right platform walker   w/c follow   Gait Pattern Step-to pattern;Decreased step length - right;Decreased step length - left;Left flexed knee in stance    Ambulation Surface Level;Indoor      Balance   Balance Assessed Yes      Standardized Balance Assessment   Standardized Balance Assessment Berg Balance Test      Berg Balance Test   Sit to Stand Needs moderate or maximal assist to stand    Standing Unsupported Unable to stand 30 seconds unassisted    Sitting with Back Unsupported but Feet Supported on Floor or Stool Able to sit safely and securely 2 minutes    Stand to Sit Needs assistance to sit    Transfers Needs two people to assist of supervise to be safe    Standing Unsupported with Eyes Closed Needs help to keep from falling    Standing Unsupported with Feet Together Needs help to attain position and unable to hold for 15 seconds     From Standing, Reach Forward with Outstretched Arm Loses balance while trying/requires external support    From Standing Position, Pick up Object from Floor Unable to try/needs assist to keep balance    From Standing Position, Turn to Look Behind Over each Shoulder Needs assist to keep from  losing balance and falling    Turn 360 Degrees Needs assistance while turning    Standing Unsupported, Alternately Place Feet on Step/Stool Needs assistance to keep from falling or unable to try    Standing Unsupported, One Foot in [employer] balance while stepping or standing    Standing on One Leg Unable to try or needs assist to prevent fall    Total Score 4      High Level Balance   High Level Balance Comments Pt stood at platform RW CGA. Pt able to remove UE support only for a second and leaning posterior. Pt able to look over left and right shoulders but needed CGA/min assist.            Prosthetics Assessment - 07/07/20 0001      Prosthetics   Prosthetic Care Dependent with Skin check;Residual limb care;Prosthetic cleaning;Correct ply sock adjustment;Proper wear schedule/adjustment    Prosthetic Care Comments  Pt was instructed to wash liners daily on evening with soap and water and allow to dry overnight. Discussed not shaving leg due to infection risk. Advised to trim with scissors if needed. Discussed if needs lotion only using at night and to be sure to wipe off in morning. Discussed use of antiperspirant if needed on residual limb if sweating should be come and issue.    Donning prosthesis  Max assist   able to donn RLE prosthesis but max assist on left    Doffing prosthesis  Supervision    Current prosthetic wear tolerance (days/week)  just received 3 days ago, has worn each day since    Current prosthetic wear tolerance (#hours/day)  1 hour, 3x/day    Current prosthetic weight-bearing tolerance (hours/day)  stood and walked about 10 minutes today. Pt reported some pain in residual limbs, No  redness noted after    Edema no pitting edema noted but left residual limb more swollen than right with difficulty fitting in to prosthesis. Pt forgot to wear shrinker last night.    Residual limb condition  bilateral TTA limbs healed completely, dry skin distally, normal color & temperature, cylinderical shape except left has medial skin flare and right has lateral skin flare    Prosthesis Description silicone liners with pin lock    K code/activity level with prosthetic use  K3                     Objective measurements completed on examination: See above findings.               PT Education - 07/07/20 1955    Education Details Pt was educated on prosthetic care, wear time. Discussed starting with 2 hours prosthetic wear, 2x/day and checking skin after. Can increase every 4-5 days 1 hour if skin doing well. Discussed PT plan of care.    Person(s) Educated Patient;Parent(s)    Methods Explanation    Comprehension Verbalized understanding            PT Short Term Goals - 07/07/20 2020      PT SHORT TERM GOAL #1   Title Pt will be independent with donning bilateral prostheses for improved independence.    Baseline currently max assist to donn right prosthesis    Time 4    Period Weeks    Status New    Target Date 08/06/20      PT SHORT TERM GOAL #2   Title Pt will be able to tolerate wearing prosthesis for 8 hours/day.  Baseline 1 hours, 3x/day    Time 4    Period Weeks    Status New    Target Date 08/06/20      PT SHORT TERM GOAL #3   Title Pt will be able to maintain balance in standing x 1 min without UE support for improved balance and standing ADLs    Baseline unable to maintain standing balance without UE support and CGA/min assist    Time 4    Period Weeks    Status New    Target Date 08/06/20      PT SHORT TERM GOAL #4   Title Pt will ambulate 100' with bilateral prosthesis and platform RW CGA for improved mobility in home.    Baseline  20' min assist with platform RW    Time 4    Period Weeks    Status New    Target Date 08/06/20      PT SHORT TERM GOAL #5   Title Pt will be mod I with sit to stand transfers for improved mobility and functional strength.    Baseline mod assist with sit to stand transfer    Time 4    Period Weeks    Status New    Target Date 08/06/20             PT Long Term Goals - 07/07/20 2026      PT LONG TERM GOAL #1   Title Pt will be independent with prosthetic care for improved function.    Baseline dependent    Time 12    Period Weeks    Status New    Target Date 10/05/20      PT LONG TERM GOAL #2   Title Pt will report wearing prostheses all awake hours for improved function.    Baseline curently 1 hour 3x/day    Time 12    Period Weeks    Status New    Target Date 10/05/20      PT LONG TERM GOAL #3   Title Pt will be able to perform Berg Balance increasing score from 4/56 to >40/56 for improved balance and decreased fall risk.    Baseline 4/56    Time 12    Period Weeks    Status New    Target Date 10/05/20      PT LONG TERM GOAL #4   Title Pt will ambulate >500' with LRAD mod I for improved short community distances.    Baseline 20' min assist with right platform RW    Time 12    Period Weeks    Status New    Target Date 10/05/20      PT LONG TERM GOAL #5   Title Pt will be able to negotiate up/down curb/ramp with LRAD and stairs with railing mod I for improved community access.    Baseline unable to perform currently    Time 12    Period Weeks    Status New    Target Date 10/05/20                  Plan - 07/07/20 2000    Clinical Impression Statement Pt is 44 y/o male who presents for prosthetic evaluation after receiving his legs 07/04/20. Pt has bilateral transtibial amputations as well as right transradial amputation. Pt has silicone liners with shuttle pin locks with flex keel feet. Pt's residual limbs have healed well. He does extensive cardiac  issues with pacemaker as well. Pt  has decreased strength in bilateral LE mostly in hip extensors at 3/5. His balance is impaired and was unable to stand without UE support and CGA/min assist at platform walker. Pt was mod assist to stand. Pt is high fall risk. Min assist with gait 20'. Pt will benefit from skilled PT to focus on prosthetic gait training and improve balance and strength to maximize function. OT referral will be needed to address UE amputation once prosthesis is obtained.    Personal Factors and Comorbidities Comorbidity 3+    Comorbidities Complete heart block, pacemaker 2018, Tricuspid valve replacements 2018, Bilateral TTAs & hand amputation 2020. Hep C,    Examination-Activity Limitations Locomotion Level;Stand;Transfers;Stairs    Examination-Participation Restrictions Community Activity;Yard Work    Stability/Clinical Decision Making Evolving/Moderate complexity    Clinical Decision Making Moderate    Rehab Potential Good    PT Frequency 1x / week   followed by 2x/week for 4 weeks then 1x/week for 4 weeks   PT Duration 4 weeks    PT Treatment/Interventions ADLs/Self Care Home Management;Gait training;Stair training;Functional mobility training;Therapeutic activities;DME Instruction;Neuromuscular re-education;Balance training;Therapeutic exercise;Patient/family education;Prosthetic Training;Manual techniques;Passive range of motion;Energy conservation    PT Next Visit Plan How is wear time going? Do we have OT order for when after gets UE prosthesis. Give handouts on prosthetic care. Transfer training with sit to stand at platform walker, weight shifting then gait training. Monitor vitals closely due to extensive cardiac history.    Consulted and Agree with Plan of Care Patient;Family member/caregiver    Family Member Consulted mother, Abigail Butts           Patient will benefit from skilled therapeutic intervention in order to improve the following deficits and  impairments:  Abnormal gait, Cardiopulmonary status limiting activity, Decreased activity tolerance, Decreased balance, Decreased endurance, Decreased mobility, Decreased range of motion, Difficulty walking, Decreased strength, Decreased knowledge of use of DME, Pain, Prosthetic Dependency  Visit Diagnosis: Abnormal posture  Difficulty in walking, not elsewhere classified  Muscle weakness (generalized)  Other abnormalities of gait and mobility     Problem List Patient Active Problem List   Diagnosis Date Noted  . 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 Educated about COVID-19 virus infection 05/11/2020  . S/P bilateral below knee amputation (HCC) 05/09/2020  . 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 History of bacterial endocarditis 05/02/2020  . 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 Complete heart block (HCC) 12/11/2017  . 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 Opioid use disorder, moderate, in early remission (HCC) 12/11/2017  . Depression 12/11/2017    Ronn Melena, PT, DPT, NCS 07/07/2020, 8:32 PM  [facility] [address] [place], [place], [address] Phone: [phone]   Fax:  [phone]  Name: Wesley Fuentes MRN: [id number] Date of Birth: October 11, 1976

## 2020-07-07 NOTE — Telephone Encounter (Signed)
Returned patient call. Patient plans to make appointment for next week to further discuss options.

## 2020-07-07 NOTE — Telephone Encounter (Signed)
Returned phone call, will complete paperwork requested and fax back to [facility]

## 2020-07-07 NOTE — Telephone Encounter (Signed)
So sorry!  I had already called Shameeka.  The form is in your box.  Jone Baseman, CMA

## 2020-07-13 ENCOUNTER — Telehealth: Payer: Self-pay | Admitting: Cardiology

## 2020-07-13 NOTE — Telephone Encounter (Signed)
1. What dental office are you calling from? [facility]    2. What is your office phone number? [phone]   3. What is your fax number? [phone]  4. What type of procedure is the patient having performed? Oral Surgery   5. What date is procedure scheduled or is the patient there now? 08/16/20  (if the patient is at the dentist's office question goes to their cardiologist if he/she is in the office.  If not, question should go to the DOD).   6. What is your question (ex. Antibiotics prior to procedure, holding medication-we need to know how long dentist wants pt to hold med)? Tiffany with [facility] is inquiring about whether or not the patient is eligible for sedation. In addition, she would like to know if any medications need to be held. Please advise.

## 2020-07-13 NOTE — Telephone Encounter (Signed)
   [facility] Pre-operative Risk Assessment    HEARTCARE STAFF: - Please ensure there is not already an duplicate clearance open for this procedure. - Under Visit Info/Reason for Call, type in Other and utilize the format Clearance MM/DD/YY or Clearance TBD. Do not use dashes or single digits. - If request is for dental extraction, please clarify the # of teeth to be extracted.  Request for surgical clearance:-UPDATED  1. What type of surgery is being performed?  EVALUATE INFECTION/EXTRACTION OF 1 TOOTH  2. When is this surgery scheduled? 08/16/20   3. What type of clearance is required (medical clearance vs. Pharmacy clearance to hold med vs. Both)? BOTH  4. Are there any medications that need to be held prior to surgery and how long?PT TAKES XARELTO   5. Practice name and name of physician performing surgery? [facility]  ATTN: TIFFANY  6. What is the office phone number? [phone]   7.   What is the office fax number? [phone]  8.   Anesthesia type (None, local, MAC, general) ? SEDATION-TIFFANY WILL FIND OUT AND CALL BACK.  PLEASE CALL TIFFANY WITH ANY QUESTIONS.   Waylan Rocher 07/13/2020, 4:20 PM

## 2020-07-14 NOTE — Telephone Encounter (Signed)
   Primary Cardiologist: Rollene Rotunda, MD  Chart reviewed as part of pre-operative protocol coverage. Prior history of IVDA, TV endocarditis with recurrence, heart block s/p pacemaker, prior septic emboli, DIC, AKI, bilateral UE/LE amputations, DVT, PPM site with erosion, residual severe TV stenosis.  For simple extraction, anticipate continuing anticoagulation without interruption. However, patient already appears to be on suppressive antibiotics through Dr. Antoine Poche (minocycline). Will route to Dr. Antoine Poche to find out if additional endocarditis ppx is needed at time of procedure. Dr. Antoine Poche - Please route response to P CV DIV PREOP (the pre-op pool). Thank you.   Laurann Montana, PA-C 07/14/2020, 3:22 PM

## 2020-07-14 NOTE — Telephone Encounter (Signed)
I s/w Tiffany with dental office in regards to anesthesia being used. Per Elmarie Shiley, she is waiting to s/w the anesthesiologist who is currently in surgery at this moment. Tiffany states call came into her yesterday at 4:30 7/15 and she leaves at 4:30. I asked if she would please call back ASAP so the cardiologist will have time to assess for pre op clearance, as cardiology does not want to delay the pt's surgery. Tiffany said she will call back ASAP once she s/w the anesthesiologist.

## 2020-07-14 NOTE — Telephone Encounter (Signed)
   Primary Cardiologist: Rollene Rotunda, MD  Chart reviewed as part of pre-operative protocol coverage. Callback, please find out update regarding sedation as noted below - awaiting information from someone named Tiffany.     Laurann Montana, PA-C 07/14/2020, 9:21 AM

## 2020-07-14 NOTE — Telephone Encounter (Signed)
New message   Per Dr. Loraine Grip the anesthesia will be MAC.

## 2020-07-14 NOTE — Telephone Encounter (Signed)
No

## 2020-07-17 ENCOUNTER — Ambulatory Visit: Payer: Medicaid Other

## 2020-07-17 ENCOUNTER — Other Ambulatory Visit: Payer: Self-pay

## 2020-07-17 VITALS — BP 122/72 | HR 72

## 2020-07-17 DIAGNOSIS — R293 Abnormal posture: Secondary | ICD-10-CM

## 2020-07-17 DIAGNOSIS — M6281 Muscle weakness (generalized): Secondary | ICD-10-CM

## 2020-07-17 DIAGNOSIS — R2689 Other abnormalities of gait and mobility: Secondary | ICD-10-CM

## 2020-07-17 NOTE — Telephone Encounter (Signed)
   Primary Cardiologist: Rollene Rotunda, MD  Chart reviewed as part of pre-operative protocol coverage. Given past medical history and time since last visit, based on ACC/AHA guidelines, Wesley Fuentes would be at acceptable risk for the planned procedure without further cardiovascular testing.   No need to hold anticoagulation for removal of one tooth.  Per Dr Antoine Poche the patient does not need additional antibiotics as they reportedly are already on minocycline.  I will route this recommendation to the requesting party via Epic fax function and remove from pre-op pool.  Please call with questions.  Corine Shelter, PA-C 07/17/2020, 8:43 AM

## 2020-07-17 NOTE — Patient Instructions (Signed)
Sweating increases with an amputation. Your body is trying to regulate your temperature & without an extremity, you sweat more easily to cool off. Also prosthetic material like liners do not breath and add hot layers which causes even more sweating. With time your body typically will accommodate to prosthesis and your sweat level will come closer to level with amputation but not pre-amputation level.   You need to pat your limb & liner dry when you notice sweating. If you leave sweat trapped inside your liner, then it can result in a blister.   Signs of sweating in your liner: 1. You are sweating elsewhere on your body or you notice sweat running / dripping.  2. Take note of how high your liner comes up on your limb when you first put your liner on your limb. If you notice that your liner has slipped down, then you probably have sweat inside your liner. A good time to check for liner slippage is when toileting.  3. You feel air bubbles inside your liner. When you liner slips, then air is allowed in bottom. As you put weight on prosthesis, the air is burp or pushed out. 4. You feel something crawling or moving inside your liner. When sweat runs inside the closed system of liner, it often feels like a bug or something crawling inside your liner.  If any of above symptoms are noted, you need to remove your prosthesis & liner to pat your limb & liner dry. This is permanent need as leaving sweat or water trapped can result in a blister or wound.    [employer] Socks:  1-ply is thin no color at top,  3-ply is yellow at top,  5-ply is green at top [employer] Socks: 1-ply is yellow color at top, 3-ply is green at top, 5-ply is navy blue at top How many ply you need depends on your limb size.  You should have even pressure on your limb when standing & walking.  Guidance points: 1. How ease it goes on? Should be some resistance. Too few it goes on too easily. Too many it takes a lot of work to get it on. 2. How many  clicks you get. Especially clicks in sitting. 3. After standing or walking, check knee cap. Bottom should be just under the front lip.  Too few bottom of knee cap sits on indention. Too many bottom is above front lip. 4. Have your feet beside each other & hips over feet. Place hands on your waist. Pelvis Should be level. Too few prosthetic side will be low. Too many prosthetic side will be high.    Get ply socks correct before you leave the house. Take extra socks with you. Take one 3-ply and two 1-ply with you. This is in addition to what you are wearing.   

## 2020-07-17 NOTE — Therapy (Signed)
[facility] [address] [place], [place], [address] Phone: [phone]   Fax:  [phone]  Physical Therapy Treatment  Patient Details  Name: Wesley Fuentes MRN: [id number] Date of Birth: January 29, 1976 Referring Provider (PT): Janit Pagan, MD referred by PCP is [name]   Encounter Date: 07/17/2020   PT End of Session - 07/17/20 1540    Visit Number 2    Number of Visits 17   saving 3 visits after 12 weeks with thought OT may use about 8   Authorization Type Medicaid, pending initial request of 3 visits    PT Start Time 1535    PT Stop Time 1619    PT Time Calculation (min) 44 min    Equipment Utilized During Treatment Gait belt   right platform walker   Activity Tolerance Patient tolerated treatment well    Behavior During Therapy WFL for tasks assessed/performed           Past Medical History:  Diagnosis Date  . CHB (complete heart block) (HCC)   . Depression 12/11/2017  . Hepatitis C   . History of complete heart block 12/11/2017  . 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 Opioid use disorder, moderate, in early remission (HCC) 12/11/2017    Past Surgical History:  Procedure Laterality Date  . Permanent pacemaker placement     On 11/24/17-had [facility] Jude dual-chamber permanent pacemaker placed.  . Tricuspid valve replacement     On 11/17/17-replacement of tricuspid valve with a 29 mm Med[employer]onic Mosaic mitral prosthesis.    Vitals:   07/17/20 1552  BP: 122/72  Pulse: 72  SpO2: 92%     Subjective Assessment - 07/17/20 1539    Subjective Pt reports he has been wearing his legs about 2 hours in morning and 1 hour in evening. Shrinker on at all other times. Denies any issues with skin. Has been working on standing at the counter. Had casting for UE prosthesis this morning. They did get walker with bilateral platforms.    Pertinent History Complete heart block,  pacemaker 2018, Tricuspid valve replacements 2018, Bilateral TTAs & hand amputation 2020. Hep C,    Patient Stated Goals Pt wants to be able to get back to his hobbies and work. Wants to be able to walk by September when going back to [place].    Currently in Pain? No/denies                             [facility] Adult PT Treatment/Exercise - 07/17/20 1552      Transfers   Transfers Sit to Stand;Stand to Sit    Sit to Stand 4: Min assist    Sit to Stand Details Verbal cues for technique    Sit to Stand Details (indicate cue type and reason) at right platform walker and at counter    Stand to Sit 4: Min assist    Stand to Sit Details (indicate cue type and reason) Verbal cues for technique    Comments --      Ambulation/Gait   Ambulation/Gait Yes    Ambulation/Gait Assistance 4: Min guard;4: Min assist    Ambulation/Gait Assistance Details Pt was cued to walk heel to toe but not take too large of steps. Also cued to keep chest up. HR=74 and O2 sat=94% after gait.    Ambulation Distance (Feet) 40 Feet   40' x1   Assistive  device Right platform walker   w/c follow   Gait Pattern Step-through pattern;Decreased step length - right;Decreased step length - left    Ambulation Surface Level;Indoor      Neuro Re-ed    Neuro Re-ed Details  Pt stood at counter weight shifting side to side x 10 with light UE support CGA. Standing without UE support x 20 sec with initial LOB posterior then 36 sec on 2nd trial after seated rest break.      Prosthetics   Current prosthetic wear tolerance (days/week)  daily    Current prosthetic wear tolerance (#hours/day)  2 hours in morning 1 hour in pm    Edema minimal but left more than right    Residual limb condition  intact    Education Provided Skin check;Residual limb care;Correct ply sock adjustment;Proper wear schedule/adjustment;Proper Donning;Proper Doffing    Person(s) Educated Patient;Parent(s)    Education Method  Explanation;Demonstration;Handout;Verbal cues    Donning Prosthesis Supervision    Doffing Prosthesis Supervision                  PT Education - 07/17/20 1758    Education Details Pt was instructed to increase wear time to 3 hours 2x/day. Discussed starting to work on walking short distances 20-30' with parent assist 2x/day. Should use platform only on right and educated on walker height adjustment.  Issued handout of sock adjustment and sweating.    Person(s) Educated Patient;Parent(s)    Methods Explanation;Handout    Comprehension Verbalized understanding            PT Short Term Goals - 07/07/20 2020      PT SHORT TERM GOAL #1   Title Pt will be independent with donning bilateral prostheses for improved independence.    Baseline currently max assist to donn right prosthesis    Time 4    Period Weeks    Status New    Target Date 08/06/20      PT SHORT TERM GOAL #2   Title Pt will be able to tolerate wearing prosthesis for 8 hours/day.    Baseline 1 hours, 3x/day    Time 4    Period Weeks    Status New    Target Date 08/06/20      PT SHORT TERM GOAL #3   Title Pt will be able to maintain balance in standing x 1 min without UE support for improved balance and standing ADLs    Baseline unable to maintain standing balance without UE support and CGA/min assist    Time 4    Period Weeks    Status New    Target Date 08/06/20      PT SHORT TERM GOAL #4   Title Pt will ambulate 100' with bilateral prosthesis and platform RW CGA for improved mobility in home.    Baseline 20' min assist with platform RW    Time 4    Period Weeks    Status New    Target Date 08/06/20      PT SHORT TERM GOAL #5   Title Pt will be mod I with sit to stand transfers for improved mobility and functional strength.    Baseline mod assist with sit to stand transfer    Time 4    Period Weeks    Status New    Target Date 08/06/20             PT Long Term Goals - 07/07/20 2026  PT LONG TERM GOAL #1   Title Pt will be independent with prosthetic care for improved function.    Baseline dependent    Time 12    Period Weeks    Status New    Target Date 10/05/20      PT LONG TERM GOAL #2   Title Pt will report wearing prostheses all awake hours for improved function.    Baseline curently 1 hour 3x/day    Time 12    Period Weeks    Status New    Target Date 10/05/20      PT LONG TERM GOAL #3   Title Pt will be able to perform Berg Balance increasing score from 4/56 to >40/56 for improved balance and decreased fall risk.    Baseline 4/56    Time 12    Period Weeks    Status New    Target Date 10/05/20      PT LONG TERM GOAL #4   Title Pt will ambulate >500' with LRAD mod I for improved short community distances.    Baseline 20' min assist with right platform RW    Time 12    Period Weeks    Status New    Target Date 10/05/20      PT LONG TERM GOAL #5   Title Pt will be able to negotiate up/down curb/ramp with LRAD and stairs with railing mod I for improved community access.    Baseline unable to perform currently    Time 12    Period Weeks    Status New    Target Date 10/05/20                 Plan - 07/17/20 1801    Clinical Impression Statement Pt was able to demonstrate improved ability to donn prosthesis today with less assist. Was able to increase gait distance with more upright posture. Also required decreased assistance with transfers today.    Personal Factors and Comorbidities Comorbidity 3+    Comorbidities Complete heart block, pacemaker 2018, Tricuspid valve replacements 2018, Bilateral TTAs & hand amputation 2020. Hep C,    Examination-Activity Limitations Locomotion Level;Stand;Transfers;Stairs    Examination-Participation Restrictions Community Activity;Yard Work    Stability/Clinical Decision Making Evolving/Moderate complexity    Rehab Potential Good    PT Frequency 1x / week   followed by 2x/week for 4 weeks then 1x/week  for 4 weeks   PT Duration 4 weeks    PT Treatment/Interventions ADLs/Self Care Home Management;Gait training;Stair training;Functional mobility training;Therapeutic activities;DME Instruction;Neuromuscular re-education;Balance training;Therapeutic exercise;Patient/family education;Prosthetic Training;Manual techniques;Passive range of motion;Energy conservation    PT Next Visit Plan How is wear time going? Do we have OT order for when after gets UE prosthesis. Transfer training with sit to stand at platform walker, weight shifting then gait training. Monitor vitals closely due to extensive cardiac history.    Consulted and Agree with Plan of Care Patient;Family member/caregiver    Family Member Consulted mother, Abigail Butts           Patient will benefit from skilled therapeutic intervention in order to improve the following deficits and impairments:  Abnormal gait, Cardiopulmonary status limiting activity, Decreased activity tolerance, Decreased balance, Decreased endurance, Decreased mobility, Decreased range of motion, Difficulty walking, Decreased strength, Decreased knowledge of use of DME, Pain, Prosthetic Dependency  Visit Diagnosis: Other abnormalities of gait and mobility  Muscle weakness (generalized)  Abnormal posture     Problem List Patient Active Problem List   Diagnosis  Date Noted  . 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 Educated about COVID-19 virus infection 05/11/2020  . S/P bilateral below knee amputation (HCC) 05/09/2020  . 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 History of bacterial endocarditis 05/02/2020  . 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 Complete heart block (HCC) 12/11/2017  . 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 Opioid use disorder, moderate, in early remission (HCC) 12/11/2017  . Depression 12/11/2017    Ronn MelenaEmily A Nolah Krenzer, PT, DPT, NCS 07/17/2020, 6:04 PM  [facility] [address] [place], [place], [address] Phone: [phone]   Fax:  [phone]  Name: Lilia Proicholas S Desha MRN: [id number] Date of Birth: [birth year]

## 2020-07-21 ENCOUNTER — Telehealth: Payer: Self-pay | Admitting: *Deleted

## 2020-07-21 NOTE — Telephone Encounter (Signed)
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    Procedure: Multiple teeth extractions ( 10 teeth) Date of procedure: TBD  Per hospital d/c paperwork, patients Xarelto therapy would end 07/18/20.  Will need to clarify if patient should even still be on Xarelto. Will send message to Dr. Antoine Poche and to PCP Dr. Atha Starks.

## 2020-07-21 NOTE — Telephone Encounter (Signed)
° °  [facility] Pre-operative Risk Assessment    PRIMARY CARDIOLOGIST: DR [facility]  Request for surgical clearance:  1. What type of surgery is being performed?  Multiple teeth extractions ( 10 teeth)  2. When is this surgery scheduled? tbd  3. What type of clearance is required (medical clearance vs. Pharmacy clearance to hold med vs. Both)? both  4. Are there any medications that need to be held prior to surgery and how long?Xarelto  5. Practice name and name of physician performing surgery? Agency; Dr Annamaria Helling   6. What is the office phone number?  [phone]   7.   What is the office fax number? [phone]  8.   Anesthesia type (None, local, MAC, general) ?  IV SEDATION   Wesley Fuentes 07/21/2020, 12:30 PM  _________________________________________________________________   (provider comments below)

## 2020-07-24 NOTE — Telephone Encounter (Signed)
Please inform the patient to hold Xarelto for 1 day prior to dental procedure.

## 2020-07-24 NOTE — Telephone Encounter (Signed)
Patient may hold Xarelto for 1 day prior to dental procedure. He should also follow up with his PCP or Dr. Antoine Poche on length of therapy with Xarelto.

## 2020-07-25 NOTE — Telephone Encounter (Signed)
I called and spoke with patients mother, she is aware that patient can hold Xarelto one day prior to surgery, Dr. Hall Busing office is aware that patient is to hold Xarelto one day prior to procedure as well.

## 2020-07-27 ENCOUNTER — Ambulatory Visit: Payer: Medicaid Other | Admitting: Physical Therapy

## 2020-07-27 ENCOUNTER — Telehealth: Payer: Self-pay | Admitting: *Deleted

## 2020-07-27 NOTE — Telephone Encounter (Signed)
Received called from [facility].  They need documentation of the need for prosthetics.  This can either be in an OVN or as a letter of medical necessity.  The forms are in your box. Jone Baseman, CMA

## 2020-07-31 ENCOUNTER — Ambulatory Visit: Payer: Medicaid Other | Admitting: Family Medicine

## 2020-07-31 ENCOUNTER — Other Ambulatory Visit: Payer: Self-pay

## 2020-07-31 ENCOUNTER — Encounter: Payer: Self-pay | Admitting: Family Medicine

## 2020-07-31 VITALS — HR 70 | Ht 70.0 in

## 2020-07-31 DIAGNOSIS — R5383 Other fatigue: Secondary | ICD-10-CM | POA: Insufficient documentation

## 2020-07-31 DIAGNOSIS — F329 Major depressive disorder, single episode, unspecified: Secondary | ICD-10-CM | POA: Diagnosis not present

## 2020-07-31 DIAGNOSIS — R768 Other specified abnormal immunological findings in serum: Secondary | ICD-10-CM

## 2020-07-31 DIAGNOSIS — F32A Depression, unspecified: Secondary | ICD-10-CM

## 2020-07-31 MED ORDER — SERTRALINE HCL 100 MG PO TABS: 200.0000 mg | ORAL_TABLET | Freq: Every day | ORAL | 1 refills | Status: DC

## 2020-07-31 NOTE — Progress Notes (Signed)
SUBJECTIVE:   CHIEF COMPLAINT / HPI:   Bilateral lower limb amputee, unilateral upper limb amputee Patient with a history of bilateral lower limb amputation as well as unilateral upper limb amputation due to endocarditis in the past.  Currently working with [facility] in order to get prosthetics for upper and lower limb to improve patient's functionality as he currently is unable to do his ADLs.  Patient states he has received his lower prosthetics from [facility] but has not yet received his upper prosthetic from them.  HCV antibody positive history Documented in chart from 3 years ago.  Patient states he does not believe he ever received treatment for this.  He states at one time he was told that he did not have hepatitis C but at another time was told that he did.  Medication adjustment Patient states that even though the discharge summary from his previous hospitalizations that he could stop his Xarelto in July of this year he feels that another physician told him he should continue it for life.  Patient states he plans to follow-up with his cardiologist to determine if he can stop it or not.  Patient also states he takes some antibiotic that is not on his medication list but doesn't remember the name.  He states that he thinks he supposed to be on this for life.  He plans to find out the name of this medication and follow-up.  Depression: Patient with a history of depression, no SI/HI at this time.  Has been on Zoloft at 150 mg/day.  Request increase in this as he feels he may get more benefit at a higher dose.  Fatigue: Patient states that he has a friend that used opioids similar to himself and gets testosterone checked and found this to be low.  He started on testosterone placement therapy and felt much better.  Patient states he does not wish to have further work-up for fatigue at this point and mostly wishes to check his testosterone levels.  Discussed with patient that  testosterone replacement therapy does come with increased risk such as hyperlipidemia, hypertension, increased instances of cancer, and possibility of increased risk of blood clots.  Patient states he would like to have his levels checked nonetheless.  PERTINENT  PMH / PSH: History of bilateral lower limb amputation and unilateral upper limb amputation.  OBJECTIVE:   Pulse 70   Ht 5\' 10"  (1.778 m)   SpO2 90%   BMI 20.09 kg/m      Office Visit from 07/31/2020 in [facility]  PHQ-9 Total Score 14     General: NAD, pleasant, able to participate in exam Cardiac: RRR, no murmurs. Respiratory: CTAB MSK: Patient in wheelchair with bilateral lower limb amputation, right arm amputation. Psych: Normal affect and mood  ASSESSMENT/PLAN:   Depression Assessment: No SI/HI today, PHQ-9 score of 14.  Currently on Zoloft 150 mg/day. Plan: -Will increase patient's Zoloft to 200 mg/day -Introduced patient to Dr. Borgarnes who plans to schedule appointments for counseling for the patient -No SI/HI today, provided suicide resources just in case -Recommend follow-up in 4 weeks  Fatigue Assessment: Patient with a history of fatigue for several years now.  States he had a friend who abused opioids as well and has testosterone checked and found that this was the cause of his fatigue.  States his friend feels much better after starting testosterone replacement therapy and so he request to have this checked today.  Patient is not  interested in checking other fatigue labs at this time.  Overall assessment patient's fatigue is broad and can include poor sleep, lack of exercise, stress, depression, low testosterone, vitamin/mineral deficiencies. Plan: -We will check patient's testosterone today as requested -Did discuss with patient prior to ordering the lab that should we check the testosterone level and it be low, testosterone replacement dose, at increased risk such as hyperlipidemia,  hypertension, increased incidences of cancer, possibility of increased blood clots.  Patient states he understands these risk but believes his life expectancy may be less than the average due to his past medical history and feels he would be interested to know if his testosterone was low and would be interested in replacement if so.  HCV antibody positive Assessment: Patient with a history of hepatitis C antibody positive.  Has never had treatment per his knowledge. Plan: -We will check hepatitis C viral load with reflex to genotype. -Once results, will refer to infectious disease   Medication adjustment Assessment: Patient currently on Xarelto due to history of blood clots.  Per the last discharge summary it looks like the patient can discontinue this as of the end of last month, however patient believes one of his physicians told him he should be on this for life.  Patient also states that he believes he is on an antibiotic does not list medication list, though he cannot remember the name of this.  He states he believes he is supposed to be on this antibiotic for life.  Plan: -Patient plans to follow-up with his previous cardiologist to determine if he indeed is able to stop this or if he should continue this ongoing.  Patient states he has enough of this Xarelto for the next few weeks. -Patient plans to follow-up with his mother to determine the name of his antibiotic and how long he is supposed to be on this.  He plans to call and leave a message regarding this information so this can be added to his chart.  History of bilateral lower limb amputation, unilateral upper limb amputation. Assessment: Patient with a history of the above.  Currently follows with [facility].  States he has received his lower limb prosthetics but not yet received his right arm prosthetic.  Patient with difficulty doing activities of daily living without having prosthetics. Plan: -We will follow up with [facility]  to ensure patient's upper limb prosthetic is delivered to him.   [name], DO [facility]

## 2020-07-31 NOTE — Patient Instructions (Addendum)
It was great to see you!  Our plans for today:  - If you can please follow up with your previous cardiologist or who started you on xarelto to determine how long you should take it for. The previous discharge summary said to continue it until end of July 2021. -I am increasing your Zoloft to 200 mg/day -Dr. Jolyne Loa, our clinical psychologist will be following up with you to schedule an appointment with herself -I am checking your hepatitis C levels as well as your testosterone today for fatigue -If these levels come back low I would like for you to schedule a follow-up appointment so we can further discuss the pros and cons of replacement therapy. -Please try to find out what antibiotic you are on and for the amount of time you should be on the antibiotic  We are checking some labs today, I will call you if they are abnormal will send you a MyChart message or a letter if they are normal.  If you do not hear about your labs in the next 2 weeks please let us know.  Take care and seek immediate care sooner if you develop any concerns.   Dr. [facility]   If you are feeling suicidal or depression symptoms worsen please immediately go to:   24 Hour Availability [facility]  [address] [place], [place] [phone] Crisis [phone]    . If you are thinking about harming yourself or having thoughts of suicide, or if you know someone who is, seek help right away. . Call your doctor or mental health care provider. . Call 911 or go to a hospital emergency room to get immediate help, or ask a friend or family member to help you do these things. . Call the [place] National Suicide Prevention Lifeline's toll-free, 24-hour hotline at 1-800-273-TALK (831)337-2900) or TTY: 1-800-799-4 TTY 215-498-3861) to talk to a trained counselor. . If you are in crisis, make sure you are not left alone.  . If someone else is in crisis, make sure he or she  is not left alone   [facility] of the [employer] (Domestic Violence, Rape & Victim Assistance [phone]  [facility] [employer] Crisis [facility]    (ONLY from 8am-4pm)    [phone]  [facility] Mobile Crisis Unit (24/7)   [phone]  [place] National Suicide Hotline   (972)408-4793 Len Childs)

## 2020-07-31 NOTE — Assessment & Plan Note (Signed)
Assessment: Patient with a history of hepatitis C antibody positive.  Has never had treatment per his knowledge. Plan: -We will check hepatitis C viral load with reflex to genotype. -Once results, will refer to infectious disease

## 2020-07-31 NOTE — Assessment & Plan Note (Signed)
Assessment: No SI/HI today, PHQ-9 score of 14.  Currently on Zoloft 150 mg/day. Plan: -Will increase patient's Zoloft to 200 mg/day -Introduced patient to Dr. Jolyne Loa who plans to schedule appointments for counseling for the patient -No SI/HI today, provided suicide resources just in case -Recommend follow-up in 4 weeks

## 2020-07-31 NOTE — Telephone Encounter (Signed)
I have provided this to them once already. I will send again.

## 2020-07-31 NOTE — Assessment & Plan Note (Signed)
Assessment: Patient with a history of fatigue for several years now.  States he had a friend who abused opioids as well and has testosterone checked and found that this was the cause of his fatigue.  States his friend feels much better after starting testosterone replacement therapy and so he request to have this checked today.  Patient is not interested in checking other fatigue labs at this time.  Overall assessment patient's fatigue is broad and can include poor sleep, lack of exercise, stress, depression, low testosterone, vitamin/mineral deficiencies. Plan: -We will check patient's testosterone today as requested -Did discuss with patient prior to ordering the lab that should we check the testosterone level and it be low, testosterone replacement dose, at increased risk such as hyperlipidemia, hypertension, increased incidences of cancer, possibility of increased blood clots.  Patient states he understands these risk but believes his life expectancy may be less than the average due to his past medical history and feels he would be interested to know if his testosterone was low and would be interested in replacement if so.

## 2020-08-01 ENCOUNTER — Telehealth: Payer: Self-pay | Admitting: Psychology

## 2020-08-01 ENCOUNTER — Telehealth: Payer: Self-pay

## 2020-08-01 ENCOUNTER — Ambulatory Visit: Payer: Medicaid Other | Attending: Family Medicine

## 2020-08-01 ENCOUNTER — Encounter: Payer: Self-pay | Admitting: Family Medicine

## 2020-08-01 VITALS — BP 104/68 | HR 72

## 2020-08-01 DIAGNOSIS — R293 Abnormal posture: Secondary | ICD-10-CM | POA: Insufficient documentation

## 2020-08-01 DIAGNOSIS — M6281 Muscle weakness (generalized): Secondary | ICD-10-CM | POA: Insufficient documentation

## 2020-08-01 DIAGNOSIS — R2689 Other abnormalities of gait and mobility: Secondary | ICD-10-CM | POA: Diagnosis not present

## 2020-08-01 NOTE — Therapy (Signed)
[facility] [address] [place], [place], [address] Phone: [phone]   Fax:  [phone]  Physical Therapy Treatment  Patient Details  Name: Wesley Fuentes MRN: [id number] Date of Birth: 11-22-1976 Referring Provider (PT): Andrena Mews, MD referred by PCP is Lurline Del   Encounter Date: 08/01/2020   PT End of Session - 08/01/20 1405    Visit Number 3    Number of Visits 17   saving 3 visits after 12 weeks with thought OT may use about 8   Authorization Type 3 visits 7/19 to 8/8    Authorization - Visit Number 2    Authorization - Number of Visits 3    PT Start Time 1401    PT Stop Time 1456    PT Time Calculation (min) 55 min    Equipment Utilized During Treatment Gait belt   right platform walker   Activity Tolerance Patient tolerated treatment well    Behavior During Therapy WFL for tasks assessed/performed           Past Medical History:  Diagnosis Date  . CHB (complete heart block) ([place])   . Depression 12/11/2017  . Hepatitis C   . History of complete heart block 12/11/2017  . 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 Opioid use disorder, moderate, in early remission ([place]) 12/11/2017    Past Surgical History:  Procedure Laterality Date  . Permanent pacemaker placement     On 11/24/17-had [facility] Jude dual-chamber permanent pacemaker placed.  . Tricuspid valve replacement     On 11/17/17-replacement of tricuspid valve with a 29 mm Medtronic Mosaic mitral prosthesis.    Vitals:   08/01/20 1418  BP: 104/68  Pulse: 72  SpO2: 92%     Subjective Assessment - 08/01/20 1406    Subjective Pt reports that he has been working on walking with family. Does one longer walk about 300' on driveway with a couple standing rest breaks and one other shorter walk. Wearing legs 4 hours with 2 hours in morning and 2 in evening.    Pertinent History Complete heart block,  pacemaker 2018, Tricuspid valve replacements 2018, Bilateral TTAs & hand amputation 2020. Hep C,    Patient Stated Goals Pt wants to be able to get back to his hobbies and work. Wants to be able to walk by September when going back to [place].    Currently in Pain? No/denies                             [facility] Adult PT Treatment/Exercise - 08/01/20 1413      Transfers   Transfers Sit to Stand;Stand to Sit    Sit to Stand 4: Min guard    Stand to Sit 4: Min guard      Ambulation/Gait   Ambulation/Gait Yes    Ambulation/Gait Assistance 4: Min guard    Ambulation/Gait Assistance Details Pt was given verbal and tactile cues for more erect posture staying up in walker. Also cued to try to hit with heels first. During second bout lowered walker height  and pt was able to relax shoulders better. O2 sat=93% and HR=70 after    Ambulation Distance (Feet) 115 Feet   115' x 1 after adjusted platform height   Assistive device Right platform walker;Prostheses    Gait Pattern Step-through pattern;Decreased step length - right;Decreased step length - left    Ambulation Surface  Level;Indoor      Neuro Re-ed    Neuro Re-ed Details  Pt stood at walker without UE x 1 min 6 sec with CGA. At counter: side stepping with LUE support 6' x 4.       Prosthetics   Prosthetic Care Comments  Pt has not been wearing any socks. Discussed when a sock would be beneficial like if having pain from increased pressure on residual limb, pain on patella in weight bearing from sitting down too far in socket on patellar bar.    Current prosthetic wear tolerance (days/week)  daily    Current prosthetic wear tolerance (#hours/day)  2 hours in morning and 2 hours in pm for 4 hours total    Residual limb condition  intact    Education Provided Skin check;Correct ply sock adjustment;Proper wear schedule/adjustment    Person(s) Educated Patient;Parent(s)    Education Method Explanation    Donning Prosthesis Supervision     Doffing Prosthesis Supervision                  PT Education - 08/01/20 2137    Education Details Pt was instructed to add side stepping at counter and standing at walker with chair behind him without UE support working on balance daily. Discussed increasing prosthetic wear time to 6 hours total with 3 in morning nd 3 in pm.    Person(s) Educated Patient;Parent(s)    Methods Explanation    Comprehension Verbalized understanding            PT Short Term Goals - 08/01/20 2138      PT SHORT TERM GOAL #1   Title Pt will be independent with donning bilateral prostheses for improved independence.    Baseline supervision with donning.    Time 4    Period Weeks    Status Partially Met    Target Date 09/06/20      PT SHORT TERM GOAL #2   Title Pt will be able to tolerate wearing prosthesis for 8 hours/day.    Baseline currently 4 hours/day    Time 4    Period Weeks    Status On-going    Target Date 09/06/20      PT SHORT TERM GOAL #3   Title Pt will be able to maintain balance in standing x 1 min without UE support for improved balance and standing ADLs    Baseline 1 min 6 sec without UE support CGA    Time 4    Period Weeks    Status Achieved    Target Date 08/06/20      PT SHORT TERM GOAL #4   Title Pt will ambulate 100' with bilateral prosthesis and platform RW CGA for improved mobility in home.    Baseline 115' with bilateral prostheses and right platform walker CGA    Time 4    Period Weeks    Status Achieved    Target Date 08/06/20      PT SHORT TERM GOAL #5   Title Pt will be mod I with sit to stand transfers for improved mobility and functional strength.    Baseline CGA with sit to stand transfers    Time 4    Period Weeks    Status On-going    Target Date 09/06/20             PT Long Term Goals - 07/07/20 2026      PT LONG TERM GOAL #1   Title Pt will  be independent with prosthetic care for improved function.    Baseline dependent    Time  12    Period Weeks    Status New    Target Date 10/05/20      PT LONG TERM GOAL #2   Title Pt will report wearing prostheses all awake hours for improved function.    Baseline curently 1 hour 3x/day    Time 12    Period Weeks    Status New    Target Date 10/05/20      PT LONG TERM GOAL #3   Title Pt will be able to perform Berg Balance increasing score from 4/56 to >40/56 for improved balance and decreased fall risk.    Baseline 4/56    Time 12    Period Weeks    Status New    Target Date 10/05/20      PT LONG TERM GOAL #4   Title Pt will ambulate >500' with LRAD mod I for improved short community distances.    Baseline 20' min assist with right platform RW    Time 12    Period Weeks    Status New    Target Date 10/05/20      PT LONG TERM GOAL #5   Title Pt will be able to negotiate up/down curb/ramp with LRAD and stairs with railing mod I for improved community access.    Baseline unable to perform currently    Time 12    Period Weeks    Status New    Target Date 10/05/20                 Plan - 08/01/20 2142    Clinical Impression Statement Pt has shown significant improvements in transfers and gait the last 2 weeks. He increased gait distance with less assistance. Focused on trying to get more upright posture. Pt was able to get more reciprocal pattern. Vitals responded well with only slight drop in O2 today. Pt requires decreased assistance with transfers. He was able to meet standing goal of >1 min without UE support CGA. Pt will continue to benefit from skilled PT to continue to improve gait and functional mobility.    Personal Factors and Comorbidities Comorbidity 3+    Comorbidities Complete heart block, pacemaker 2018, Tricuspid valve replacements 2018, Bilateral TTAs & hand amputation 2020. Hep C,    Examination-Activity Limitations Locomotion Level;Stand;Transfers;Stairs    Examination-Participation Restrictions Community Activity;Yard Work     Stability/Clinical Decision Making Evolving/Moderate complexity    Rehab Potential Good    PT Frequency 1x / week   followed by 2x/week for 4 weeks then 1x/week for 4 weeks   PT Duration 4 weeks    PT Treatment/Interventions ADLs/Self Care Home Management;Gait training;Stair training;Functional mobility training;Therapeutic activities;DME Instruction;Neuromuscular re-education;Balance training;Therapeutic exercise;Patient/family education;Prosthetic Training;Manual techniques;Passive range of motion;Energy conservation    PT Next Visit Plan Did OT order come back as requested last visit. Transfer training with sit to stand at platform walker, weight shifting then gait training. Monitor vitals closely due to extensive cardiac history.    Consulted and Agree with Plan of Care Patient;Family member/caregiver    Family Member Consulted mother, Gerome Sam           Patient will benefit from skilled therapeutic intervention in order to improve the following deficits and impairments:  Abnormal gait, Cardiopulmonary status limiting activity, Decreased activity tolerance, Decreased balance, Decreased endurance, Decreased mobility, Decreased range of motion, Difficulty walking, Decreased strength, Decreased knowledge of  use of DME, Pain, Prosthetic Dependency  Visit Diagnosis: Other abnormalities of gait and mobility  Muscle weakness (generalized)  Abnormal posture     Problem List Patient Active Problem List   Diagnosis Date Noted  . Fatigue 07/31/2020  . 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 Educated about COVID-19 virus infection 05/11/2020  . S/P bilateral below knee amputation ([place]) 05/09/2020  . Amputation of right arm ([place]) 05/09/2020  . History of bacterial endocarditis 05/02/2020  . 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 Complete heart block ([place]) 12/11/2017  . 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 Opioid use disorder,  moderate, in early remission ([place]) 12/11/2017  . Depression 12/11/2017    Electa Sniff, PT, DPT, NCS 08/01/2020, 9:47 PM  [facility] [address] [place], [place], [address] Phone: [phone]   Fax:  [phone]  Name: AYHAM WORD MRN: [id number] Date of Birth: 05-04-1976

## 2020-08-01 NOTE — Telephone Encounter (Signed)
Contacted [facility] to see what is needed.  They have received the script and the CMN form, but they are still in need of letter or OVN.  Dr. Atha Starks created letter and that was faxed to [facility].   Jone Baseman, CMA

## 2020-08-01 NOTE — Telephone Encounter (Signed)
Scheduled in [facility] appt for 8/16 at 1130

## 2020-08-01 NOTE — Telephone Encounter (Signed)
Dr. Atha Starks, Wesley Fuentes is currently being seen by PT for prosthetic training.  The patient would benefit from OT  evaluation for right UE prosthetic training as will be receiving next week.   If you agree, please place an order in [facility] workque in [facility] or fax the order to [phone]. Thank you, [name], PT, DPT, NCS   [facility] [address] [place], [place]  [address] Phone:  [phone] Fax:  [phone]

## 2020-08-02 ENCOUNTER — Other Ambulatory Visit: Payer: Self-pay | Admitting: Family Medicine

## 2020-08-02 DIAGNOSIS — Z89512 Acquired absence of left leg below knee: Secondary | ICD-10-CM

## 2020-08-02 DIAGNOSIS — Z89511 Acquired absence of right leg below knee: Secondary | ICD-10-CM

## 2020-08-02 DIAGNOSIS — S48911D Complete traumatic amputation of right shoulder and upper arm, level unspecified, subsequent encounter: Secondary | ICD-10-CM

## 2020-08-02 NOTE — Telephone Encounter (Signed)
Thank you :)

## 2020-08-02 NOTE — Telephone Encounter (Signed)
Order for OT placed.

## 2020-08-03 ENCOUNTER — Ambulatory Visit: Payer: Medicaid Other | Admitting: Physical Therapy

## 2020-08-04 ENCOUNTER — Other Ambulatory Visit: Payer: Medicaid Other

## 2020-08-04 ENCOUNTER — Telehealth: Payer: Self-pay | Admitting: Family Medicine

## 2020-08-04 ENCOUNTER — Other Ambulatory Visit: Payer: Self-pay

## 2020-08-04 ENCOUNTER — Other Ambulatory Visit: Payer: Self-pay | Admitting: Family Medicine

## 2020-08-04 DIAGNOSIS — R5383 Other fatigue: Secondary | ICD-10-CM

## 2020-08-04 DIAGNOSIS — J3489 Other specified disorders of nose and nasal sinuses: Secondary | ICD-10-CM

## 2020-08-04 NOTE — Telephone Encounter (Signed)
Patients mother is needing a referral for ENT because patient has problems with inside of nose, from when he was septic. ENT is [facility]. Office fax number is [phone]. Going August 26th at 10.Jazmin Thanks

## 2020-08-04 NOTE — Telephone Encounter (Signed)
Will forward to PCP as patient already has an appt but just needs the referral.  Trek Kimball,CMA

## 2020-08-05 LAB — TESTOSTERONE: Testosterone: 318 ng/dL (ref 264–916)

## 2020-08-06 LAB — HCV RNA QUANT RFLX ULTRA OR GENOTYP: HCV Quant Baseline: NOT DETECTED IU/mL

## 2020-08-08 ENCOUNTER — Other Ambulatory Visit: Payer: Self-pay

## 2020-08-08 ENCOUNTER — Ambulatory Visit: Payer: Medicaid Other

## 2020-08-08 ENCOUNTER — Encounter: Payer: Medicaid Other | Admitting: Physical Therapy

## 2020-08-08 VITALS — BP 110/68 | HR 74

## 2020-08-08 DIAGNOSIS — R2689 Other abnormalities of gait and mobility: Secondary | ICD-10-CM

## 2020-08-08 DIAGNOSIS — M6281 Muscle weakness (generalized): Secondary | ICD-10-CM

## 2020-08-08 NOTE — Therapy (Signed)
[facility] [address] [place], [place], [address] Phone: [phone]   Fax:  [phone]  Physical Therapy Treatment  Patient Details  Name: Wesley Fuentes MRN: [id number] Date of Birth: 1976-08-06 Referring Provider (PT): Andrena Mews, MD referred by PCP is Lurline Del   Encounter Date: 08/08/2020   PT End of Session - 08/08/20 1405    Visit Number 4    Number of Visits 17   saving 3 visits after 12 weeks with thought OT may use about 8   Authorization Type 3 visits 7/19 to 8/8, 12 visits 8/10-10/4    Authorization - Visit Number 1    Authorization - Number of Visits 12    PT Start Time 1400    PT Stop Time 1440    PT Time Calculation (min) 40 min    Equipment Utilized During Treatment Gait belt   right platform walker   Activity Tolerance Patient tolerated treatment well    Behavior During Therapy WFL for tasks assessed/performed           Past Medical History:  Diagnosis Date  . CHB (complete heart block) ([place])   . Depression 12/11/2017  . Hepatitis C   . History of complete heart block 12/11/2017  . 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 Opioid use disorder, moderate, in early remission ([place]) 12/11/2017    Past Surgical History:  Procedure Laterality Date  . Permanent pacemaker placement     On 11/24/17-had [facility] Jude dual-chamber permanent pacemaker placed.  . Tricuspid valve replacement     On 11/17/17-replacement of tricuspid valve with a 29 mm Medtronic Mosaic mitral prosthesis.    Vitals:   08/08/20 1409  BP: 110/68  Pulse: 74  SpO2: 92%     Subjective Assessment - 08/08/20 1405    Subjective Pt reports that he noticed a red spot the other day on right residual limb. It opened up with small place on distal tibia that looks like blister opened. He saw prothetist today and they thought it looked like a blister. Pt is wearing prosthetics about 3  hours a day and walking each day prior to that. Has not walked since noticed place on leg.    Pertinent History Complete heart block, pacemaker 2018, Tricuspid valve replacements 2018, Bilateral TTAs & hand amputation 2020. Hep C,    Patient Stated Goals Pt wants to be able to get back to his hobbies and work. Wants to be able to walk by September when going back to [place].    Currently in Pain? No/denies                             [facility] Adult PT Treatment/Exercise - 08/08/20 1408      Transfers   Transfers Sit to Stand;Stand to Sit    Sit to Stand 4: Min guard;4: Min assist    Stand to Sit 4: Min guard      Ambulation/Gait   Ambulation/Gait Yes    Ambulation/Gait Assistance 4: Min guard    Ambulation/Gait Assistance Details Pt instructed to stay up tall inside walker. Pt took a couple standing rest breaks as more fatigued today. Checked skin after and no change with tegaderm still in place. Checked skin after gait and not change with tegaderm still in place.    Ambulation Distance (Feet) 115 Feet    Assistive device Right platform walker  Gait Pattern Step-through pattern    Ambulation Surface Level;Indoor      Prosthetics   Current prosthetic wear tolerance (days/week)  daily    Current prosthetic wear tolerance (#hours/day)  reports wearing 3 hours/day but upon further prying found that pt wearing liner all day and not taking off to air out prior to adding prostheses in afternoon. Advised pt that wear time includes liner wear as well and could be contributing to blister.  Discussed dropping back to 2 hours in morning and 2 hours in evening with removing liner in between.  Pt educated on importance of removing liner and patting limb try between bouts as well. Also discussed antiperspirant use on residual limb if having excessive sweating issues. PT applied tegaderm to open place and instructed to keep on when during the day when wearing prosthesis and take off at night to  allow it to air out. Also discussed possible need for sock if having too much pressure distally as may be sitting too far down in socket. To monitor condition of sore and instructed in signs/symptoms of infection to seek medical help fast if should occur. Pt verbalized understanding.    Residual limb condition  pt has small blister to right distal tibia with minimal serous drainage noted.    Education Provided Skin check;Residual limb care;Correct ply sock adjustment;Proper wear schedule/adjustment    Person(s) Educated Patient;Parent(s)    Education Method Explanation;Demonstration    Education Method Verbalized understanding    Donning Prosthesis Supervision    Doffing Prosthesis Supervision                  PT Education - 08/08/20 2115    Education Details See prosthetic care comments for education.    Person(s) Educated Patient;Parent(s)    Methods Explanation    Comprehension Verbalized understanding            PT Short Term Goals - 08/01/20 2138      PT SHORT TERM GOAL #1   Title Pt will be independent with donning bilateral prostheses for improved independence.    Baseline supervision with donning.    Time 4    Period Weeks    Status Partially Met    Target Date 09/06/20      PT SHORT TERM GOAL #2   Title Pt will be able to tolerate wearing prosthesis for 8 hours/day.    Baseline currently 4 hours/day    Time 4    Period Weeks    Status On-going    Target Date 09/06/20      PT SHORT TERM GOAL #3   Title Pt will be able to maintain balance in standing x 1 min without UE support for improved balance and standing ADLs    Baseline 1 min 6 sec without UE support CGA    Time 4    Period Weeks    Status Achieved    Target Date 08/06/20      PT SHORT TERM GOAL #4   Title Pt will ambulate 100' with bilateral prosthesis and platform RW CGA for improved mobility in home.    Baseline 115' with bilateral prostheses and right platform walker CGA    Time 4    Period  Weeks    Status Achieved    Target Date 08/06/20      PT SHORT TERM GOAL #5   Title Pt will be mod I with sit to stand transfers for improved mobility and functional strength.    Baseline  CGA with sit to stand transfers    Time 4    Period Weeks    Status On-going    Target Date 09/06/20             PT Long Term Goals - 07/07/20 2026      PT LONG TERM GOAL #1   Title Pt will be independent with prosthetic care for improved function.    Baseline dependent    Time 12    Period Weeks    Status New    Target Date 10/05/20      PT LONG TERM GOAL #2   Title Pt will report wearing prostheses all awake hours for improved function.    Baseline curently 1 hour 3x/day    Time 12    Period Weeks    Status New    Target Date 10/05/20      PT LONG TERM GOAL #3   Title Pt will be able to perform Berg Balance increasing score from 4/56 to >40/56 for improved balance and decreased fall risk.    Baseline 4/56    Time 12    Period Weeks    Status New    Target Date 10/05/20      PT LONG TERM GOAL #4   Title Pt will ambulate >500' with LRAD mod I for improved short community distances.    Baseline 20' min assist with right platform RW    Time 12    Period Weeks    Status New    Target Date 10/05/20      PT LONG TERM GOAL #5   Title Pt will be able to negotiate up/down curb/ramp with LRAD and stairs with railing mod I for improved community access.    Baseline unable to perform currently    Time 12    Period Weeks    Status New    Target Date 10/05/20                 Plan - 08/08/20 1529    Clinical Impression Statement Pt developed blister that opened at right distal tibia. No signs of infection noted with minimal serous drainage. PT applied tegaderm and supplied pt with some extra. It appears pt wearing liner excessively and not allowing limb to air out and too much moisture developing that may have contributed. Education on proper wear time including liner in time  again. Pt did fatigue quicker with gait today.    Personal Factors and Comorbidities Comorbidity 3+    Comorbidities Complete heart block, pacemaker 2018, Tricuspid valve replacements 2018, Bilateral TTAs & hand amputation 2020. Hep C,    Examination-Activity Limitations Locomotion Level;Stand;Transfers;Stairs    Examination-Participation Restrictions Community Activity;Yard Work    Stability/Clinical Decision Making Evolving/Moderate complexity    Rehab Potential Good    PT Frequency 1x / week   followed by 2x/week for 4 weeks then 1x/week for 4 weeks   PT Duration 4 weeks    PT Treatment/Interventions ADLs/Self Care Home Management;Gait training;Stair training;Functional mobility training;Therapeutic activities;DME Instruction;Neuromuscular re-education;Balance training;Therapeutic exercise;Patient/family education;Prosthetic Training;Manual techniques;Passive range of motion;Energy conservation    PT Next Visit Plan Did OT get scheduled yet? Need to schedule more PT visits. Check skin as open blister right distal tibia. Transfer training with sit to stand at platform walker, weight shifting then gait training. Monitor vitals closely due to extensive cardiac history.    Consulted and Agree with Plan of Care Patient;Family member/caregiver    Family Member Consulted mother, Britta Mccreedy  C[place]           Patient will benefit from skilled therapeutic intervention in order to improve the following deficits and impairments:  Abnormal gait, Cardiopulmonary status limiting activity, Decreased activity tolerance, Decreased balance, Decreased endurance, Decreased mobility, Decreased range of motion, Difficulty walking, Decreased strength, Decreased knowledge of use of DME, Pain, Prosthetic Dependency  Visit Diagnosis: Other abnormalities of gait and mobility  Muscle weakness (generalized)     Problem List Patient Active Problem List   Diagnosis Date Noted  . Fatigue 07/31/2020  . S/P TVR  (tricuspid valve replacement) 05/11/2020  . Educated about COVID-19 virus infection 05/11/2020  . S/P bilateral below knee amputation ([place]) 05/09/2020  . Amputation of right arm ([place]) 05/09/2020  . History of bacterial endocarditis 05/02/2020  . 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 Complete heart block ([place]) 12/11/2017  . 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 Opioid use disorder, moderate, in early remission ([place]) 12/11/2017  . Depression 12/11/2017    Electa Sniff, PT, DPT, NCS 08/08/2020, 9:18 PM  [facility] [address] [place], [place], [address] Phone: [phone]   Fax:  [phone]  Name: EMMERT ROETHLER MRN: [id number] Date of Birth: 26-Aug-1976

## 2020-08-14 ENCOUNTER — Ambulatory Visit (INDEPENDENT_AMBULATORY_CARE_PROVIDER_SITE_OTHER): Payer: Medicaid Other | Admitting: Psychology

## 2020-08-14 ENCOUNTER — Other Ambulatory Visit: Payer: Self-pay

## 2020-08-14 NOTE — BH Specialist Note (Signed)
Pt came for visit but stated he has found a support group he would like to try and therapy is not needed at this time.

## 2020-08-15 ENCOUNTER — Ambulatory Visit: Payer: Medicaid Other

## 2020-08-15 ENCOUNTER — Other Ambulatory Visit: Payer: Self-pay

## 2020-08-15 DIAGNOSIS — R293 Abnormal posture: Secondary | ICD-10-CM

## 2020-08-15 DIAGNOSIS — M6281 Muscle weakness (generalized): Secondary | ICD-10-CM

## 2020-08-15 DIAGNOSIS — R2689 Other abnormalities of gait and mobility: Secondary | ICD-10-CM

## 2020-08-15 NOTE — Therapy (Signed)
[facility] [address] [place], [place], [address] Phone: [phone]   Fax:  [phone]  Physical Therapy Treatment  Patient Details  Name: Wesley Fuentes MRN: [id number] Date of Birth: 1976-06-12 Referring Provider (PT): Wesley Pagan, MD referred by PCP is Wesley Fuentes   Encounter Date: 08/15/2020   PT End of Session - 08/15/20 1408    Visit Number 5    Number of Visits 17   saving 3 visits after 12 weeks with thought OT may use about 8   Authorization Type 3 visits 7/19 to 8/8, 12 visits 8/10-10/4    Authorization - Visit Number 2    Authorization - Number of Visits 12    PT Start Time 1404    PT Stop Time 1447    PT Time Calculation (min) 43 min    Equipment Utilized During Treatment Gait belt   right platform walker   Activity Tolerance Patient tolerated treatment well    Behavior During Therapy WFL for tasks assessed/performed           Past Medical History:  Diagnosis Date  . CHB (complete heart block) (HCC)   . Depression 12/11/2017  . Hepatitis C   . History of complete heart block 12/11/2017  . 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 Opioid use disorder, moderate, in early remission (HCC) 12/11/2017    Past Surgical History:  Procedure Laterality Date  . Permanent pacemaker placement     On 11/24/17-had [facility] Jude dual-chamber permanent pacemaker placed.  . Tricuspid valve replacement     On 11/17/17-replacement of tricuspid valve with a 29 mm Medtronic Mosaic mitral prosthesis.    There were no vitals filed for this visit.   Subjective Assessment - 08/15/20 1408    Subjective Pt reports that he is doing pretty good. He has been wearing liner/prosthetic  2 hours in morning and 2 hours in evening. Removing liner in between and drying leg off. Does have small blister still.    Pertinent History Complete heart block, pacemaker 2018, Tricuspid valve  replacements 2018, Bilateral TTAs & hand amputation 2020. Hep C,    Patient Stated Goals Pt wants to be able to get back to his hobbies and work. Wants to be able to walk by September when going back to [place].                             [facility] Adult PT Treatment/Exercise - 08/15/20 1429      Ambulation/Gait   Ambulation/Gait Yes    Ambulation/Gait Assistance 4: Min guard    Ambulation/Gait Assistance Details Pt was cued to be sure to hit with heels of shoe especially on right. Pt able to get more reciprocal pattern with more upright posture. HR=70 after    Ambulation Distance (Feet) 115 Feet    Assistive device Right platform walker;Prostheses    Gait Pattern Step-through pattern;Decreased step length - right;Decreased step length - left    Ambulation Surface Level;Indoor      Neuro Re-ed    Neuro Re-ed Details  Standing at walker: without UE support x 1 min, then added in head turns right/left x 10 and up/down x 10. Pt needed to take seated rest break between activities reporting he was just feeling a lot of pressure in residual limbs. CGA/min assist with looking up/down as tended to lose balance posterior at times. Standing at walker alternating taps  on 4" step x 10 each leg.      Prosthetics   Prosthetic Care Comments  PT educated pt on ways to help decide if correct sock ply adjustment. Pt did not have  any socks on upon arrival but did bring some 3ply and 5 ply today. Started with 3 ply on right and pt still felt some movement initially and patella sitting a little low. Switched to 5 ply and felt better. Discussed how patella should sit just above patellar tendon bar after he has been in WB and had pt stand and weight shift with each trial. Also discussed if feeling too much pressure distally may need to add a sock. Then trialed 8 ply just for patient to see too much and he could not even get limb all the way in. Pt will need continued work on correct sock ply adjustment. Did  let him know that it would vary day to day and throughout day as he was up on leg. Instructed to also check.    Current prosthetic wear tolerance (days/week)  daily    Current prosthetic wear tolerance (#hours/day)  wearing 2 hours in morning and 2 hours in evening. Now removing liner as well mid day and padding leg dry to allow skin to breath. Pt to continue with this currently until wound starts to heal. Will recheck on Thursday to see if we can go back up on time.    Current prosthetic weight-bearing tolerance (hours/day)  PT also discussed gradual increase in WB time. Reports currently WB about 20 minutes at home when up walking.    Residual limb condition  Pt still has small blister right distal tibia about dime sized with  red area and center yellow. No heat noted around area. Tegaderm in place. PT inspected after gait again and looked the same. No increased redness on right only slight on patellar tendon area. Left residual limb after gait had some mild pinkness throughout medial residual limb.    Education Provided Skin check;Correct ply sock adjustment;Proper wear schedule/adjustment    Person(s) Educated Patient;Parent(s)    Education Method Explanation;Demonstration    Education Method Verbalized understanding;Needs further instruction;Returned Therapist, music Prosthesis Supervision                  PT Education - 08/15/20 1509    Education Details See prosthetic care section. Sock ply education    Person(s) Educated Patient    Methods Explanation    Comprehension Verbalized understanding            PT Short Term Goals - 08/01/20 2138      PT SHORT TERM GOAL #1   Title Pt will be independent with donning bilateral prostheses for improved independence.    Baseline supervision with donning.    Time 4    Period Weeks    Status Partially Met    Target Date 09/06/20      PT SHORT TERM GOAL #2   Title Pt will be able to  tolerate wearing prosthesis for 8 hours/day.    Baseline currently 4 hours/day    Time 4    Period Weeks    Status On-going    Target Date 09/06/20      PT SHORT TERM GOAL #3   Title Pt will be able to maintain balance in standing x 1 min without UE support for improved balance and standing ADLs    Baseline 1 min 6  sec without UE support CGA    Time 4    Period Weeks    Status Achieved    Target Date 08/06/20      PT SHORT TERM GOAL #4   Title Pt will ambulate 100' with bilateral prosthesis and platform RW CGA for improved mobility in home.    Baseline 115' with bilateral prostheses and right platform walker CGA    Time 4    Period Weeks    Status Achieved    Target Date 08/06/20      PT SHORT TERM GOAL #5   Title Pt will be mod I with sit to stand transfers for improved mobility and functional strength.    Baseline CGA with sit to stand transfers    Time 4    Period Weeks    Status On-going    Target Date 09/06/20             PT Long Term Goals - 07/07/20 2026      PT LONG TERM GOAL #1   Title Pt will be independent with prosthetic care for improved function.    Baseline dependent    Time 12    Period Weeks    Status New    Target Date 10/05/20      PT LONG TERM GOAL #2   Title Pt will report wearing prostheses all awake hours for improved function.    Baseline curently 1 hour 3x/day    Time 12    Period Weeks    Status New    Target Date 10/05/20      PT LONG TERM GOAL #3   Title Pt will be able to perform Berg Balance increasing score from 4/56 to >40/56 for improved balance and decreased fall risk.    Baseline 4/56    Time 12    Period Weeks    Status New    Target Date 10/05/20      PT LONG TERM GOAL #4   Title Pt will ambulate >500' with LRAD mod I for improved short community distances.    Baseline 20' min assist with right platform RW    Time 12    Period Weeks    Status New    Target Date 10/05/20      PT LONG TERM GOAL #5   Title Pt  will be able to negotiate up/down curb/ramp with LRAD and stairs with railing mod I for improved community access.    Baseline unable to perform currently    Time 12    Period Weeks    Status New    Target Date 10/05/20                 Plan - 08/15/20 1510    Clinical Impression Statement Pt continues to have small wound right distal tibia. PT worked on sock ply adjustment education as pt still has questions about this. Ended up with 3 ply at end of session as after gait pt reported feeling too much pressure around leg. Pt showed improved stamina with gait not needed to take any standing rest breaks today. Standing balance without UE support continues to improve and added in head movements today to further challenge him.    Personal Factors and Comorbidities Comorbidity 3+    Comorbidities Complete heart block, pacemaker 2018, Tricuspid valve replacements 2018, Bilateral TTAs & hand amputation 2020. Hep C,    Examination-Activity Limitations Locomotion Level;Stand;Transfers;Stairs    Examination-Participation Restrictions Community Activity;[employer] Work  Stability/Clinical Decision Making Evolving/Moderate complexity    Rehab Potential Good    PT Frequency 1x / week   followed by 2x/week for 4 weeks then 1x/week for 4 weeks   PT Duration 4 weeks    PT Treatment/Interventions ADLs/Self Care Home Management;Gait training;Stair training;Functional mobility training;Therapeutic activities;DME Instruction;Neuromuscular re-education;Balance training;Therapeutic exercise;Patient/family education;Prosthetic Training;Manual techniques;Passive range of motion;Energy conservation    PT Next Visit Plan Need to schedule more PT visits. Check skin as open blister right distal tibia. Increase wear time to 3 hours x 2 if improving. Transfer training with sit to stand at platform walker, weight shifting then gait training. Monitor vitals closely due to extensive cardiac history.    Consulted and Agree with  Plan of Care Patient;Family member/caregiver    Family Member Consulted mother, Gerome Sam           Patient will benefit from skilled therapeutic intervention in order to improve the following deficits and impairments:  Abnormal gait, Cardiopulmonary status limiting activity, Decreased activity tolerance, Decreased balance, Decreased endurance, Decreased mobility, Decreased range of motion, Difficulty walking, Decreased strength, Decreased knowledge of use of DME, Pain, Prosthetic Dependency  Visit Diagnosis: Other abnormalities of gait and mobility  Muscle weakness (generalized)  Abnormal posture     Problem List Patient Active Problem List   Diagnosis Date Noted  . Fatigue 07/31/2020  . 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 Educated about COVID-19 virus infection 05/11/2020  . S/P bilateral below knee amputation ([place]) 05/09/2020  . 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 History of bacterial endocarditis 05/02/2020  . 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 Complete heart block ([place]) 12/11/2017  . 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 Opioid use disorder, moderate, in early remission ([place]) 12/11/2017  . Depression 12/11/2017    Electa Sniff, PT, DPT, NCS 08/15/2020, 3:14 PM  [facility] [address] [place], [place], [address] Phone: [phone]   Fax:  [phone]  Name: Wesley Fuentes MRN: [id number] Date of Birth: October 07, 1976

## 2020-08-17 ENCOUNTER — Encounter: Payer: Medicaid Other | Admitting: Physical Therapy

## 2020-08-17 ENCOUNTER — Ambulatory Visit: Payer: Medicaid Other

## 2020-08-22 ENCOUNTER — Other Ambulatory Visit: Payer: Self-pay

## 2020-08-22 ENCOUNTER — Encounter: Payer: Self-pay | Admitting: Physical Therapy

## 2020-08-22 ENCOUNTER — Ambulatory Visit: Payer: Medicaid Other | Admitting: Physical Therapy

## 2020-08-23 NOTE — Therapy (Signed)
[facility] [address] [place], [place], [address] Phone: [phone]   Fax:  [phone]  Physical Therapy Treatment  Patient Details  Name: KYRE JEFFRIES MRN: [id number] Date of Birth: 1976/03/05 Referring Provider (PT): Andrena Mews, MD referred by PCP is Lurline Del   Encounter Date: 08/22/2020   PT End of Session - 08/22/20 1413    Visit Number 6    Number of Visits 17   saving 3 visits after 12 weeks with thought OT may use about 8   Authorization Type 3 visits 7/19 to 8/8, 12 visits 8/10-10/4    Authorization - Visit Number 3    Authorization - Number of Visits 12    PT Start Time 8341    PT Stop Time 9622   arrived no charge   PT Time Calculation (min) 13 min    Equipment Utilized During Treatment Gait belt   right platform walker   Activity Tolerance Patient tolerated treatment well    Behavior During Therapy WFL for tasks assessed/performed           Past Medical History:  Diagnosis Date  . CHB (complete heart block) ([place])   . Depression 12/11/2017  . Hepatitis C   . History of complete heart block 12/11/2017  . 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 Opioid use disorder, moderate, in early remission ([place]) 12/11/2017    Past Surgical History:  Procedure Laterality Date  . Permanent pacemaker placement     On 11/24/17-had [facility] Jude dual-chamber permanent pacemaker placed.  . Tricuspid valve replacement     On 11/17/17-replacement of tricuspid valve with a 29 mm Medtronic Mosaic mitral prosthesis.    There were no vitals filed for this visit.   Subjective Assessment - 08/22/20 1413    Patient is accompained by: Family member    Pertinent History Complete heart block, pacemaker 2018, Tricuspid valve replacements 2018, Bilateral TTAs & hand amputation 2020. Hep C,    Limitations Standing;Walking;House hold activities                 PT  Short Term Goals - 08/01/20 2138      PT SHORT TERM GOAL #1   Title Pt will be independent with donning bilateral prostheses for improved independence.    Baseline supervision with donning.    Time 4    Period Weeks    Status Partially Met    Target Date 09/06/20      PT SHORT TERM GOAL #2   Title Pt will be able to tolerate wearing prosthesis for 8 hours/day.    Baseline currently 4 hours/day    Time 4    Period Weeks    Status On-going    Target Date 09/06/20      PT SHORT TERM GOAL #3   Title Pt will be able to maintain balance in standing x 1 min without UE support for improved balance and standing ADLs    Baseline 1 min 6 sec without UE support CGA    Time 4    Period Weeks    Status Achieved    Target Date 08/06/20      PT SHORT TERM GOAL #4   Title Pt will ambulate 100' with bilateral prosthesis and platform RW CGA for improved mobility in home.    Baseline 115' with bilateral prostheses and right platform walker CGA    Time 4    Period Weeks  Status Achieved    Target Date 08/06/20      PT SHORT TERM GOAL #5   Title Pt will be mod I with sit to stand transfers for improved mobility and functional strength.    Baseline CGA with sit to stand transfers    Time 4    Period Weeks    Status On-going    Target Date 09/06/20             PT Long Term Goals - 07/07/20 2026      PT LONG TERM GOAL #1   Title Pt will be independent with prosthetic care for improved function.    Baseline dependent    Time 12    Period Weeks    Status New    Target Date 10/05/20      PT LONG TERM GOAL #2   Title Pt will report wearing prostheses all awake hours for improved function.    Baseline curently 1 hour 3x/day    Time 12    Period Weeks    Status New    Target Date 10/05/20      PT LONG TERM GOAL #3   Title Pt will be able to perform Berg Balance increasing score from 4/56 to >40/56 for improved balance and decreased fall risk.    Baseline 4/56    Time 12     Period Weeks    Status New    Target Date 10/05/20      PT LONG TERM GOAL #4   Title Pt will ambulate >500' with LRAD mod I for improved short community distances.    Baseline 20' min assist with right platform RW    Time 12    Period Weeks    Status New    Target Date 10/05/20      PT LONG TERM GOAL #5   Title Pt will be able to negotiate up/down curb/ramp with LRAD and stairs with railing mod I for improved community access.    Baseline unable to perform currently    Time 12    Period Weeks    Status New    Target Date 10/05/20                 Plan - 08/22/20 1413    Clinical Impression Statement Pt reporting that he has been having "puss' come out of wound on right limb. Wound checked and edges found to be endurated. PTA able to get scant amount of puss drainage out of wound, which pt reports getting lots more at home. Wound measured: 0.3 cm depth, 0.2 cm length, 0.2 cm width. Pt advised that due to drainage, wound appearance and history of infection in limbs he should follow up with his MD before continuing with prosthetic training. Advised pt to not wear liner/prosthesis at this time. Pt is to schedule appt with his MD (mom planned to go by office today). Pt to call if not seen by his next appt. Pt advised to cover wound with Tegaderm when out in community and leave open when in his home.    Personal Factors and Comorbidities Comorbidity 3+    Comorbidities Complete heart block, pacemaker 2018, Tricuspid valve replacements 2018, Bilateral TTAs & hand amputation 2020. Hep C,    Examination-Activity Limitations Locomotion Level;Stand;Transfers;Stairs    Examination-Participation Restrictions Community Activity;Yard Work    Stability/Clinical Decision Making Evolving/Moderate complexity    Rehab Potential Good    PT Frequency 1x / week   followed by 2x/week  for 4 weeks then 1x/week for 4 weeks   PT Duration 4 weeks    PT Treatment/Interventions ADLs/Self Care Home  Management;Gait training;Stair training;Functional mobility training;Therapeutic activities;DME Instruction;Neuromuscular re-education;Balance training;Therapeutic exercise;Patient/family education;Prosthetic Training;Manual techniques;Passive range of motion;Energy conservation    PT Next Visit Plan Need to schedule more PT visits. what did MD say about wound on right limb? Stair training, ramp/curb with walker/prostheses, begin gait training with straight cane    Consulted and Agree with Plan of Care Patient;Family member/caregiver    Family Member Consulted mother, Gerome Sam           Patient will benefit from skilled therapeutic intervention in order to improve the following deficits and impairments:  Abnormal gait, Cardiopulmonary status limiting activity, Decreased activity tolerance, Decreased balance, Decreased endurance, Decreased mobility, Decreased range of motion, Difficulty walking, Decreased strength, Decreased knowledge of use of DME, Pain, Prosthetic Dependency  Visit Diagnosis: Other abnormalities of gait and mobility  Muscle weakness (generalized)  Abnormal posture     Problem List Patient Active Problem List   Diagnosis Date Noted  . Fatigue 07/31/2020  . 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 Educated about COVID-19 virus infection 05/11/2020  . S/P bilateral below knee amputation ([place]) 05/09/2020  . Amputation of right arm ([place]) 05/09/2020  . History of bacterial endocarditis 05/02/2020  . 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 Complete heart block ([place]) 12/11/2017  . 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 Opioid use disorder, moderate, in early remission ([place]) 12/11/2017  . Depression 12/11/2017    Willow Ora, PTA, [facility] [address], [place], [place] [address] [phone] 08/23/20, 8:24 AM   Name: KOBI MARIO MRN: [id number] Date of Birth:  09-26-1976

## 2020-08-24 ENCOUNTER — Ambulatory Visit: Payer: Medicaid Other | Admitting: Physical Therapy

## 2020-08-24 ENCOUNTER — Other Ambulatory Visit: Payer: Self-pay

## 2020-08-24 ENCOUNTER — Ambulatory Visit (INDEPENDENT_AMBULATORY_CARE_PROVIDER_SITE_OTHER): Payer: Medicaid Other | Admitting: Family Medicine

## 2020-08-24 ENCOUNTER — Encounter: Payer: Self-pay | Admitting: Family Medicine

## 2020-08-24 VITALS — BP 92/62 | HR 70

## 2020-08-24 DIAGNOSIS — Z899 Acquired absence of limb, unspecified: Secondary | ICD-10-CM | POA: Insufficient documentation

## 2020-08-24 DIAGNOSIS — S81802A Unspecified open wound, left lower leg, initial encounter: Secondary | ICD-10-CM | POA: Diagnosis not present

## 2020-08-24 MED ORDER — MUPIROCIN 2 % EX OINT: 1.0000 "application " | TOPICAL_OINTMENT | Freq: Two times a day (BID) | CUTANEOUS | 0 refills | Status: DC

## 2020-08-24 NOTE — Assessment & Plan Note (Signed)
Does not appear to be infected today.  No fluctuance noted.  Low suspicion for internal tract.  Advised ot avoid [place] of prosthesis until completely healed.  Can participate in PT.  Will prescribe bactroban to use if the area opens again and patient also advised is this happens to come in immediately for evaluation for oral antibiotics.  If appearance worsens, would consider XR for evaluation of possible tract formation.  RTC if does not heal or worsens, develops fever.

## 2020-08-24 NOTE — Progress Notes (Signed)
    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 male in NAD Lungs: Breathing comfortably on RA Abdomen: Soft, non-tender to palpation, non-distended, positive bowel sounds Skin: warm and dry, right leg with 86mm round lesion with mild surrounding erythema, overlying dry, scaling skin, no TTP, no fluctuance noted, underlying structures moved with flexion and extension of leg, see image below Extremities:  B/L BKA with right arm amputation       ASSESSMENT/PLAN:   Wound of left leg Does not appear to be infected today.  No fluctuance noted.  Low suspicion for internal tract.  Advised ot avoid [place] of prosthesis until completely healed.  Can participate in PT.  Will prescribe bactroban to use if the area opens again and patient also advised is this happens to come in immediately for evaluation for oral antibiotics.  If appearance worsens, would consider XR for evaluation of possible tract formation.  RTC if does not heal or worsens, develops fever.     Unknown Jim, DO [facility]

## 2020-08-24 NOTE — Patient Instructions (Signed)
Thank you for coming to see me today. It was a pleasure. Today we talked about:   Use the ointment if worsens and come to see [place] right away.  You can go back to PT.  Let the area heal before using the prosthesis again.  Please follow-up with your PCP as scheduled.  If you have any questions or concerns, please do not hesitate to call the office at [phone].  Best,   Luis Abed, DO

## 2020-08-29 ENCOUNTER — Ambulatory Visit: Payer: Medicaid Other

## 2020-08-29 ENCOUNTER — Other Ambulatory Visit: Payer: Self-pay

## 2020-08-29 VITALS — HR 72

## 2020-08-29 DIAGNOSIS — R2689 Other abnormalities of gait and mobility: Secondary | ICD-10-CM

## 2020-08-29 DIAGNOSIS — M6281 Muscle weakness (generalized): Secondary | ICD-10-CM

## 2020-08-29 DIAGNOSIS — R293 Abnormal posture: Secondary | ICD-10-CM

## 2020-08-29 NOTE — Therapy (Signed)
[facility] [address] [place], [place], [address] Phone: [phone]   Fax:  [phone]  Physical Therapy Treatment  Patient Details  Name: Wesley Fuentes MRN: [id number] Date of Birth: 1976-04-16 Referring Provider (PT): Andrena Mews, MD referred by PCP is Lurline Del   Encounter Date: 08/29/2020   PT End of Session - 08/29/20 1408    Visit Number 7    Number of Visits 17   saving 3 visits after 12 weeks with thought OT may use about 8   Authorization Type 3 visits 7/19 to 8/8, 12 visits 8/10-10/4    Authorization - Visit Number 4    Authorization - Number of Visits 12    PT Start Time 3818    PT Stop Time 1446    PT Time Calculation (min) 41 min    Equipment Utilized During Treatment Gait belt   right platform walker   Activity Tolerance Patient tolerated treatment well    Behavior During Therapy WFL for tasks assessed/performed           Past Medical History:  Diagnosis Date  . CHB (complete heart block) ([place])   . Depression 12/11/2017  . Hepatitis C   . History of complete heart block 12/11/2017  . 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 Opioid use disorder, moderate, in early remission ([place]) 12/11/2017    Past Surgical History:  Procedure Laterality Date  . Permanent pacemaker placement     On 11/24/17-had [facility] Jude dual-chamber permanent pacemaker placed.  . Tricuspid valve replacement     On 11/17/17-replacement of tricuspid valve with a 29 mm Medtronic Mosaic mitral prosthesis.    Vitals:   08/29/20 1413  Pulse: 72     Subjective Assessment - 08/29/20 1408    Subjective Pt saw doctor about leg. They were not concerned as did not appear infected and scabbed over. Pt reports that it does drain a little if he pushes on it taking all day to build up. Reports is pea sized when does and yellow colored. Was given antibiotic cream. Tegaderm in place.  Cleared to participate in PT per MD note.    Patient is accompained by: Family member    Pertinent History Complete heart block, pacemaker 2018, Tricuspid valve replacements 2018, Bilateral TTAs & hand amputation 2020. Hep C,    Patient Stated Goals Pt wants to be able to get back to his hobbies and work. Wants to be able to walk by September when going back to [place].    Currently in Pain? No/denies                             [facility] Adult PT Treatment/Exercise - 08/29/20 1412      Transfers   Transfers Sit to Stand;Stand to Sit    Sit to Stand 4: Min guard    Stand to Sit 4: Min guard      Ambulation/Gait   Ambulation/Gait Yes    Ambulation/Gait Assistance 4: Min guard    Ambulation/Gait Assistance Details Pt cued to stand tall in walker and try to hit with heels of feet.    Ambulation Distance (Feet) 115 Feet    Assistive device Right platform walker    Gait Pattern Step-through pattern    Ambulation Surface Level;Indoor    Gait Comments In // bars worked on gait with 1UE support 6' x 4 then with [place] with  quad tip 6' x 8 with seated rest break in between. Pt was given verbal cues for sequencing and CGA. Instructed to step past right foot with left foot after 1st lap with cane which pt did improve on with practice. Step-ups on 4" step with 1 UE support in // bars x 5 each leg CGA. More difficulty with stepping up with LLE as UE support on left. Pt needed seated rest break and to remove prosthesis between activities stating there was just so much pressure/cramping in distal limb.       Prosthetics   Prosthetic Care Comments  Pt has not worn legs since last week due to wound on right residual limb. Discussed starting back to 2 hours at a time then at least 1 hour off then 2 hours again. To inspect skin after each time and monitor wound closely. Continue tegaderm when prosthesis on but remove when prosthesis off to allow wound to air out. To monitor for signs of infection  including redness, swelling, warmth, colored drainage, fever. To notify MD immediately if any issues. Added 3 ply sock to right leg today as was sitting too low.    Residual limb condition  Pt still has small  wound to right distal tibia. Scabs over than opens back up. No redness noted around it today.    Education Provided Skin check;Residual limb care;Proper wear schedule/adjustment;Ply sock cleaning    Person(s) Educated Patient;Parent(s)    Education Method Explanation;Demonstration    Education Method Verbalized understanding    Donning Prosthesis Supervision    Doffing Prosthesis Supervision                    PT Short Term Goals - 08/01/20 2138      PT SHORT TERM GOAL #1   Title Pt will be independent with donning bilateral prostheses for improved independence.    Baseline supervision with donning.    Time 4    Period Weeks    Status Partially Met    Target Date 09/06/20      PT SHORT TERM GOAL #2   Title Pt will be able to tolerate wearing prosthesis for 8 hours/day.    Baseline currently 4 hours/day    Time 4    Period Weeks    Status On-going    Target Date 09/06/20      PT SHORT TERM GOAL #3   Title Pt will be able to maintain balance in standing x 1 min without UE support for improved balance and standing ADLs    Baseline 1 min 6 sec without UE support CGA    Time 4    Period Weeks    Status Achieved    Target Date 08/06/20      PT SHORT TERM GOAL #4   Title Pt will ambulate 100' with bilateral prosthesis and platform RW CGA for improved mobility in home.    Baseline 115' with bilateral prostheses and right platform walker CGA    Time 4    Period Weeks    Status Achieved    Target Date 08/06/20      PT SHORT TERM GOAL #5   Title Pt will be mod I with sit to stand transfers for improved mobility and functional strength.    Baseline CGA with sit to stand transfers    Time 4    Period Weeks    Status On-going    Target Date 09/06/20  PT Long Term Goals - 07/07/20 2026      PT LONG TERM GOAL #1   Title Pt will be independent with prosthetic care for improved function.    Baseline dependent    Time 12    Period Weeks    Status New    Target Date 10/05/20      PT LONG TERM GOAL #2   Title Pt will report wearing prostheses all awake hours for improved function.    Baseline curently 1 hour 3x/day    Time 12    Period Weeks    Status New    Target Date 10/05/20      PT LONG TERM GOAL #3   Title Pt will be able to perform Berg Balance increasing score from 4/56 to >40/56 for improved balance and decreased fall risk.    Baseline 4/56    Time 12    Period Weeks    Status New    Target Date 10/05/20      PT LONG TERM GOAL #4   Title Pt will ambulate >500' with LRAD mod I for improved short community distances.    Baseline 20' min assist with right platform RW    Time 12    Period Weeks    Status New    Target Date 10/05/20      PT LONG TERM GOAL #5   Title Pt will be able to negotiate up/down curb/ramp with LRAD and stairs with railing mod I for improved community access.    Baseline unable to perform currently    Time 12    Period Weeks    Status New    Target Date 10/05/20                 Plan - 08/29/20 1647    Clinical Impression Statement Pt able to progress gait today with starting [facility] training in // bars. Showing improving balance. Continues to have pain in residual limbs needing frequent breaks despite sock adjustment. If continues may need to follow-up with orthotist.    Personal Factors and Comorbidities Comorbidity 3+    Comorbidities Complete heart block, pacemaker 2018, Tricuspid valve replacements 2018, Bilateral TTAs & hand amputation 2020. Hep C,    Examination-Activity Limitations Locomotion Level;Stand;Transfers;Stairs    Examination-Participation Restrictions Community Activity;Yard Work    Stability/Clinical Decision Making Evolving/Moderate complexity    Rehab  Potential Good    PT Frequency 1x / week   followed by 2x/week for 4 weeks then 1x/week for 4 weeks   PT Duration 4 weeks    PT Treatment/Interventions ADLs/Self Care Home Management;Gait training;Stair training;Functional mobility training;Therapeutic activities;DME Instruction;Neuromuscular re-education;Balance training;Therapeutic exercise;Patient/family education;Prosthetic Training;Manual techniques;Passive range of motion;Energy conservation    PT Next Visit Plan Need to schedule more PT visits. Stair training, ramp/curb with walker/prostheses, begin gait training with straight cane    Consulted and Agree with Plan of Care Patient;Family member/caregiver    Family Member Consulted mother, Gerome Sam           Patient will benefit from skilled therapeutic intervention in order to improve the following deficits and impairments:  Abnormal gait, Cardiopulmonary status limiting activity, Decreased activity tolerance, Decreased balance, Decreased endurance, Decreased mobility, Decreased range of motion, Difficulty walking, Decreased strength, Decreased knowledge of use of DME, Pain, Prosthetic Dependency  Visit Diagnosis: Other abnormalities of gait and mobility  Muscle weakness (generalized)  Abnormal posture     Problem List Patient Active Problem List   Diagnosis Date Noted  .  Wound of left leg 08/24/2020  . Fatigue 07/31/2020  . 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 Educated about COVID-19 virus infection 05/11/2020  . S/P bilateral below knee amputation ([place]) 05/09/2020  . Amputation of right arm ([place]) 05/09/2020  . History of bacterial endocarditis 05/02/2020  . 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 Complete heart block ([place]) 12/11/2017  . 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 Opioid use disorder, moderate, in early remission ([place]) 12/11/2017  . Depression 12/11/2017    Electa Sniff, PT, DPT, NCS 08/29/2020,  4:53 PM  [facility] [address] [place], [place], [address] Phone: [phone]   Fax:  [phone]  Name: MOHID FURUYA MRN: [id number] Date of Birth: 28-Jul-1976

## 2020-08-30 ENCOUNTER — Ambulatory Visit: Payer: Medicaid Other | Attending: Family Medicine | Admitting: Occupational Therapy

## 2020-08-30 ENCOUNTER — Encounter: Payer: Self-pay | Admitting: Occupational Therapy

## 2020-08-30 DIAGNOSIS — R2681 Unsteadiness on feet: Secondary | ICD-10-CM | POA: Insufficient documentation

## 2020-08-30 DIAGNOSIS — R2689 Other abnormalities of gait and mobility: Secondary | ICD-10-CM | POA: Insufficient documentation

## 2020-08-30 DIAGNOSIS — M6281 Muscle weakness (generalized): Secondary | ICD-10-CM | POA: Insufficient documentation

## 2020-08-30 DIAGNOSIS — R293 Abnormal posture: Secondary | ICD-10-CM | POA: Insufficient documentation
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
## 2020-08-31 ENCOUNTER — Other Ambulatory Visit: Payer: Self-pay

## 2020-08-31 ENCOUNTER — Ambulatory Visit: Payer: Medicaid Other

## 2020-08-31 DIAGNOSIS — M6281 Muscle weakness (generalized): Secondary | ICD-10-CM

## 2020-08-31 DIAGNOSIS — R293 Abnormal posture: Secondary | ICD-10-CM

## 2020-08-31 DIAGNOSIS — R2689 Other abnormalities of gait and mobility: Secondary | ICD-10-CM

## 2020-08-31 NOTE — Therapy (Signed)
[facility] [address] [place], [place], [address] Phone: [phone]   Fax:  [phone]  Physical Therapy Treatment  Patient Details  Name: Wesley Fuentes MRN: [id number] Date of Birth: 03-Apr-1976 Referring Provider (PT): Andrena Mews, MD referred by PCP is Lurline Del   Encounter Date: 08/31/2020   PT End of Session - 08/31/20 1404    Visit Number 8    Number of Visits 17   saving 3 visits after 12 weeks with thought OT may use about 8   Authorization Type 3 visits 7/19 to 8/8, 12 visits 8/10-10/4    Authorization - Visit Number 5    Authorization - Number of Visits 12    PT Start Time 1400    PT Stop Time 1448    PT Time Calculation (min) 48 min    Equipment Utilized During Treatment Gait belt   right platform walker   Activity Tolerance Patient tolerated treatment well    Behavior During Therapy WFL for tasks assessed/performed           Past Medical History:  Diagnosis Date  . CHB (complete heart block) ([place])   . Depression 12/11/2017  . Hepatitis C   . History of complete heart block 12/11/2017  . 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 Opioid use disorder, moderate, in early remission ([place]) 12/11/2017    Past Surgical History:  Procedure Laterality Date  . Permanent pacemaker placement     On 11/24/17-had [facility] Jude dual-chamber permanent pacemaker placed.  . Tricuspid valve replacement     On 11/17/17-replacement of tricuspid valve with a 29 mm Medtronic Mosaic mitral prosthesis.    There were no vitals filed for this visit.   Subjective Assessment - 08/31/20 1404    Subjective Pt reports that he is doing well. Had some discomfort when first puts legs on but improves as went on. He did not have to take legs on/off like last time. Walking went well.    Patient is accompained by: Family member    Pertinent History Complete heart block, pacemaker 2018,  Tricuspid valve replacements 2018, Bilateral TTAs & hand amputation 2020. Hep C,    Patient Stated Goals Pt wants to be able to get back to his hobbies and work. Wants to be able to walk by September when going back to [place].                             [facility] Adult PT Treatment/Exercise - 08/31/20 1406      Transfers   Transfers Sit to Stand;Stand to Sit    Sit to Stand 5: Supervision;4: Min guard    Stand to Sit 5: Supervision      Ambulation/Gait   Ambulation/Gait Yes    Ambulation/Gait Assistance 5: Supervision;4: Min guard    Ambulation/Gait Assistance Details Pt reported distal residual limb pain on right during first bout. Had 3 ply sock on. Tried to add 1 ply more but could not get leg in to prosthesis all the way. After taking off and putting back on pt reported leg feeling better. Did have to remove left prosthesis briefly one time reporting feeling like leg being squeezed. HR=74 after    Ambulation Distance (Feet) 230 Feet   115   Assistive device Right platform walker    Gait Pattern Step-through pattern    Ambulation Surface Level;Indoor    Pre-Gait Activities Pt  worked in // bars ambulating forward and backwards with SPC 6' x 8 CGA.    Gait Comments 150' with SPC with quad tip after working in // bars CGA/min assist with verbal cues for sequencing.       Neuro Re-ed    Neuro Re-ed Details  Standing in // bars: staggered stance 30 sec each position CGA, alternating toe taps on 4" step with 1 UE support x 10 then with [facility] for support x 10. Pt took seated rest break between step activities.      Prosthetics   Prosthetic Care Comments  Added 3ply sock to right prosthesis as was sitting low.    Current prosthetic wear tolerance (days/week)  daily    Current prosthetic wear tolerance (#hours/day)  2 hours, 2x/day. Taking at least hour break in between.    Current prosthetic weight-bearing tolerance (hours/day)  was weight bearing 15 min max at a time during session  then sitting briefly.    Residual limb condition  Pt still has small open place right distal tibia. No increased redness or warmth. Looks the same as last time. PT did advise pt not to keep push on area to try to open it back up as would never be able to heal over. Applied tegaderm to use with prosthesis to keep covered.    Education Provided Skin check;Correct ply sock adjustment    Person(s) Educated Patient;Parent(s)    Education Method Explanation    Education Method Verbalized understanding    Donning Prosthesis Supervision    Doffing Prosthesis Supervision                    PT Short Term Goals - 08/01/20 2138      PT SHORT TERM GOAL #1   Title Pt will be independent with donning bilateral prostheses for improved independence.    Baseline supervision with donning.    Time 4    Period Weeks    Status Partially Met    Target Date 09/06/20      PT SHORT TERM GOAL #2   Title Pt will be able to tolerate wearing prosthesis for 8 hours/day.    Baseline currently 4 hours/day    Time 4    Period Weeks    Status On-going    Target Date 09/06/20      PT SHORT TERM GOAL #3   Title Pt will be able to maintain balance in standing x 1 min without UE support for improved balance and standing ADLs    Baseline 1 min 6 sec without UE support CGA    Time 4    Period Weeks    Status Achieved    Target Date 08/06/20      PT SHORT TERM GOAL #4   Title Pt will ambulate 100' with bilateral prosthesis and platform RW CGA for improved mobility in home.    Baseline 115' with bilateral prostheses and right platform walker CGA    Time 4    Period Weeks    Status Achieved    Target Date 08/06/20      PT SHORT TERM GOAL #5   Title Pt will be mod I with sit to stand transfers for improved mobility and functional strength.    Baseline CGA with sit to stand transfers    Time 4    Period Weeks    Status On-going    Target Date 09/06/20  PT Long Term Goals - 07/07/20  2026      PT LONG TERM GOAL #1   Title Pt will be independent with prosthetic care for improved function.    Baseline dependent    Time 12    Period Weeks    Status New    Target Date 10/05/20      PT LONG TERM GOAL #2   Title Pt will report wearing prostheses all awake hours for improved function.    Baseline curently 1 hour 3x/day    Time 12    Period Weeks    Status New    Target Date 10/05/20      PT LONG TERM GOAL #3   Title Pt will be able to perform Berg Balance increasing score from 4/56 to >40/56 for improved balance and decreased fall risk.    Baseline 4/56    Time 12    Period Weeks    Status New    Target Date 10/05/20      PT LONG TERM GOAL #4   Title Pt will ambulate >500' with LRAD mod I for improved short community distances.    Baseline 20' min assist with right platform RW    Time 12    Period Weeks    Status New    Target Date 10/05/20      PT LONG TERM GOAL #5   Title Pt will be able to negotiate up/down curb/ramp with LRAD and stairs with railing mod I for improved community access.    Baseline unable to perform currently    Time 12    Period Weeks    Status New    Target Date 10/05/20                 Plan - 08/31/20 1553    Clinical Impression Statement Pt did well with increasing ambulation in session with platform walker and with cane. Pt did have some discomfort in residual limbs mostly on right but readjusting and putting back on helped. Still having pressure on left at times needing to remove leg once during session to relieve the "squeezing". Improved as went on. Able to try cane with quad tip outside bars for the first time. HR did well during session.    Personal Factors and Comorbidities Comorbidity 3+    Comorbidities Complete heart block, pacemaker 2018, Tricuspid valve replacements 2018, Bilateral TTAs & hand amputation 2020. Hep C,    Examination-Activity Limitations Locomotion Level;Stand;Transfers;Stairs     Examination-Participation Restrictions Community Activity;Yard Work    Stability/Clinical Decision Making Evolving/Moderate complexity    Rehab Potential Good    PT Frequency 1x / week   followed by 2x/week for 4 weeks then 1x/week for 4 weeks   PT Duration 4 weeks    PT Treatment/Interventions ADLs/Self Care Home Management;Gait training;Stair training;Functional mobility training;Therapeutic activities;DME Instruction;Neuromuscular re-education;Balance training;Therapeutic exercise;Patient/family education;Prosthetic Training;Manual techniques;Passive range of motion;Energy conservation    PT Next Visit Plan Check STGs. Stair training, ramp/curb with walker/prostheses, continue gait training with straight cane. Monitor vitals due to extensive cardiac history. Monitor wound on right residual limb.    Consulted and Agree with Plan of Care Patient;Family member/caregiver    Family Member Consulted mother, Gerome Sam           Patient will benefit from skilled therapeutic intervention in order to improve the following deficits and impairments:  Abnormal gait, Cardiopulmonary status limiting activity, Decreased activity tolerance, Decreased balance, Decreased endurance, Decreased mobility, Decreased range of motion,  Difficulty walking, Decreased strength, Decreased knowledge of use of DME, Pain, Prosthetic Dependency  Visit Diagnosis: Other abnormalities of gait and mobility  Muscle weakness (generalized)  Abnormal posture     Problem List Patient Active Problem List   Diagnosis Date Noted  . Wound of left leg 08/24/2020  . Fatigue 07/31/2020  . 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 Educated about COVID-19 virus infection 05/11/2020  . S/P bilateral below knee amputation ([place]) 05/09/2020  . Amputation of right arm ([place]) 05/09/2020  . History of bacterial endocarditis 05/02/2020  . 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 Complete heart block ([place]) 12/11/2017  .  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 Opioid use disorder, moderate, in early remission ([place]) 12/11/2017  . Depression 12/11/2017    Electa Sniff, PT, DPT, NCS 08/31/2020, 3:56 PM  [facility] [address] [place], [place], [address] Phone: [phone]   Fax:  [phone]  Name: Wesley Fuentes MRN: [id number] Date of Birth: 05/02/1976

## 2020-09-05 ENCOUNTER — Ambulatory Visit: Payer: Medicaid Other | Admitting: Physical Therapy

## 2020-09-06 ENCOUNTER — Ambulatory Visit (INDEPENDENT_AMBULATORY_CARE_PROVIDER_SITE_OTHER): Payer: Medicaid Other | Admitting: *Deleted

## 2020-09-06 DIAGNOSIS — I442 Atrioventricular block, complete: Secondary | ICD-10-CM | POA: Diagnosis not present

## 2020-09-06 LAB — CUP PACEART REMOTE DEVICE CHECK
Battery Remaining Longevity: 158 mo
Battery Voltage: 3.16 V
Brady Statistic RV Percent Paced: 99.98 %
Date Time Interrogation Session: 20210908040540
Implantable Lead Implant Date: 20210308
Implantable Lead Location: 753862
Implantable Lead Model: 4968
Implantable Pulse Generator Implant Date: 20210308
Lead Channel Impedance Value: 532 Ohm
Lead Channel Impedance Value: 722 Ohm
Lead Channel Pacing Threshold Amplitude: 1 V
Lead Channel Pacing Threshold Pulse Width: 0.4 ms
Lead Channel Sensing Intrinsic Amplitude: 7 mV
Lead Channel Sensing Intrinsic Amplitude: 8.375 mV
Lead Channel Setting Pacing Amplitude: 2.25 V
Lead Channel Setting Pacing Pulse Width: 0.4 ms
Lead Channel Setting Sensing Sensitivity: 1.2 mV

## 2020-09-07 ENCOUNTER — Ambulatory Visit: Payer: Medicaid Other

## 2020-09-07 ENCOUNTER — Other Ambulatory Visit: Payer: Self-pay

## 2020-09-07 DIAGNOSIS — R293 Abnormal posture: Secondary | ICD-10-CM

## 2020-09-07 DIAGNOSIS — R2689 Other abnormalities of gait and mobility: Secondary | ICD-10-CM

## 2020-09-07 DIAGNOSIS — M6281 Muscle weakness (generalized): Secondary | ICD-10-CM | POA: Diagnosis not present

## 2020-09-07 NOTE — Therapy (Signed)
[facility] [address] [place], [place], [address] Phone: [phone]   Fax:  [phone]  Physical Therapy Treatment  Patient Details  Name: Wesley Fuentes MRN: [id number] Date of Birth: 11-21-1976 Referring Provider (PT): Andrena Mews, MD referred by PCP is Lurline Del   Encounter Date: 09/07/2020   PT End of Session - 09/07/20 1402    Visit Number 9    Number of Visits 17   saving 3 visits after 12 weeks with thought OT may use about 8   Authorization Type 3 visits 7/19 to 8/8, 12 visits 8/10-10/4    Authorization - Visit Number 6    Authorization - Number of Visits 12    PT Start Time 1400    PT Stop Time 1445    PT Time Calculation (min) 45 min    Equipment Utilized During Treatment Gait belt   right platform walker   Activity Tolerance Patient tolerated treatment well    Behavior During Therapy WFL for tasks assessed/performed           Past Medical History:  Diagnosis Date  . CHB (complete heart block) ([place])   . Depression 12/11/2017  . Hepatitis C   . History of complete heart block 12/11/2017  . 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 Opioid use disorder, moderate, in early remission ([place]) 12/11/2017    Past Surgical History:  Procedure Laterality Date  . Permanent pacemaker placement     On 11/24/17-had [facility] Jude dual-chamber permanent pacemaker placed.  . Tricuspid valve replacement     On 11/17/17-replacement of tricuspid valve with a 29 mm Medtronic Mosaic mitral prosthesis.    There were no vitals filed for this visit.   Subjective Assessment - 09/07/20 1401    Subjective Pt reports that he stayed off leg on Tuesday as wound opened back up. Has closed up now. Did put some antiobiotic cream on it prior to coming with tegaderm.    Patient is accompained by: Family member    Pertinent History Complete heart block, pacemaker 2018, Tricuspid valve  replacements 2018, Bilateral TTAs & hand amputation 2020. Hep C,    Patient Stated Goals Pt wants to be able to get back to his hobbies and work. Wants to be able to walk by September when going back to [place].    Currently in Pain? No/denies                             [facility] Adult PT Treatment/Exercise - 09/07/20 1403      Transfers   Transfers Sit to Stand;Stand to Sit    Sit to Stand 5: Supervision    Stand to Sit 5: Supervision      Ambulation/Gait   Ambulation/Gait Yes    Ambulation/Gait Assistance 5: Supervision;4: Min guard;4: Min assist    Ambulation/Gait Assistance Details With RW pt is supervision with reciprocal pattern. Was cued to try to stand more erect as leaning some. Pt did report some discomfort in right residual limb initially. As went on improved. Pt did have to take left prosthesis off 1 time as feeling like too much pressure/cramping. Improved after. With [facility] pt min assist/CGA with cues to try to increase step length some. Pt was moving cane every other step at times. HR=76, O2=92    Ambulation Distance (Feet) 230 Feet   50' x 1 and 115' x 1 with SPC with  quad tip   Assistive device Straight cane   with quad tip   Gait Pattern Step-through pattern;Decreased step length - right;Decreased step length - left    Ambulation Surface Level;Indoor    Ramp 4: Min assist    Ramp Details (indicate cue type and reason) x 2 with platform walker with verbal cues to keep weight forward on toes going up and back on heels going down.    Curb 4: Min assist    Curb Details (indicate cue type and reason) with platform RW with verbal cues for sequencing. Pt using all arms to come down to clear as front of foot not off step prior to coming down.      Neuro Re-ed    Neuro Re-ed Details  At counter: standing hip abd x 10 each leg with verbal and tactile cues for form and to tighten glut on stance leg and not lean, hamstring curl x 10 each leg, side stepping 6' x 4 with 1 UE  support, alternating toe taps on cone x 10 with 1 UE support CGA.      Prosthetics   Prosthetic Care Comments  Pt needed min assist to donn prosthesis today as could not get pin to lock in. Reports he has times when this occurs. Discussed taking a break to prevent frustration when occurs and then come back to it. Need to make sure pin lined up correctly. Also discussed wearing shrinker still at night especially on LLE.    Current prosthetic wear tolerance (days/week)  daily    Current prosthetic wear tolerance (#hours/day)  3 hours, 2x/day    Residual limb condition  Moist around closed wound as pt had put the antibiotic ointment on it on right distal tibia. No redness around.    Education Provided Skin check;Correct ply sock adjustment;Proper Donning;Proper Doffing;Proper wear schedule/adjustment    Person(s) Educated Patient    Education Method Explanation    Education Method Verbalized understanding    Donning Prosthesis Minimal assist    Doffing Prosthesis Modified independent (device/increased time)                  PT Education - 09/07/20 1549    Education Details Discussed adding in the cup taps at sink with 1 UE support and mom supervising for safety. To use plastic cup.    Person(s) Educated Patient;Parent(s)    Methods Explanation;Demonstration    Comprehension Verbalized understanding;Returned demonstration            PT Short Term Goals - 09/07/20 1550      PT SHORT TERM GOAL #1   Title Pt will be independent with donning bilateral prostheses for improved independence.    Baseline Pt's ability does vary as at times has trouble getting pin to lock in. He is able to apply liner and usually leg mod I but at times needs min assist.    Time 4    Period Weeks    Status Partially Met    Target Date 09/06/20      PT SHORT TERM GOAL #2   Title Pt will be able to tolerate wearing prosthesis for 8 hours/day.    Baseline currently up to 6 hours/day. Progress has been  slowed due to small wound on right residual limb 09/07/20    Time 4    Period Weeks    Status On-going    Target Date 09/06/20      PT SHORT TERM GOAL #3   Title Pt will be  able to maintain balance in standing x 1 min without UE support for improved balance and standing ADLs    Baseline 1 min 6 sec without UE support CGA    Time 4    Period Weeks    Status Achieved    Target Date 08/06/20      PT SHORT TERM GOAL #4   Title Pt will ambulate 100' with bilateral prosthesis and platform RW CGA for improved mobility in home.    Baseline 115' with bilateral prostheses and right platform walker CGA    Time 4    Period Weeks    Status Achieved    Target Date 08/06/20      PT SHORT TERM GOAL #5   Title Pt will be mod I with sit to stand transfers for improved mobility and functional strength.    Baseline supervision with sit to stand transfers and platform walker on 09/07/20    Time 4    Period Weeks    Status On-going    Target Date 09/06/20             PT Long Term Goals - 07/07/20 2026      PT LONG TERM GOAL #1   Title Pt will be independent with prosthetic care for improved function.    Baseline dependent    Time 12    Period Weeks    Status New    Target Date 10/05/20      PT LONG TERM GOAL #2   Title Pt will report wearing prostheses all awake hours for improved function.    Baseline curently 1 hour 3x/day    Time 12    Period Weeks    Status New    Target Date 10/05/20      PT LONG TERM GOAL #3   Title Pt will be able to perform Berg Balance increasing score from 4/56 to >40/56 for improved balance and decreased fall risk.    Baseline 4/56    Time 12    Period Weeks    Status New    Target Date 10/05/20      PT LONG TERM GOAL #4   Title Pt will ambulate >500' with LRAD mod I for improved short community distances.    Baseline 20' min assist with right platform RW    Time 12    Period Weeks    Status New    Target Date 10/05/20      PT LONG TERM GOAL #5     Title Pt will be able to negotiate up/down curb/ramp with LRAD and stairs with railing mod I for improved community access.    Baseline unable to perform currently    Time 12    Period Weeks    Status New    Target Date 10/05/20                 Plan - 09/07/20 1552    Clinical Impression Statement Pt continues to show progress with gait stability working more with cane. Started ramp and curb training with platform walker. Will need continued practice for correct prosthetic foot placement.    Personal Factors and Comorbidities Comorbidity 3+    Comorbidities Complete heart block, pacemaker 2018, Tricuspid valve replacements 2018, Bilateral TTAs & hand amputation 2020. Hep C,    Examination-Activity Limitations Locomotion Level;Stand;Transfers;Stairs    Examination-Participation Restrictions Community Activity;Yard Work    Stability/Clinical Decision Making Evolving/Moderate complexity    Rehab Potential Good    PT  Frequency 1x / week   followed by 2x/week for 4 weeks then 1x/week for 4 weeks   PT Duration 4 weeks    PT Treatment/Interventions ADLs/Self Care Home Management;Gait training;Stair training;Functional mobility training;Therapeutic activities;DME Instruction;Neuromuscular re-education;Balance training;Therapeutic exercise;Patient/family education;Prosthetic Training;Manual techniques;Passive range of motion;Energy conservation    PT Next Visit Plan Issue updated HEP for counter exercises. Stair training, ramp/curb with walker/prostheses, continue gait training with straight cane. Monitor vitals due to extensive cardiac history. Monitor wound on right residual limb.    Consulted and Agree with Plan of Care Patient;Family member/caregiver    Family Member Consulted mother, Gerome Sam           Patient will benefit from skilled therapeutic intervention in order to improve the following deficits and impairments:  Abnormal gait, Cardiopulmonary status limiting  activity, Decreased activity tolerance, Decreased balance, Decreased endurance, Decreased mobility, Decreased range of motion, Difficulty walking, Decreased strength, Decreased knowledge of use of DME, Pain, Prosthetic Dependency  Visit Diagnosis: Other abnormalities of gait and mobility  Muscle weakness (generalized)  Abnormal posture     Problem List Patient Active Problem List   Diagnosis Date Noted  . Wound of left leg 08/24/2020  . Fatigue 07/31/2020  . 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 Educated about COVID-19 virus infection 05/11/2020  . S/P bilateral below knee amputation ([place]) 05/09/2020  . Amputation of right arm ([place]) 05/09/2020  . History of bacterial endocarditis 05/02/2020  . 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 Complete heart block ([place]) 12/11/2017  . 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 Opioid use disorder, moderate, in early remission ([place]) 12/11/2017  . Depression 12/11/2017    Electa Sniff, PT, DPT, NCS 09/07/2020, 3:55 PM  [facility] [address] [place], [place], [address] Phone: [phone]   Fax:  [phone]  Name: Wesley Fuentes MRN: [id number] Date of Birth: 17-Nov-1976

## 2020-09-07 NOTE — Progress Notes (Signed)
Remote pacemaker transmission.   

## 2020-09-09 DIAGNOSIS — Z950 Presence of cardiac pacemaker: Secondary | ICD-10-CM | POA: Insufficient documentation

## 2020-09-11 ENCOUNTER — Encounter: Payer: Medicaid Other | Admitting: Internal Medicine

## 2020-09-12 ENCOUNTER — Telehealth: Payer: Self-pay | Admitting: Family Medicine

## 2020-09-12 ENCOUNTER — Telehealth: Payer: Self-pay

## 2020-09-12 NOTE — Telephone Encounter (Signed)
Returned patient's call, documented in telephone note.

## 2020-09-12 NOTE — Telephone Encounter (Signed)
Called patient and patient's mother regarding nurse note earlier that patient had an area of pus draining from his lower limb.  Patient's mother states the patient noticed this while sitting in the shower and it seems to be in an area where his prosthetic rubs against his lower limb.  Patient mother states that this lesion is small, around the size of the tip of the persons fifth digit and after the patient was showering it did come to a head and began draining a small amount of pus.  She thinks that this has been present on and off for a few weeks but is not sure.  Patient has been using an antibiotic cream and plans to follow-up with [facility] tomorrow as scheduled for continued modification of his prosthetic for fitting.  She denies patient having systemic symptoms including fever, chills, pain extending from the area, redness extending from the area and says this seems to be isolated to one little lesion on the edge of his lower limb.  I discussed with her that should he develop any signs of fever, chills, pain or increasing pain, redness radiating from the area, or warmth coming from the lower limb that he should be evaluated.  Patient mother states that they are plan on going out of town for 1 week in the next day or 2.  I discussed with her that should any of these things occur while she is out of town that I strongly recommend she go to the emergency department or an urgent care for further evaluation.  I discussed with her that our clinic policy is to not prescribe antibiotics without seeing the patient and she expressed her understanding.  I explained to the patient's mother that I would like for her to make a follow-up appointment with me for the patient once they return back to town for further evaluation, particularly if it is not healed at that time.  Return precautions provided.

## 2020-09-12 NOTE — Telephone Encounter (Signed)
Mother calls nurse line reporting increased drainage in leg wound. Mother repots they were seen on 8/26 and given Bactroban to apply onto the area BID. Mother reports this has been working "ok" its just not completely going away and every 24 hours the area is full of puss again. The area is draining on its own, no odor noted, no fever or chills reported by patient. Mother is concerned because they are leaving for out of town on Thursday and dose not want to have any problems while driving "through the night." Patient already has apts tomorrow at [facility] and our clinic schedule is full. Mother is requesting an oral antibiotic. Please advise.

## 2020-09-26 ENCOUNTER — Ambulatory Visit: Payer: Medicaid Other | Admitting: Physical Therapy

## 2020-09-29 ENCOUNTER — Telehealth: Payer: Self-pay

## 2020-09-29 ENCOUNTER — Ambulatory Visit: Payer: Medicaid Other | Attending: Family Medicine

## 2020-09-29 ENCOUNTER — Other Ambulatory Visit: Payer: Self-pay

## 2020-09-29 VITALS — BP 118/78 | HR 72

## 2020-09-29 DIAGNOSIS — M25631 Stiffness of right wrist, not elsewhere classified: Secondary | ICD-10-CM | POA: Diagnosis present

## 2020-09-29 DIAGNOSIS — R2689 Other abnormalities of gait and mobility: Secondary | ICD-10-CM | POA: Insufficient documentation

## 2020-09-29 DIAGNOSIS — R293 Abnormal posture: Secondary | ICD-10-CM | POA: Diagnosis present

## 2020-09-29 DIAGNOSIS — R2681 Unsteadiness on feet: Secondary | ICD-10-CM | POA: Diagnosis present

## 2020-09-29 DIAGNOSIS — M6281 Muscle weakness (generalized): Secondary | ICD-10-CM | POA: Insufficient documentation

## 2020-09-29 NOTE — Telephone Encounter (Signed)
Dr. Atha Starks, I saw Wesley Fuentes today for PT. The wound on his right distal tibia was closed but he and his mother report it keeps opening up every couple days with yellow pus drainage. She showed me a picture that confirms this. I did not notet any redness, warmth, or tenderness today but am concerned as it keeps opening back up with drainage. I wanted to see if it would be possible to get him a consult with Dr. Aldean Baker as he specializes with amputee care. Not sure if he would need a referral from PCP for this. Thanks so much for your help! [name], PT, DPT, NCS

## 2020-09-29 NOTE — Therapy (Signed)
[facility] [address] [place], [place], [address] Phone: [phone]   Fax:  [phone]  Physical Therapy Treatment/Recert  Patient Details  Name: Wesley Fuentes MRN: [id number] Date of Birth: 06/11/1976 Referring Provider (PT): Janit Pagan, MD referred by PCP is [name]   Encounter Date: 09/29/2020   PT End of Session - 09/29/20 1500    Visit Number 10    Number of Visits 22   saving 3 visits after 12 weeks with thought OT may use about 8   Authorization Type 3 visits 7/19 to 8/8, 12 visits 8/10-10/4    Authorization - Visit Number 7    Authorization - Number of Visits 12    PT Start Time 1450    PT Stop Time 1532    PT Time Calculation (min) 42 min    Equipment Utilized During Treatment Gait belt   right platform walker   Activity Tolerance Patient tolerated treatment well    Behavior During Therapy WFL for tasks assessed/performed           Past Medical History:  Diagnosis Date  . CHB (complete heart block) (HCC)   . Depression 12/11/2017  . Hepatitis C   . History of complete heart block 12/11/2017  . 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 Opioid use disorder, moderate, in early remission (HCC) 12/11/2017    Past Surgical History:  Procedure Laterality Date  . Permanent pacemaker placement     On 11/24/17-had [facility] Jude dual-chamber permanent pacemaker placed.  . Tricuspid valve replacement     On 11/17/17-replacement of tricuspid valve with a 29 mm Medtronic Mosaic mitral prosthesis.    Vitals:   09/29/20 1506  BP: 118/78  Pulse: 72  SpO2: 94%     Subjective Assessment - 09/29/20 1500    Subjective Pt reports that he is doing well. He went to family reunion and used cane while there. Walked in to clinic with cane. Pt reports that he wore socks on right leg while gone and on and off at home. Sitll having issue with the small wound on right  distal tibia opening up and draining yellow pus every couple days. Currently closed with no redness, warmth, tenderness.    Patient is accompained by: Family member    Pertinent History Complete heart block, pacemaker 2018, Tricuspid valve replacements 2018, Bilateral TTAs & hand amputation 2020. Hep C,    Patient Stated Goals Pt wants to be able to get back to his hobbies and work. Wants to be able to walk by September when going back to [place].    Currently in Pain? No/denies                             [facility] Adult PT Treatment/Exercise - 09/29/20 1503      Ambulation/Gait   Ambulation/Gait Yes    Ambulation/Gait Assistance 5: Supervision;4: Min guard    Ambulation/Gait Assistance Details Pt was cued to stand tall and increase step length. Pt able to walk some without cane as well. With cane was instructed to keep out to the side.    Ambulation Distance (Feet) 345 Feet    Assistive device Straight cane;Prostheses    Gait Pattern Step-through pattern    Ambulation Surface Level;Indoor    Gait velocity 14.31 sec=0.64m/s    Stairs Yes    Stairs Assistance 5: Supervision;4: Min guard    [employer]  Details (indicate cue type and reason) Verbal cues to place foot that is lowering with forefoot off step to allow him to rock forward with prosthesis.    Stair Management Technique One rail Right;Step to pattern    Number of Stairs 8    Ramp 4: Min assist   2nd person for safety.   Ramp Details (indicate cue type and reason) Pt utilized cane with verbal cues to keep weight forward going up and back on heels with descent.    Curb 4: Min assist   +2 for safety   Curb Details (indicate cue type and reason) with cane and verbal cues for proper technique to get forefoot off step prior to stepping down.    Gait Comments O2=94 and HR=80 after gait.      Standardized Balance Assessment   Standardized Balance Assessment Berg Balance Test      Berg Balance Test   Sit to Stand Able  to stand  independently using hands    Standing Unsupported Able to stand safely 2 minutes    Sitting with Back Unsupported but Feet Supported on Floor or Stool Able to sit safely and securely 2 minutes    Stand to Sit Sits safely with minimal use of hands    Transfers Able to transfer safely, definite need of hands    Standing Unsupported with Eyes Closed Able to stand 10 seconds with supervision    Standing Ubsupported with Feet Together Able to place feet together independently and stand for 1 minute with supervision    From Standing, Reach Forward with Outstretched Arm Can reach forward >12 cm safely (5")    From Standing Position, Pick up Object from Floor Able to pick up shoe, needs supervision    From Standing Position, Turn to Look Behind Over each Shoulder Turn sideways only but maintains balance    Turn 360 Degrees Needs close supervision or verbal cueing    Standing Unsupported, Alternately Place Feet on Step/Stool Able to complete >2 steps/needs minimal assist    Standing Unsupported, One Foot in Front Able to take small step independently and hold 30 seconds    Standing on One Leg Tries to lift leg/unable to hold 3 seconds but remains standing independently    Total Score 37      Prosthetics   Prosthetic Care Comments  Pt was able to donn prostheses supervision today. He was not wearing any socks. Reports has been wearing 3ply at times on right but still not sure exactly when needs them other than if feeling looser.    Current prosthetic wear tolerance (days/week)  daily    Current prosthetic wear tolerance (#hours/day)  6 hours/day in 2-3 hour increments    Residual limb condition  small wound at right distal tibia closed with no redness, warmth, swelling around. Pt reports still opening up every couple days with pus drainage and pt's mother showed PT a picture on camera.    Education Provided Skin check;Correct ply sock adjustment;Proper Donning;Proper Doffing;Proper wear  schedule/adjustment    Person(s) Educated Patient;Parent(s)    Education Method Explanation    Education Method Verbalized understanding    Donning Prosthesis Independent    Doffing Prosthesis Independent                  PT Education - 09/29/20 1900    Education Details Discussed plan to reach out to MD about wound that keeps opening. Suggested getting pt in with Dr. Aldean Baker who is amputation specialist.  Discussed recert plan.    Person(s) Educated Patient;Parent(s)    Methods Explanation    Comprehension Verbalized understanding            PT Short Term Goals - 09/29/20 1518      PT SHORT TERM GOAL #1   Title Pt will be independent with donning bilateral prostheses for improved independence.    Baseline Pt was independent with doning/doffing prosthesis 09/29/20    Time 4    Period Weeks    Status Achieved    Target Date 09/06/20      PT SHORT TERM GOAL #2   Title Pt will be able to tolerate wearing prosthesis for 8 hours/day.    Baseline currently up to 6 hours/day. Progress has been slowed due to small wound on right residual limb 09/07/20    Time 4    Period Weeks    Status On-going    Target Date 09/06/20      PT SHORT TERM GOAL #3   Title Pt will be able to maintain balance in standing x 1 min without UE support for improved balance and standing ADLs    Baseline 1 min 6 sec without UE support CGA    Time 4    Period Weeks    Status Achieved    Target Date 08/06/20      PT SHORT TERM GOAL #4   Title Pt will ambulate 100' with bilateral prosthesis and platform RW CGA for improved mobility in home.    Baseline 115' with bilateral prostheses and right platform walker CGA    Time 4    Period Weeks    Status Achieved    Target Date 08/06/20      PT SHORT TERM GOAL #5   Title Pt will be mod I with sit to stand transfers for improved mobility and functional strength.    Baseline supervision with sit to stand transfers and platform walker on 09/07/20. Mod I  with sit to stand transfers on 09/29/20    Time 4    Period Weeks    Status Achieved    Target Date 09/06/20             PT Long Term Goals - 09/29/20 1519      PT LONG TERM GOAL #1   Title Pt will be independent with prosthetic care for improved function.    Baseline Pt is independent with donning/doffing prostheses. He still needs help at times with determining proper sock ply adjustment.    Time 12    Period Weeks    Status On-going      PT LONG TERM GOAL #2   Title Pt will report wearing prostheses all awake hours for improved function.    Baseline 6 hours/day    Time 12    Period Weeks    Status On-going      PT LONG TERM GOAL #3   Title Pt will be able to perform Berg Balance increasing score from 4/56 to >40/56 for improved balance and decreased fall risk.    Baseline 4/56. 09/29/20 37/56 on Sharlene Motts    Time 12    Period Weeks    Status On-going      PT LONG TERM GOAL #4   Title Pt will ambulate >500' with LRAD mod I for improved short community distances.    Baseline 345' with cane supervision/CGA    Time 12    Period Weeks    Status On-going  PT LONG TERM GOAL #5   Title Pt will be able to negotiate up/down curb/ramp with LRAD and stairs with railing mod I for improved community access.    Baseline CGA with stair negotiation with 1 rail, min assist on ramp, min assist +2 on curb with SPC    Time 12    Period Weeks    Status On-going          Updated PT goals:  PT Short Term Goals - 09/29/20 1917      PT SHORT TERM GOAL #1   Title Pt will be independent with prosthetic care and management to manage prosthesis on own.    Baseline currently needs assist to determine proper sock ply    Time 6    Period Weeks    Status New    Target Date 11/10/20      PT SHORT TERM GOAL #2   Title Pt will be able to tolerate wearing prosthesis for 8 hours/day.    Baseline currently up to 6 hours/day. Progress has been slowed due to small wound on right residual limb  09/07/20    Time 6    Period Weeks    Status On-going    Target Date 11/10/20      PT SHORT TERM GOAL #3   Title Pt will increase Berg form 37/56 to >41/56 for improved balance and decreased fall risk.    Baseline 37/56 on 09/29/20    Time 6    Period Weeks    Status New    Target Date 11/10/20      PT SHORT TERM GOAL #4   Title Pt will ambulate >500' with cane on varied surfaces mod I for improved mobility in community.    Baseline 345' with cane supervision/CGA on level surfaces    Time 6    Period Weeks    Status New    Target Date 11/10/20           PT Long Term Goals - 09/29/20 1922      PT LONG TERM GOAL #1   Title Pt will tolerate wearing prostheses all awake hours with no skin issues for improved function.    Baseline 6 hours/day currently with reoccurring wound on right distal tibia.    Time 12    Period Weeks    Status Revised    Target Date 12/22/20      PT LONG TERM GOAL #2   Title Pt will increase gait speed from 0.85m/s to >0.67m/s for improved gait safety in community.    Baseline 09/29/20 0.79m/s    Time 12    Period Weeks    Status New    Target Date 12/22/20      PT LONG TERM GOAL #3   Title Pt will be able to perform Berg Balance increasing score from 37/56 to >46/56 for improved balance and decreased fall risk.    Baseline 09/29/20 37/56 on Berg    Time 12    Period Weeks    Status Revised    Target Date 12/22/20      PT LONG TERM GOAL #4   Title Pt will ambulate >800' on varied surfaces without AD for improved community mobility independently.    Baseline 345' with cane supervision/CGA    Time 12    Period Weeks    Status New    Target Date 12/22/20      PT LONG TERM GOAL #5   Title Pt will be  able to negotiate up/down curb/ramp with LRAD and stairs with railing mod I for improved community access.    Baseline CGA with stair negotiation with 1 rail, min assist on ramp, min assist +2 on curb with SPC    Time 12    Period Weeks    Status  On-going    Target Date 12/22/20                 Plan - 09/29/20 1909    Clinical Impression Statement PT reassessed goals today. Pt showing good progress towards all goals. He is now ambulating with cane supervision/CGA 345' with improving gait speed. Gait speed of 0.70m/s indicates safe household ambulator but decreased safety for community ambulator. Pt just started ramp/curb training with cane and is min assist +2 for safety currently. Pt increased Berg from 4/56 to 37/56 showing improving balance. He is still high fall risk, however. Pt showing improving functional strength with performing transfers mod I at this time. Pt has been limited some by small wound that keeps reoccurring at right distal tibia. Closed at visit today. Pt continues to benefit from skilled PT to further progress his strength, gait and balance.    Personal Factors and Comorbidities Comorbidity 3+    Comorbidities Complete heart block, pacemaker 2018, Tricuspid valve replacements 2018, Bilateral TTAs & hand amputation 2020. Hep C,    Examination-Activity Limitations Locomotion Level;Stand;Transfers;Stairs    Examination-Participation Restrictions Community Activity;Yard Work    Conservation officer, historic buildings Evolving/Moderate complexity    Rehab Potential Good    PT Frequency 1x / week    PT Duration 12 weeks    PT Treatment/Interventions ADLs/Self Care Home Management;Gait training;Stair training;Functional mobility training;Therapeutic activities;DME Instruction;Neuromuscular re-education;Balance training;Therapeutic exercise;Patient/family education;Prosthetic Training;Manual techniques;Passive range of motion;Energy conservation    PT Next Visit Plan Issue updated HEP for counter exercises. Stair training, ramp/curb with cane/prostheses, continue gait training with straight cane. Monitor vitals due to extensive cardiac history. Monitor wound on right residual limb. I have faxed MD about reoccuring wound on  limb. Was closed at my visit today but pt reports keeps opening. Trying to get him in with Dr. Lajoyce Corners for consult.    Consulted and Agree with Plan of Care Patient;Family member/caregiver    Family Member Consulted mother, Abigail Butts           Patient will benefit from skilled therapeutic intervention in order to improve the following deficits and impairments:  Abnormal gait, Cardiopulmonary status limiting activity, Decreased activity tolerance, Decreased balance, Decreased endurance, Decreased mobility, Decreased range of motion, Difficulty walking, Decreased strength, Decreased knowledge of use of DME, Pain, Prosthetic Dependency  Visit Diagnosis: Other abnormalities of gait and mobility  Muscle weakness (generalized)  Abnormal posture  Unsteadiness on feet     Problem List Patient Active Problem List   Diagnosis Date Noted  . Pacemaker - MDT 09/09/2020  . Wound of left leg 08/24/2020  . Fatigue 07/31/2020  . 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 Educated about COVID-19 virus infection 05/11/2020  . S/P bilateral below knee amputation (HCC) 05/09/2020  . 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 History of bacterial endocarditis 05/02/2020  . 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 Complete heart block (HCC) 12/11/2017  . 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 Opioid use disorder, moderate, in early remission (HCC) 12/11/2017  . Depression 12/11/2017    Ronn Melena, PT, DPT, NCS 09/29/2020, 7:16 PM  [facility]  [address] [place], [place], [address] Phone: [phone]   Fax:  [phone]  Name: Lilia Proicholas S Ringley MRN: [id number] Date of Birth: 1976/03/24

## 2020-10-02 ENCOUNTER — Other Ambulatory Visit: Payer: Self-pay | Admitting: Family Medicine

## 2020-10-02 DIAGNOSIS — Z89512 Acquired absence of left leg below knee: Secondary | ICD-10-CM

## 2020-10-02 DIAGNOSIS — Z89511 Acquired absence of right leg below knee: Secondary | ICD-10-CM

## 2020-10-02 NOTE — Telephone Encounter (Signed)
Referral sent per request

## 2020-10-03 ENCOUNTER — Ambulatory Visit: Payer: Medicaid Other | Admitting: Occupational Therapy

## 2020-10-03 ENCOUNTER — Other Ambulatory Visit: Payer: Self-pay | Admitting: Family Medicine

## 2020-10-03 ENCOUNTER — Other Ambulatory Visit: Payer: Self-pay

## 2020-10-03 ENCOUNTER — Encounter: Payer: Self-pay | Admitting: Occupational Therapy

## 2020-10-03 DIAGNOSIS — M6281 Muscle weakness (generalized): Secondary | ICD-10-CM

## 2020-10-03 DIAGNOSIS — M25631 Stiffness of right wrist, not elsewhere classified: Secondary | ICD-10-CM

## 2020-10-03 DIAGNOSIS — R2681 Unsteadiness on feet: Secondary | ICD-10-CM

## 2020-10-03 DIAGNOSIS — R293 Abnormal posture: Secondary | ICD-10-CM

## 2020-10-03 NOTE — Telephone Encounter (Signed)
Thanks so much. 

## 2020-10-03 NOTE — Therapy (Signed)
[facility] [address] [place], [place], [address] Phone: [phone]   Fax:  [phone]  Occupational Therapy Treatment  Patient Details  Name: Wesley Fuentes MRN: [id number] Date of Birth: Dec 24, 1976 Referring Provider (OT): Gaye Alken will send records to PCP [name]   Encounter Date: 10/03/2020    Past Medical History:  Diagnosis Date  . CHB (complete heart block) (HCC)   . Depression 12/11/2017  . Hepatitis C   . History of complete heart block 12/11/2017  . 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 Opioid use disorder, moderate, in early remission (HCC) 12/11/2017    Past Surgical History:  Procedure Laterality Date  . Permanent pacemaker placement     On 11/24/17-had [facility] Jude dual-chamber permanent pacemaker placed.  . Tricuspid valve replacement     On 11/17/17-replacement of tricuspid valve with a 29 mm Medtronic Mosaic mitral prosthesis.    There were no vitals filed for this visit.   Subjective Assessment - 10/03/20 1604    Subjective  Patient did not have liner to assist with donning UE prosthesis.  Rescheduled appt for Thursday    Patient is accompanied by: Family member    Currently in Pain? No/denies    Pain Score 0-No pain                                  OT Short Term Goals - 08/30/20 1726      OT SHORT TERM GOAL #1   Title Patient will demonstrate ability to walk into bathroom with supervision assist as needed for toileting/ showering due 11/5    Baseline Patient requires caregier to push him into bathroom, while he isseated on rollator,  as w/c does not fit    Time 4    Period Weeks    Status New    Target Date 11/03/20      OT SHORT TERM GOAL #2   Title Patient will effectively don UE prosthesis, adjust position for task, and change out end fittings as warranted functionally    Baseline Patient can don  prosthesis, but cannot change orientation of hook without assistance    Time 4    Period Weeks    Status New      OT SHORT TERM GOAL #3   Title Patient will pick up, transport 3 feet,  and release lightweight objects while seated at table    Baseline Needs cueing and occasional assistance for orientation of hook, and shoulder    Time 4    Period Weeks    Status New             OT Long Term Goals - 08/30/20 1731      OT LONG TERM GOAL #1   Title Patient will complete an upper body home exercise program    Baseline No current HEP    Time 8    Period Weeks    Status New    Target Date 12/03/20      OT LONG TERM GOAL #2   Title Patient will walk and carry grocery bag in RUE across 10 foot level surface    Baseline Cannot walk and carry items in right UE    Time 8    Period Weeks    Status New      OT LONG TERM GOAL #3   Title Patient will don long  pants/jeans (not elastic waist) with prosthesis    Baseline Has not put on long pants with prosthesis    Time 6    Period Weeks    Status New      OT LONG TERM GOAL #4   Title Patient will walk into bathroom for toileting needs with modified independence    Baseline Min assist for transfer to rollator, then dependent for transfer across room  to toilet    Time 6    Period Weeks    Status New                  Patient will benefit from skilled therapeutic intervention in order to improve the following deficits and impairments:           Visit Diagnosis: Muscle weakness (generalized)  Abnormal posture  Unsteadiness on feet  Stiffness of joint of right forearm    Problem List Patient Active Problem List   Diagnosis Date Noted  . Pacemaker - MDT 09/09/2020  . Wound of left leg 08/24/2020  . Fatigue 07/31/2020  . 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 Educated about COVID-19 virus infection 05/11/2020  . S/P bilateral below knee amputation (HCC) 05/09/2020  . Amputation of right arm  (HCC) 05/09/2020  . History of bacterial endocarditis 05/02/2020  . 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 Complete heart block (HCC) 12/11/2017  . 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 Opioid use disorder, moderate, in early remission (HCC) 12/11/2017  . Depression 12/11/2017   Patient arrived today for scheduled appointment but did not have liner to help apply UE prosthetic - became uncomfortable trying to don.  Cancelled session and rescheduled for later this week.  Collier Salina 10/03/2020, 4:12 PM  [facility] [address] [place], [place], [address] Phone: [phone]   Fax:  [phone]  Name: Wesley Fuentes MRN: [id number] Date of Birth: Jul 04, 1976

## 2020-10-04 ENCOUNTER — Ambulatory Visit: Payer: Medicaid Other

## 2020-10-04 DIAGNOSIS — R2681 Unsteadiness on feet: Secondary | ICD-10-CM

## 2020-10-04 DIAGNOSIS — R2689 Other abnormalities of gait and mobility: Secondary | ICD-10-CM

## 2020-10-04 DIAGNOSIS — M6281 Muscle weakness (generalized): Secondary | ICD-10-CM

## 2020-10-04 NOTE — Therapy (Addendum)
[facility] [address] [place], [place], [address] Phone: [phone]   Fax:  [phone]  Physical Therapy Treatment  Patient Details  Name: Wesley Fuentes MRN: [id number] Date of Birth: [birth year] Referring Provider (PT): Janit PaganKehinde Eniola, MD referred by PCP is Wesley Fuentes   Encounter Date: 10/04/2020   PT End of Session - 10/04/20 1611    Visit Number 11    Number of Visits 22   saving 3 visits after 12 weeks with thought OT may use about 8   Authorization Type 3 visits 7/19 to 8/8, 12 visits 8/10-10/4, 11 visit 10/6-12/28    Authorization - Visit Number 1    Authorization - Number of Visits 11    PT Start Time 1445    PT Stop Time 1530    PT Time Calculation (min) 45 min    Equipment Utilized During Treatment Gait belt;Other (comment)   Protheses   Activity Tolerance Patient tolerated treatment well;No increased pain    Behavior During Therapy WFL for tasks assessed/performed           Past Medical History:  Diagnosis Date   CHB (complete heart block) (HCC)    Depression 12/11/2017   Hepatitis C    History of complete heart block 12/11/2017   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   Tricuspid valve replacement     On 11/17/17-replacement of tricuspid valve with a 29 mm Medtronic Mosaic mitral prosthesis.    There were no vitals filed for this visit.   Subjective Assessment - 10/04/20 1459    Subjective Pt reports that he is doing well. Sitll having issue with the small wound on right distal tibia opening up and draining yellow pus every couple days. PT and mother of pt helped clean exudate and gel lining after blister released fluid  on the way here. Pt has appointment to see Dr. Lajoyce Cornersuda tomorrow.    Patient is accompained by: Family member    Pertinent History Complete heart block, pacemaker 2018, Tricuspid valve replacements 2018, Bilateral TTAs & hand amputation 2020. Hep C,    Limitations Standing;Walking;House hold activities    Patient Stated Goals Pt wants to be able to get back to his hobbies and work. Wants to be able to walk by September when going back to [place].    Currently in Pain? Yes    Pain Score 1     Pain Location Other (Comment)   residual limb blister BKA   Pain Orientation Right                             [facility] Adult PT Treatment/Exercise - 10/04/20 1501      Transfers   Transfers Sit to Stand;Stand to Sit    Sit to Stand 5: Supervision    Stand to Sit 5: Supervision      Ambulation/Gait   Ambulation/Gait Yes    Ambulation/Gait Assistance 5: Supervision;4: Min guard    Ambulation/Gait Assistance Details Pt was cued to stand tall and increase step length.     Ambulation Distance (Feet) 230 Feet    Assistive device Straight cane;Prosthesis   quad tip   Gait Pattern Step-through pattern;Decreased weight shift  to left    Ambulation Surface Level;Indoor    Ramp 4: Min assist    Ramp Details (indicate cue type and reason) x6 with min A using SPC with quad tip. Pt required more A going up. Pt with 1 mild LOB going up, requiring min A to correct. Pt reports wanting to practice ramp d/t having ramp at home and having to take his puppy out.  Pt was cued to keep weight on front of foot going up and back of foot going down.    Curb --    Curb Details (indicate cue type and reason) --    Gait Comments Pt ambulated 230' using SPC with quad tip using min A with cue to weight shift onto LLE and increase step lenght with RLE.      Neuro Re-ed    Neuro Re-ed Details  Pt instructed in hurdle navigation with reciprocal stepping over 4 hurdles x 8 reps. Pt with CGA and <3 errors during initial 4  trials. Pt with no errors for final 4 reps.      Prosthetics   Prosthetic Care Comments  Pt was able to donn prostheses with supervision today. PT added sock to RLE to prevent further skin breakdown. PT reassessed blister end of session and wiped drainge away again and reapplied tegaderm to seal skin and prevent further skin breakdown.    Current prosthetic wear tolerance (days/week)  daily    Current prosthetic wear tolerance (#hours/day)  6 hours/day in 2-3 hour increments    Residual limb condition  serous drainage from blister on residual limb. PT wiped with guaze and tegaderm to seal skin and prevent furter draining during exercise session.    Education Provided Skin check;Residual limb care;Correct ply sock adjustment;Proper wear schedule/adjustment;Proper Donning;Proper Doffing;Prosthetic cleaning    Person(s) Educated Patient;Parent(s)    Education Method Explanation;Demonstration;Verbal cues    Education Method Verbalized understanding    Donning Prosthesis Independent    Doffing Prosthesis Independent                  PT Education - 10/04/20 1607    Education Details PT discussed upcoming appointment with Dr. Lajoyce Corners. PT discussed progress towards goals and proper alignment of pylon during functional ambulation. Pt voiced concerns about medial-leaning pylon due to lack of adequate weight-shift onto LLE. PT to incorporate weight-shifting into POC.    Person(s) Educated Patient;Parent(s)    Methods Explanation    Comprehension Verbalized understanding            PT Short Term Goals - 09/29/20 1917      PT SHORT TERM GOAL #1   Title Pt will be independent with prosthetic care and management to manage prosthesis on own.    Baseline currently needs assist to determine proper sock ply    Time 6    Period Weeks    Status New    Target Date 11/10/20      PT SHORT TERM GOAL #2   Title Pt will be able to tolerate wearing prosthesis for 8 hours/day.    Baseline currently up  to 6 hours/day. Progress has been slowed due to small wound on right residual limb 09/07/20    Time 6    Period Weeks    Status On-going    Target Date 11/10/20      PT SHORT TERM GOAL #3   Title Pt will increase Berg form 37/56 to >41/56 for improved balance and decreased fall risk.    Baseline  37/56 on 09/29/20    Time 6    Period Weeks    Status New    Target Date 11/10/20      PT SHORT TERM GOAL #4   Title Pt will ambulate >500' with cane on varied surfaces mod I for improved mobility in community.    Baseline 345' with cane supervision/CGA on level surfaces    Time 6    Period Weeks    Status New    Target Date 11/10/20             PT Long Term Goals - 09/29/20 1922      PT LONG TERM GOAL #1   Title Pt will tolerate wearing prostheses all awake hours with no skin issues for improved function.    Baseline 6 hours/day currently with reoccurring wound on right distal tibia.    Time 12    Period Weeks    Status Revised    Target Date 12/22/20      PT LONG TERM GOAL #2   Title Pt will increase gait speed from 0.55m/s to >0.61m/s for improved gait safety in community.    Baseline 09/29/20 0.29m/s    Time 12    Period Weeks    Status New    Target Date 12/22/20      PT LONG TERM GOAL #3   Title Pt will be able to perform Berg Balance increasing score from 37/56 to >46/56 for improved balance and decreased fall risk.    Baseline 09/29/20 37/56 on Berg    Time 12    Period Weeks    Status Revised    Target Date 12/22/20      PT LONG TERM GOAL #4   Title Pt will ambulate >800' on varied surfaces without AD for improved community mobility independently.    Baseline 345' with cane supervision/CGA    Time 12    Period Weeks    Status New    Target Date 12/22/20      PT LONG TERM GOAL #5   Title Pt will be able to negotiate up/down curb/ramp with LRAD and stairs with railing mod I for improved community access.    Baseline CGA with stair negotiation with 1 rail, min  assist on ramp, min assist +2 on curb with SPC    Time 12    Period Weeks    Status On-going    Target Date 12/22/20                 Plan - 10/04/20 1612    Clinical Impression Statement Pt showed improvement in motor control and coordination with gait, functional obstacle navigation and ramp navigation. Pt shows significant improvements in balance with obstacle navigation. PT to follow up with pt about recurring wound and exudate on RLE. Pt to follow up with Dr. Lajoyce Corners this week.    Personal Factors and Comorbidities Comorbidity 3+    Comorbidities Complete heart block, pacemaker 2018, Tricuspid valve replacements 2018, Bilateral TTAs & hand amputation 2020. Hep C,    Examination-Activity Limitations Locomotion Level;Stand;Transfers;Stairs    Examination-Participation Restrictions Community Activity;Yard Work    Conservation officer, historic buildings Evolving/Moderate complexity    Rehab Potential Good    PT Frequency 1x / week    PT Duration 12 weeks    PT Treatment/Interventions ADLs/Self Care Home Management;Gait training;Stair training;Functional mobility training;Therapeutic activities;DME Instruction;Neuromuscular re-education;Balance training;Therapeutic exercise;Patient/family education;Prosthetic Training;Manual techniques;Passive range of motion;Energy conservation    PT Next Visit Plan Follow up about  visit with Dr. Lajoyce Corners as well as wound management. Stair training, ramp/curb with cane/prostheses, continue gait training with straight cane. Monitor vitals due to extensive cardiac history.    Consulted and Agree with Plan of Care Patient;Family member/caregiver    Family Member Consulted mother, Abigail Butts           Patient will benefit from skilled therapeutic intervention in order to improve the following deficits and impairments:  Abnormal gait, Cardiopulmonary status limiting activity, Decreased activity tolerance, Decreased balance, Decreased endurance, Decreased  mobility, Decreased range of motion, Difficulty walking, Decreased strength, Decreased knowledge of use of DME, Pain, Prosthetic Dependency  Visit Diagnosis: Muscle weakness (generalized)  Unsteadiness on feet  Other abnormalities of gait and mobility     Problem List Patient Active Problem List   Diagnosis Date Noted   Pacemaker - MDT 09/09/2020   Wound of left leg 08/24/2020   Fatigue 07/31/2020   S/P TVR (tricuspid valve replacement) 05/11/2020   Educated about COVID-19 virus infection 05/11/2020   S/P bilateral below knee amputation (HCC) 05/09/2020   Amputation of right arm (HCC) 05/09/2020   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 [place], [place], [address] Phone: [phone]   Fax:  [phone]  Name: Wesley Fuentes MRN: [id number] Date of Birth: May 15, 1976

## 2020-10-05 ENCOUNTER — Telehealth: Payer: Self-pay | Admitting: Cardiology

## 2020-10-05 ENCOUNTER — Ambulatory Visit: Payer: Medicaid Other | Admitting: Occupational Therapy

## 2020-10-05 ENCOUNTER — Ambulatory Visit (INDEPENDENT_AMBULATORY_CARE_PROVIDER_SITE_OTHER): Payer: Medicaid Other | Admitting: Physician Assistant

## 2020-10-05 ENCOUNTER — Encounter: Payer: Self-pay | Admitting: Orthopedic Surgery

## 2020-10-05 DIAGNOSIS — Z89511 Acquired absence of right leg below knee: Secondary | ICD-10-CM

## 2020-10-05 DIAGNOSIS — Z89512 Acquired absence of left leg below knee: Secondary | ICD-10-CM

## 2020-10-05 NOTE — Progress Notes (Signed)
Office Visit Note   Patient: Wesley Fuentes           Date of Birth: August 24, 1976           MRN: [id number] Visit Date: 10/05/2020              Requested by: Doreene Eland, MD [address] [place],  [place] [address] PCP: [name], DO  Chief Complaint  Patient presents with  . Left Knee - Pain  . Right Knee - Pain      HPI: Is a pleasant 44 year old gentleman who presents today with a chief complaint of a small recurrent pustule at the end of his right below-knee amputation stump.  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  He has moved to the area to be near his mother.  He is on chronic minocycline for pacemaker then he has had a valve replacement.  He recently received his prosthetics from [facility] locally.  He was referred for evaluation of an area across the central portion of the right BKA stump that has a recurrent pustule that drains and then heals over.  This is now been going on about 5 weeks.  Assessment & Plan: Visit Diagnoses: No diagnosis found.  Plan: Patient will continue to keep this clean and dry.  He will return if he has recurrence of the pustule.  If this occurs he might need a formal debridement.  Question suture abscess  Follow-Up Instructions: No follow-ups on file.   Ortho Exam  Patient is alert, oriented, no adenopathy, well-dressed, normal affect, normal respiratory effort. Bilateral below-knee amputations in right forearm amputation.  Overall well-healed incisions.  No swelling no cellulitis no foul drainage.  On the right amputation stump he does have a small area with a pinpoint opening.  Dr. Lajoyce Corners after anesthetizing with a cc of lidocaine explored this area with sterile hemostat.  Some purulence was noted but was debrided until only bloody drainage was present.  Probes about 7 to  10 cm deeply.  Band-Aid was placed.  Imaging: No results found. No images are attached to the encounter.  Labs: No results found for: HGBA1C, ESRSEDRATE, CRP, LABURIC, REPTSTATUS, GRAMSTAIN, CULT, LABORGA   Lab Results  Component Value Date   ALBUMIN 4.3 06/20/2020   ALBUMIN 5.0 12/17/2017    No results found for: MG No results found for: VD25OH  No results found for: PREALBUMIN CBC EXTENDED Latest Ref Rng & Units 06/20/2020 12/17/2017  WBC 3.4 - 10.8 x10E3/uL 4.4 7.1  RBC 4.14 - 5.80 x10E6/uL 5.20 5.55  HGB 13.0 - 17.7 g/dL 11.3(L) 14.8  HCT 37.5 - 51.0 % 38.4 45.9  PLT 150 - 450 x10E3/uL 188 257     There is no height or weight on file to calculate BMI.  Orders:  No orders of the defined types were placed in this encounter.  No orders of the defined types were placed in this encounter.    Procedures: No procedures performed  Clinical Data: No additional findings.  ROS:  All other systems negative, except as noted in the HPI. Review of Systems  Objective: Vital Signs: There were no vitals taken for this visit.  Specialty Comments:  No specialty comments available.  PMFS History: Patient Active Problem List   Diagnosis Date Noted  . Pacemaker - MDT 09/09/2020  . Wound of left leg  08/24/2020  . Fatigue 07/31/2020  . 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 Educated about COVID-19 virus infection 05/11/2020  . S/P bilateral below knee amputation (HCC) 05/09/2020  . 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 History of bacterial endocarditis 05/02/2020  . 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 Complete heart block (HCC) 12/11/2017  . 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 Opioid use disorder, moderate, in early remission (HCC) 12/11/2017  . Depression 12/11/2017   Past Medical History:  Diagnosis Date  . CHB (complete heart block) (HCC)   . Depression 12/11/2017  . Hepatitis C   . History of  complete heart block 12/11/2017  . 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 Opioid use disorder, moderate, in early remission (HCC) 12/11/2017    Family History  Problem Relation Age of Onset  . Diabetes Maternal Grandmother   . Diabetes Maternal Grandfather   . Cancer Maternal Grandfather        oral  . Cervical cancer Mother     Past Surgical History:  Procedure Laterality Date  . Permanent pacemaker placement     On 11/24/17-had [facility] Jude dual-chamber permanent pacemaker placed.  . Tricuspid valve replacement     On 11/17/17-replacement of tricuspid valve with a 29 mm Medtronic Mosaic mitral prosthesis.   Social History   Occupational History  . Not on file  Tobacco Use  . Smoking status: Former Games developer  . Smokeless tobacco: Never Used  Substance and Sexual Activity  . Alcohol use: Not on file  . Drug use: Not on file  . Sexual activity: Not on file

## 2020-10-05 NOTE — Telephone Encounter (Signed)
Mother states that some records from [place] where patient had heart surgery(valve replacement) + pacemaker that this indicated he should take xarelto until July. Explained that 06/28/20 MD note did not advise to change this medication.   Will send to Dr. Janell Quiet needs to be relayed to mother/PCP

## 2020-10-05 NOTE — Telephone Encounter (Signed)
New Message:   Pt's Mother is calling. She needs to know if pt still need to be on Xarelto? She said  his primary doctor wanted to know this.

## 2020-10-06 NOTE — Telephone Encounter (Signed)
I reviewed his previous notes and he had a right IJ DVT and therefore has completed therapy.  He can stop his Xarelto.  I would have him on aspirin daily.

## 2020-10-06 NOTE — Telephone Encounter (Signed)
Patient's mother aware of med change per Dr. Antoine Poche. She will have him start aspirin 81mg  daily and stop xarelto Routed to PCP per her request

## 2020-10-06 NOTE — Telephone Encounter (Signed)
Perfect, thank you so much for the update! Will stop xarelto and start ASA 81mg  QD.  I appreciate all you guys do!  Floreen Teegarden

## 2020-10-09 ENCOUNTER — Other Ambulatory Visit: Payer: Self-pay | Admitting: Family Medicine

## 2020-10-11 ENCOUNTER — Other Ambulatory Visit: Payer: Self-pay

## 2020-10-11 ENCOUNTER — Ambulatory Visit: Payer: Medicaid Other | Admitting: Physical Therapy

## 2020-10-11 ENCOUNTER — Encounter: Payer: Self-pay | Admitting: Physical Therapy

## 2020-10-11 DIAGNOSIS — R2689 Other abnormalities of gait and mobility: Secondary | ICD-10-CM

## 2020-10-11 DIAGNOSIS — M6281 Muscle weakness (generalized): Secondary | ICD-10-CM

## 2020-10-11 DIAGNOSIS — R293 Abnormal posture: Secondary | ICD-10-CM

## 2020-10-11 DIAGNOSIS — R2681 Unsteadiness on feet: Secondary | ICD-10-CM

## 2020-10-13 NOTE — Therapy (Signed)
[facility] [address] [place], [place], [address] Phone: [phone]   Fax:  [phone]  Physical Therapy Treatment  Patient Details  Name: Wesley Fuentes MRN: [id number] Date of Birth: 10-Mar-1976 Referring Provider (PT): Janit Pagan, MD referred by PCP is [name]   Encounter Date: 10/11/2020     10/11/20 1630  PT Visits / Re-Eval  Visit Number 12  Number of Visits 22 (saving 3 visits after 12 weeks with thought OT may use about 8)  Authorization  Authorization Type 3 visits 7/19 to 8/8, 12 visits 8/10-10/4, 11 visit 10/6-12/28  Authorization - Visit Number 2  Authorization - Number of Visits 11  PT Time Calculation  PT Start Time 1532  PT Stop Time 1615  PT Time Calculation (min) 43 min  PT - End of Session  Equipment Utilized During Treatment Gait belt;Other (comment) (Protheses)  Activity Tolerance Patient tolerated treatment well;No increased pain  Behavior During Therapy WFL for tasks assessed/performed    Past Medical History:  Diagnosis Date  . CHB (complete heart block) (HCC)   . Depression 12/11/2017  . Hepatitis C   . History of complete heart block 12/11/2017  . 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 Opioid use disorder, moderate, in early remission (HCC) 12/11/2017    Past Surgical History:  Procedure Laterality Date  . Permanent pacemaker placement     On 11/24/17-had [facility] Jude dual-chamber permanent pacemaker placed.  . Tricuspid valve replacement     On 11/17/17-replacement of tricuspid valve with a 29 mm Medtronic Mosaic mitral prosthesis.    There were no vitals filed for this visit.     10/11/20 1545  Symptoms/Limitations  Subjective Went to [facility] yesterday where he had pads added and adjustements made. Saw Dr. Lajoyce Corners as well who debrided his wound. Was told to monitor it and if drainage continues he is to call the office back.  Reports he has continued to have drainage and was advised to call Dr. Audrie Lia office to let them know this. He also reports increased pain/tighness on right limb since having pads added.  Patient is accompained by: Family member  Pertinent History Complete heart block, pacemaker 2018, Tricuspid valve replacements 2018, Bilateral TTAs & hand amputation 2020. Hep C,  Limitations Standing;Walking;House hold activities  Patient Stated Goals Pt wants to be able to get back to his hobbies and work. Wants to be able to walk by September when going back to [place].  Pain Assessment  Currently in Pain? Yes  Pain Location Leg  Pain Orientation Right  Pain Descriptors / Indicators Aching;Tightness;Pressure  Pain Type Acute pain;Phantom pain  Pain Onset In the past 7 days  Pain Frequency Occasional  Aggravating Factors  prosthesis wear  Pain Relieving Factors removing prosthesis, correct sock ply      10/11/20 1554  Transfers  Transfers Sit to Stand;Stand to Sit  Sit to Stand 5: Supervision  Stand to Sit 5: Supervision  Ambulation/Gait  Ambulation/Gait Yes  Ambulation/Gait Assistance 5: Supervision;4: Min guard  Ambulation/Gait Assistance Details adjusted sock ply on right due to pt seated too low. reported improvement in pain/pressure with gait around track while working to decreased base of support. initiated gait outdoors for short distance over gravel, grass, rubber mulch and paved surfaces with up to min assist for balance at times. improved base of support with gait around gym for remainder of session.   Ambulation Distance (Feet) 230 Feet (x1, plus outdoors, plus  around gym with session)  Assistive device Straight cane;Prostheses  Gait Pattern Step-through pattern;Decreased weight shift to left  Ambulation Surface Level;Indoor;Unlevel;Outdoor;Paved;Gravel;Grass;Other (comment) (rubber mulch)  Ramp Other (comment);4: Min assist (min guard assist)  Ramp Details (indicate cue type and reason) min  assist to ascend, min guard assist to descend working on momentum to ascend and to keep going with desending. this improved pt's balance and technique, however pt will benefit from continued practice.    Curb 4: Min assist;Other (comment) (min guard assist)  Curb Details (indicate cue type and reason) bloc practice of ascending/descending both aerobic steps with empahsis on having momentum with approach and to keep going with stepping off with min guard asssit, occasional needed of min assist due to mis step with descent using cane.    Prosthetics  Prosthetic Care Comments  increased pt's sock ply to 1 ply on right side due to seated too low in socket with improvement in pain/discomfort for session with good patella positioning.   Current prosthetic wear tolerance (days/week)  daily  Current prosthetic wear tolerance (#hours/day)  2-3 hours, 1 hour off, rotation with bil prosthesis. pt to increase his wear time on left prosthesis to all moring, off for 1-2 hours midday, the back on until bedtime. to keep the same schedule on right side due to wound.   Residual limb condition  on right limb: bruise on distal posterior end and at lateral incision area. still with open (pin point) wound on anterior tib area. no drainage this session. Pt and mom report it still occurs at times. Pt to call Dr. Audrie Lia office tomorrow if continues tonight as Friday will be a week since seeing him. left limb- intact with mild heat rash present.   Education Provided Residual limb care;Correct ply sock adjustment;Proper wear schedule/adjustment;Proper weight-bearing schedule/adjustment  Person(s) Educated Patient;Parent(s)  Education Method Explanation;Demonstration;Verbal cues;Handout  Education Method Verbalized understanding;Returned demonstration;Verbal cues required;Needs further instruction  Donning Prosthesis 7  Doffing Prosthesis 7         PT Short Term Goals - 09/29/20 1917      PT SHORT TERM GOAL #1   Title  Pt will be independent with prosthetic care and management to manage prosthesis on own.    Baseline currently needs assist to determine proper sock ply    Time 6    Period Weeks    Status New    Target Date 11/10/20      PT SHORT TERM GOAL #2   Title Pt will be able to tolerate wearing prosthesis for 8 hours/day.    Baseline currently up to 6 hours/day. Progress has been slowed due to small wound on right residual limb 09/07/20    Time 6    Period Weeks    Status On-going    Target Date 11/10/20      PT SHORT TERM GOAL #3   Title Pt will increase Berg form 37/56 to >41/56 for improved balance and decreased fall risk.    Baseline 37/56 on 09/29/20    Time 6    Period Weeks    Status New    Target Date 11/10/20      PT SHORT TERM GOAL #4   Title Pt will ambulate >500' with cane on varied surfaces mod I for improved mobility in community.    Baseline 345' with cane supervision/CGA on level surfaces    Time 6    Period Weeks    Status New    Target Date 11/10/20  PT Long Term Goals - 09/29/20 1922      PT LONG TERM GOAL #1   Title Pt will tolerate wearing prostheses all awake hours with no skin issues for improved function.    Baseline 6 hours/day currently with reoccurring wound on right distal tibia.    Time 12    Period Weeks    Status Revised    Target Date 12/22/20      PT LONG TERM GOAL #2   Title Pt will increase gait speed from 0.53m/s to >0.106m/s for improved gait safety in community.    Baseline 09/29/20 0.41m/s    Time 12    Period Weeks    Status New    Target Date 12/22/20      PT LONG TERM GOAL #3   Title Pt will be able to perform Berg Balance increasing score from 37/56 to >46/56 for improved balance and decreased fall risk.    Baseline 09/29/20 37/56 on Berg    Time 12    Period Weeks    Status Revised    Target Date 12/22/20      PT LONG TERM GOAL #4   Title Pt will ambulate >800' on varied surfaces without AD for improved community  mobility independently.    Baseline 345' with cane supervision/CGA    Time 12    Period Weeks    Status New    Target Date 12/22/20      PT LONG TERM GOAL #5   Title Pt will be able to negotiate up/down curb/ramp with LRAD and stairs with railing mod I for improved community access.    Baseline CGA with stair negotiation with 1 rail, min assist on ramp, min assist +2 on curb with SPC    Time 12    Period Weeks    Status On-going    Target Date 12/22/20             10/11/20 1630  Plan  Clinical Impression Statement Today's skilled session continued to focus on prosthetic education and gait/barriers with cane/prostheses with up to min assist needed at times. The pt demo'd improved technique with curb negotiation with bloc practice this session. The pt is progressing toward goals and should benefit from continued PT to progress toward unmet goals.  Personal Factors and Comorbidities Comorbidity 3+  Comorbidities Complete heart block, pacemaker 2018, Tricuspid valve replacements 2018, Bilateral TTAs & hand amputation 2020. Hep C,  Examination-Activity Limitations Locomotion Level;Stand;Transfers;Stairs  Examination-Participation Restrictions Community Activity;Yard Work  Pt will benefit from skilled therapeutic intervention in order to improve on the following deficits Abnormal gait;Cardiopulmonary status limiting activity;Decreased activity tolerance;Decreased balance;Decreased endurance;Decreased mobility;Decreased range of motion;Difficulty walking;Decreased strength;Decreased knowledge of use of DME;Pain;Prosthetic Dependency  Stability/Clinical Decision Making Evolving/Moderate complexity  Rehab Potential Good  PT Frequency 1x / week  PT Duration 12 weeks  PT Treatment/Interventions ADLs/Self Care Home Management;Gait training;Stair training;Functional mobility training;Therapeutic activities;DME Instruction;Neuromuscular re-education;Balance training;Therapeutic  exercise;Patient/family education;Prosthetic Training;Manual techniques;Passive range of motion;Energy conservation  PT Next Visit Plan Stair training, ramp/curb with cane/prostheses, continue gait training with straight cane. Monitor vitals due to extensive cardiac history.  Consulted and Agree with Plan of Care Patient;Family member/caregiver  Family Member Consulted mother, Abigail Butts          Patient will benefit from skilled therapeutic intervention in order to improve the following deficits and impairments:  Abnormal gait, Cardiopulmonary status limiting activity, Decreased activity tolerance, Decreased balance, Decreased endurance, Decreased mobility, Decreased range of motion, Difficulty walking, Decreased strength,  Decreased knowledge of use of DME, Pain, Prosthetic Dependency  Visit Diagnosis: Muscle weakness (generalized)  Unsteadiness on feet  Other abnormalities of gait and mobility  Abnormal posture     Problem List Patient Active Problem List   Diagnosis Date Noted  . Pacemaker - MDT 09/09/2020  . Wound of left leg 08/24/2020  . Fatigue 07/31/2020  . 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 Educated about COVID-19 virus infection 05/11/2020  . S/P bilateral below knee amputation (HCC) 05/09/2020  . 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 History of bacterial endocarditis 05/02/2020  . 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 Complete heart block (HCC) 12/11/2017  . 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 Opioid use disorder, moderate, in early remission (HCC) 12/11/2017  . Depression 12/11/2017    Sallyanne KusterKathy Peytin Dechert, PTA, [facility] [address], [address] [place], [place] [address] [phone] 10/13/20, 8:41 AM   Name: Wesley Fuentes MRN: [id number] Date of Birth: May 19, 1976

## 2020-10-16 ENCOUNTER — Encounter: Payer: Medicaid Other | Admitting: Cardiology

## 2020-10-17 ENCOUNTER — Encounter: Payer: Self-pay | Admitting: Orthopedic Surgery

## 2020-10-17 ENCOUNTER — Ambulatory Visit (INDEPENDENT_AMBULATORY_CARE_PROVIDER_SITE_OTHER): Payer: Medicaid Other | Admitting: Physician Assistant

## 2020-10-17 DIAGNOSIS — Z89512 Acquired absence of left leg below knee: Secondary | ICD-10-CM

## 2020-10-17 DIAGNOSIS — Z89511 Acquired absence of right leg below knee: Secondary | ICD-10-CM

## 2020-10-17 NOTE — Progress Notes (Signed)
Office Visit Note   Patient: Wesley Fuentes           Date of Birth: 16-Oct-1976           MRN: [id number] Visit Date: 10/17/2020              Requested by: Lurline Del, DO [address] [place],  [place] [address] PCP: Lurline Del, DO  Chief Complaint  Patient presents with  . Right Leg - Wound Check      HPI: Patient presents in follow-up today from his suture abscess on his right below-knee amputation stump.  This was explored in the office at his last visit.  While he says it is gotten a little bit better he still has had now 6 weeks of pinpoint purulent drainage from the area.  He has also taken antibiotics.  He is wondering what the next step is  Assessment & Plan: Visit Diagnoses: No diagnosis found.  Plan: Dr. Sharol Given met with the patient directly recommended a revision below-knee amputation.  We will plan for this in the next week or so  Follow-Up Instructions: No follow-ups on file.   Ortho Exam  Patient is alert, oriented, no adenopathy, well-dressed, normal affect, normal respiratory effort. Right below-knee amputations dump no cellulitis.  He does have a pinpoint area on the anterior tibia that expresses some purulent fluid with surrounding scar tissue.  No foul odor.  It is tender to palpation over the scar tissue.  Heart regular rate and rhythm lungs clear  Imaging: No results found. No images are attached to the encounter.  Labs: No results found for: HGBA1C, ESRSEDRATE, CRP, LABURIC, REPTSTATUS, GRAMSTAIN, CULT, LABORGA   Lab Results  Component Value Date   ALBUMIN 4.3 06/20/2020   ALBUMIN 5.0 12/17/2017    No results found for: MG No results found for: VD25OH  No results found for: PREALBUMIN CBC EXTENDED Latest Ref Rng & Units 06/20/2020 12/17/2017  WBC 3.4 - 10.8 x10E3/uL 4.4 7.1  RBC 4.14 - 5.80 x10E6/uL 5.20 5.55  HGB 13.0 - 17.7 g/dL 11.3(L) 14.8  HCT 37.5 - 51.0 % 38.4 45.9  PLT 150 - 450 x10E3/uL 188 257     There is no height or  weight on file to calculate BMI.  Orders:  No orders of the defined types were placed in this encounter.  No orders of the defined types were placed in this encounter.    Procedures: No procedures performed  Clinical Data: No additional findings.  ROS:  All other systems negative, except as noted in the HPI. Review of Systems  Objective: Vital Signs: There were no vitals taken for this visit.  Specialty Comments:  No specialty comments available.  PMFS History: Patient Active Problem List   Diagnosis Date Noted  . Pacemaker - MDT 09/09/2020  . Wound of left leg 08/24/2020  . Fatigue 07/31/2020  . 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 Educated about COVID-19 virus infection 05/11/2020  . S/P bilateral below knee amputation ([place]) 05/09/2020  . Amputation of right arm ([place]) 05/09/2020  . History of bacterial endocarditis 05/02/2020  . 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 Complete heart block ([place]) 12/11/2017  . 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 Opioid use disorder, moderate, in early remission ([place]) 12/11/2017  . Depression 12/11/2017   Past Medical History:  Diagnosis Date  . CHB (complete heart block) ([place])   . Depression 12/11/2017  . Hepatitis C   .  History of complete heart block 12/11/2017  . 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 Opioid use disorder, moderate, in early remission ([place]) 12/11/2017    Family History  Problem Relation Age of Onset  . Diabetes Maternal Grandmother   . Diabetes Maternal Grandfather   . Cancer Maternal Grandfather        oral  . Cervical cancer Mother     Past Surgical History:  Procedure Laterality Date  . Permanent pacemaker placement     On 11/24/17-had [facility] Jude dual-chamber permanent pacemaker placed.  . Tricuspid valve replacement     On 11/17/17-replacement of tricuspid valve with a 29 mm Medtronic Mosaic mitral  prosthesis.   Social History   Occupational History  . Not on file  Tobacco Use  . Smoking status: Former Research scientist (life sciences)  . Smokeless tobacco: Never Used  Substance and Sexual Activity  . Alcohol use: Not on file  . Drug use: Not on file  . Sexual activity: Not on file

## 2020-10-18 ENCOUNTER — Telehealth: Payer: Self-pay | Admitting: Physical Therapy

## 2020-10-18 ENCOUNTER — Ambulatory Visit: Payer: Medicaid Other | Admitting: Physical Therapy

## 2020-10-18 NOTE — Telephone Encounter (Signed)
Called patient to discuss upcoming BKA revision by Dr. Lajoyce Corners and going on hold with PT to conserve Medicaid visits for after surgery. Spoke with mom who was with pt at another doctors office. Both in agreement with this plan and requested to hold OT as well and resume all therapies when cleared to return after surgery. Visits removed with pt/mom to call back to schedule with he is cleared to return.  Sallyanne Kuster, PTA, [facility] [address] [place], [place] [address] [phone] 10/18/20, 10:43 AM

## 2020-10-23 ENCOUNTER — Other Ambulatory Visit: Payer: Self-pay | Admitting: Physician Assistant

## 2020-10-23 ENCOUNTER — Other Ambulatory Visit (HOSPITAL_COMMUNITY)
Admission: RE | Admit: 2020-10-23 | Discharge: 2020-10-23 | Disposition: A | Discharge status: Home / Self Care | Payer: Medicaid Other | Source: Ambulatory Visit | Attending: Orthopedic Surgery | Admitting: Orthopedic Surgery

## 2020-10-23 DIAGNOSIS — Z20822 Contact with and (suspected) exposure to covid-19: Secondary | ICD-10-CM | POA: Insufficient documentation

## 2020-10-23 DIAGNOSIS — Z01812 Encounter for preprocedural laboratory examination: Secondary | ICD-10-CM | POA: Insufficient documentation

## 2020-10-23 LAB — SARS CORONAVIRUS 2 (TAT 6-24 HRS): SARS Coronavirus 2: NEGATIVE

## 2020-10-24 ENCOUNTER — Encounter: Payer: Self-pay | Admitting: Internal Medicine

## 2020-10-24 ENCOUNTER — Encounter: Payer: Medicaid Other | Admitting: Occupational Therapy

## 2020-10-24 ENCOUNTER — Encounter (HOSPITAL_COMMUNITY): Payer: Self-pay | Admitting: Orthopedic Surgery

## 2020-10-24 NOTE — Anesthesia Preprocedure Evaluation (Addendum)
Anesthesia Evaluation  Patient identified by MRN, date of birth, ID band Patient awake    Reviewed: Allergy & Precautions, NPO status , Patient's Chart, lab work & pertinent test results  Airway Mallampati: II  TM Distance: >3 FB Neck ROM: Full    Dental  (+) Dental Advisory Given, Missing, Chipped   Pulmonary neg pulmonary ROS, former smoker,    Pulmonary exam normal breath sounds clear to auscultation       Cardiovascular Normal cardiovascular exam+ dysrhythmias + pacemaker + Valvular Problems/Murmurs  Rhythm:Regular Rate:Normal     Neuro/Psych PSYCHIATRIC DISORDERS Depression negative neurological ROS     GI/Hepatic negative GI ROS, (+) Hepatitis -  Endo/Other  negative endocrine ROS  Renal/GU negative Renal ROS     Musculoskeletal negative musculoskeletal ROS (+)   Abdominal   Peds  Hematology negative hematology ROS (+)   Anesthesia Other Findings   Reproductive/Obstetrics                            Anesthesia Physical Anesthesia Plan  ASA: III  Anesthesia Plan: General   Post-op Pain Management:    Induction: Intravenous  PONV Risk Score and Plan: 3 and Ondansetron, Dexamethasone, Treatment may vary due to age or medical condition and Midazolam  Airway Management Planned: LMA  Additional Equipment: None  Intra-op Plan:   Post-operative Plan: Extubation in OR  Informed Consent: I have reviewed the patients History and Physical, chart, labs and discussed the procedure including the risks, benefits and alternatives for the proposed anesthesia with the patient or authorized representative who has indicated his/her understanding and acceptance.     Dental advisory given  Plan Discussed with: CRNA  Anesthesia Plan Comments: (PAT note by Antionette Poles, PA-C: Complicated medical history.  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  He has moved to the area to be near his mother.  He is on chronic minocycline for pacemaker (implanted due to CHB that occurred after tricuspid valve replacement).  He has severe residual tricuspid stenosis s/p TVR.  He was hospitalized in August 2020 with endocarditis, complicated with septic emboli to bilateral lower and right upper extremities necessitating amputations.  He had PPM site erosion with MRSA.  He underwent pacer extraction and reimplantation at abdominal site.  Follows with cardiologist Dr. Antoine Poche.  Last seen by Dr. Antoine Poche 06/28/2020.  TVR with severe residual TV stenosis was discussed.  Per note, "He has severe TV stenosis.   However, at present he is really not having symptoms.  He has some low blood pressure but no symptoms related to this.  He does not have any significant abdominal distention or lower extremity edema.  He understands endocarditis prophylaxis.  He is on suppressive antibiotics and has no active evidence of fevers chills or ongoing infection.  I think we can follow this clinically.  I had a long discussion with the patient and his mom about this."  He was subsequently cleared to undergo dental surgery in July 2021.  Clearance at that time per telephone encounter dated 07/17/2020.  Pacemaker is followed by EP cardiologist Dr. Graciela Husbands.  Last seen 06/20/2020.  Device was noted to be functioning normally.  He had his VVI pacing changed to VVIR.  Periop device form has been sent to EP clinic.   He will need day of surgery labs and  evaluation.  EKG 06/20/2020 (read per Dr. Odessa Fleming note): sinus rhythm with complete heart block.  Ventricular pacing with an upright QRS lead V1 and a negative QRS lead I  TTE 06/21/2020: 1. Normal LV systolic function; grade 1 diastolic dysfunction; mild LAE;  s/p TVR with elevated mean gradient of 12 mmHg suggesting severe stenosis;  mild  TR; findings similar to report 03/16/20 from [place].  2. Left ventricular ejection fraction, by estimation, is 55 to 60%. The  left ventricle has normal function. The left ventricle has no regional  wall motion abnormalities. Left ventricular diastolic parameters are  consistent with Grade I diastolic  dysfunction (impaired relaxation).  3. Right ventricular systolic function is normal. The right ventricular  size is normal. Tricuspid regurgitation signal is inadequate for assessing  PA pressure.  4. Left atrial size was mildly dilated.  5. The mitral valve is normal in structure. Trivial mitral valve  regurgitation. No evidence of mitral stenosis.  6. S/P 87mm Medtronic Mosaic tricuspid bioprosthesis. The TV Vmax 222  cm/s, mean TV gradient and TVA but 3D imaging 2.19cm2. . The  tricuspid valve is has been repaired/replaced. Severe tricuspid stenosis.  7. The aortic valve is tricuspid. Aortic valve regurgitation is not  visualized. No aortic stenosis is present.  8. The inferior vena cava is normal in size with greater than 50%  respiratory variability, suggesting right atrial pressure of 3 mmHg.  )       Anesthesia Quick Evaluation

## 2020-10-24 NOTE — Progress Notes (Signed)
PERIOPERATIVE PRESCRIPTION FOR IMPLANTED CARDIAC DEVICE PROGRAMMING  Patient Information: Name:  Wesley Fuentes  DOB:  10-01-1976  MRN:  [id number]    Planned Procedure: Revision Right Below Knee Amputation  Surgeon: Aldean Baker  Date of Procedure: 10/25/2020  Cautery will be used.  Position during surgery: supine   Please send documentation back to:  [facility] (Fax # [phone])   Stevphen Meuse, RN  10/24/2020 9:12 AM   Device Information:  Clinic EP Physician:  Sherryl Manges, MD   Device Type:  Pacemaker Manufacturer and Phone #:  Medtronic: [phone] Pacemaker Dependent?:  Yes.   Date of Last Device Check:  09/06/20 Remote; 06/20/20 in clinic  Normal Device Function?:  Yes.    Electrophysiologist's Recommendations:   Have magnet available.  Provide continuous ECG monitoring when magnet is used or reprogramming is to be performed.   Procedure may interfere with device function.  Magnet should be placed over device during procedure.  **please note device is in pt abdomen, it is prominent, however if any difficulty locating device you will need to have industry present.  Per Device Clinic Standing Orders, Linton Ham, RN  1:46 PM 10/24/2020

## 2020-10-24 NOTE — Progress Notes (Signed)
Anesthesia Chart Review: Same day workup  Complicated medical history.  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  He has moved to the area to be near his mother.  He is on chronic minocycline for pacemaker (implanted due to CHB that occurred after tricuspid valve replacement).  He has severe residual tricuspid stenosis s/p TVR.  He was hospitalized in August 2020 with endocarditis, complicated with septic emboli to bilateral lower and right upper extremities necessitating amputations.  He had PPM site erosion with MRSA.  He underwent pacer extraction and reimplantation at abdominal site.  Follows with cardiologist Dr. Antoine Poche.  Last seen by Dr. Antoine Poche 06/28/2020.  TVR with severe residual TV stenosis was discussed.  Per note, "He has severe TV stenosis.   However, at present he is really not having symptoms.  He has some low blood pressure but no symptoms related to this.  He does not have any significant abdominal distention or lower extremity edema.  He understands endocarditis prophylaxis.  He is on suppressive antibiotics and has no active evidence of fevers chills or ongoing infection.  I think we can follow this clinically.  I had a long discussion with the patient and his mom about this."  He was subsequently cleared to undergo dental surgery in July 2021.  Clearance at that time per telephone encounter dated 07/17/2020.  Pacemaker is followed by EP cardiologist Dr. Graciela Husbands.  Last seen 06/20/2020.  Device was noted to be functioning normally.  He had his VVI pacing changed to VVIR.  Periop device form has been sent to EP clinic.   He will need day of surgery labs and evaluation.  EKG 06/20/2020 (read per Dr. Odessa Fleming note): sinus rhythm with complete heart block.  Ventricular pacing with an upright QRS lead V1 and a negative QRS lead I  TTE  06/21/2020: 1. Normal LV systolic function; grade 1 diastolic dysfunction; mild LAE;  s/p TVR with elevated mean gradient of 12 mmHg suggesting severe stenosis;  mild TR; findings similar to report 03/16/20 from [place].  2. Left ventricular ejection fraction, by estimation, is 55 to 60%. The  left ventricle has normal function. The left ventricle has no regional  wall motion abnormalities. Left ventricular diastolic parameters are  consistent with Grade I diastolic  dysfunction (impaired relaxation).  3. Right ventricular systolic function is normal. The right ventricular  size is normal. Tricuspid regurgitation signal is inadequate for assessing  PA pressure.  4. Left atrial size was mildly dilated.  5. The mitral valve is normal in structure. Trivial mitral valve  regurgitation. No evidence of mitral stenosis.  6. S/P 30mm Medtronic Mosaic tricuspid bioprosthesis. The TV Vmax 222  cm/s, mean TV gradient and TVA but 3D imaging 2.19cm2. . The  tricuspid valve is has been repaired/replaced. Severe tricuspid stenosis.  7. The aortic valve is tricuspid. Aortic valve regurgitation is not  visualized. No aortic stenosis is present.  8. The inferior vena cava is normal in size with greater than 50%  respiratory variability, suggesting right atrial pressure of 3 mmHg.    Zannie Cove [facility]/Anesthesiology Phone [phone] 10/24/2020 12:13 PM

## 2020-10-24 NOTE — Progress Notes (Signed)
Spoke with mom for pre-op phone call. Denies chest pain or shortness of breath. Patient with pacemaker. Sent request for orders to device clinic and emailed representative that they may be needed.   Mother is taking patient to methadone clinic prior to his arrival at Short Stay which may create a slight delay in arrival. Educated on visitation policy. Mother reports patient has been in quarantine since COVID test. Mother will contact Dr. Audrie Lia office and ask about ASA. Celesta Gentile, [place] and requested he review chart.

## 2020-10-25 ENCOUNTER — Telehealth: Payer: Self-pay | Admitting: Orthopedic Surgery

## 2020-10-25 ENCOUNTER — Ambulatory Visit: Payer: Medicaid Other

## 2020-10-25 ENCOUNTER — Other Ambulatory Visit: Payer: Self-pay | Admitting: Orthopedic Surgery

## 2020-10-25 ENCOUNTER — Ambulatory Visit (HOSPITAL_COMMUNITY): Payer: Medicaid Other | Admitting: Physician Assistant

## 2020-10-25 ENCOUNTER — Ambulatory Visit (HOSPITAL_COMMUNITY)
Admission: RE | Admit: 2020-10-25 | Discharge: 2020-10-25 | Disposition: A | Discharge status: Home / Self Care | Payer: Medicaid Other | Attending: Orthopedic Surgery | Admitting: Orthopedic Surgery

## 2020-10-25 ENCOUNTER — Other Ambulatory Visit: Payer: Self-pay

## 2020-10-25 ENCOUNTER — Encounter (HOSPITAL_COMMUNITY)
Admission: RE | Disposition: A | Discharge status: Home / Self Care | Payer: Self-pay | Source: Home / Self Care | Attending: Orthopedic Surgery

## 2020-10-25 ENCOUNTER — Encounter: Payer: Medicaid Other | Admitting: Internal Medicine

## 2020-10-25 ENCOUNTER — Encounter (HOSPITAL_COMMUNITY): Payer: Self-pay | Admitting: Orthopedic Surgery

## 2020-10-25 DIAGNOSIS — M86461 Chronic osteomyelitis with draining sinus, right tibia and fibula: Secondary | ICD-10-CM

## 2020-10-25 DIAGNOSIS — Y835 Amputation of limb(s) as the cause of abnormal reaction of the patient, or of later complication, without mention of misadventure at the time of the procedure: Secondary | ICD-10-CM | POA: Diagnosis not present

## 2020-10-25 DIAGNOSIS — I442 Atrioventricular block, complete: Secondary | ICD-10-CM

## 2020-10-25 DIAGNOSIS — T8789 Other complications of amputation stump: Secondary | ICD-10-CM | POA: Diagnosis not present

## 2020-10-25 DIAGNOSIS — Z89511 Acquired absence of right leg below knee: Secondary | ICD-10-CM | POA: Diagnosis not present

## 2020-10-25 DIAGNOSIS — Z87891 Personal history of nicotine dependence: Secondary | ICD-10-CM | POA: Diagnosis not present

## 2020-10-25 DIAGNOSIS — Z89512 Acquired absence of left leg below knee: Secondary | ICD-10-CM | POA: Diagnosis not present

## 2020-10-25 DIAGNOSIS — T8781 Dehiscence of amputation stump: Secondary | ICD-10-CM

## 2020-10-25 DIAGNOSIS — Z950 Presence of cardiac pacemaker: Secondary | ICD-10-CM

## 2020-10-25 HISTORY — PX: STUMP REVISION: SHX6102

## 2020-10-25 HISTORY — DX: Rheumatic tricuspid valve disease, unspecified: I07.9

## 2020-10-25 HISTORY — DX: Presence of cardiac pacemaker: Z95.0

## 2020-10-25 HISTORY — DX: Methicillin resistant Staphylococcus aureus infection, unspecified site: A49.02

## 2020-10-25 LAB — CBC
HCT: 43 % (ref 39.0–52.0)
Hemoglobin: 13.1 g/dL (ref 13.0–17.0)
MCH: 23.6 pg — ABNORMAL LOW (ref 26.0–34.0)
MCHC: 30.5 g/dL (ref 30.0–36.0)
MCV: 77.5 fL — ABNORMAL LOW (ref 80.0–100.0)
Platelets: 156 10*3/uL (ref 150–400)
RBC: 5.55 MIL/uL (ref 4.22–5.81)
RDW: 20.9 % — ABNORMAL HIGH (ref 11.5–15.5)
WBC: 4.4 10*3/uL (ref 4.0–10.5)
nRBC: 0 % (ref 0.0–0.2)

## 2020-10-25 LAB — COMPREHENSIVE METABOLIC PANEL
ALT: 14 U/L (ref 0–44)
AST: 20 U/L (ref 15–41)
Albumin: 3.9 g/dL (ref 3.5–5.0)
Alkaline Phosphatase: 111 U/L (ref 38–126)
Anion gap: 10 (ref 5–15)
BUN: 20 mg/dL (ref 6–20)
CO2: 23 mmol/L (ref 22–32)
Calcium: 9.1 mg/dL (ref 8.9–10.3)
Chloride: 104 mmol/L (ref 98–111)
Creatinine, Ser: 0.83 mg/dL (ref 0.61–1.24)
GFR, Estimated: >60 mL/min (ref >60)
Glucose, Bld: 90 mg/dL (ref 70–99)
Potassium: 4.2 mmol/L (ref 3.5–5.1)
Sodium: 137 mmol/L (ref 135–145)
Total Bilirubin: 0.7 mg/dL (ref 0.3–1.2)
Total Protein: 7.3 g/dL (ref 6.5–8.1)

## 2020-10-25 SURGERY — REVISION, AMPUTATION SITE
Anesthesia: General | Site: Knee | Laterality: Right

## 2020-10-25 MED ORDER — OXYCODONE-ACETAMINOPHEN 5-325 MG PO TABS: 1.0000 | ORAL_TABLET | ORAL | 0 refills | Status: DC | PRN

## 2020-10-25 MED ORDER — CHLORHEXIDINE GLUCONATE 0.12 % MT SOLN
15.0000 mL | Freq: Once | OROMUCOSAL | Status: AC
  Administered 2020-10-25: 15 mL via OROMUCOSAL
  Filled 2020-10-25: qty 15

## 2020-10-25 MED ORDER — LIDOCAINE HCL (CARDIAC) PF 100 MG/5ML IV SOSY
PREFILLED_SYRINGE | INTRAVENOUS | Status: DC | PRN
  Administered 2020-10-25: 100 mg via INTRAVENOUS

## 2020-10-25 MED ORDER — 0.9 % SODIUM CHLORIDE (POUR BTL) OPTIME
TOPICAL | Status: DC | PRN
  Administered 2020-10-25: 1000 mL

## 2020-10-25 MED ORDER — PROMETHAZINE HCL 25 MG/ML IJ SOLN: 6.2500 mg | INTRAMUSCULAR | Status: DC | PRN

## 2020-10-25 MED ORDER — OXYCODONE HCL 5 MG PO TABS
5.0000 mg | ORAL_TABLET | Freq: Once | ORAL | Status: AC | PRN
  Administered 2020-10-25: 5 mg via ORAL

## 2020-10-25 MED ORDER — ONDANSETRON HCL 4 MG/2ML IJ SOLN
INTRAMUSCULAR | Status: DC | PRN
  Administered 2020-10-25: 4 mg via INTRAVENOUS

## 2020-10-25 MED ORDER — HYDROMORPHONE HCL 1 MG/ML IJ SOLN
0.2500 mg | INTRAMUSCULAR | Status: DC | PRN
  Administered 2020-10-25 (×3): 0.5 mg via INTRAVENOUS

## 2020-10-25 MED ORDER — DEXAMETHASONE SODIUM PHOSPHATE 10 MG/ML IJ SOLN
INTRAMUSCULAR | Status: AC
  Filled 2020-10-25: qty 1

## 2020-10-25 MED ORDER — DEXAMETHASONE SODIUM PHOSPHATE 10 MG/ML IJ SOLN
INTRAMUSCULAR | Status: DC | PRN
  Administered 2020-10-25: 4 mg via INTRAVENOUS

## 2020-10-25 MED ORDER — MIDAZOLAM HCL 2 MG/2ML IJ SOLN
INTRAMUSCULAR | Status: DC | PRN
  Administered 2020-10-25: 2 mg via INTRAVENOUS

## 2020-10-25 MED ORDER — OXYCODONE HCL 5 MG/5ML PO SOLN: 5.0000 mg | Freq: Once | ORAL | Status: AC | PRN

## 2020-10-25 MED ORDER — FENTANYL CITRATE (PF) 100 MCG/2ML IJ SOLN
INTRAMUSCULAR | Status: DC | PRN
  Administered 2020-10-25: 50 ug via INTRAVENOUS

## 2020-10-25 MED ORDER — LACTATED RINGERS IV SOLN: INTRAVENOUS | Status: DC

## 2020-10-25 MED ORDER — CEFAZOLIN SODIUM-DEXTROSE 2-4 GM/100ML-% IV SOLN
2.0000 g | INTRAVENOUS | Status: AC
  Administered 2020-10-25: 2 g via INTRAVENOUS
  Filled 2020-10-25: qty 100

## 2020-10-25 MED ORDER — PROPOFOL 10 MG/ML IV BOLUS
INTRAVENOUS | Status: DC | PRN
  Administered 2020-10-25: 200 mg via INTRAVENOUS

## 2020-10-25 MED ORDER — HYDROMORPHONE HCL 1 MG/ML IJ SOLN
INTRAMUSCULAR | Status: AC
  Administered 2020-10-25: 0.5 mg via INTRAVENOUS
  Filled 2020-10-25: qty 1

## 2020-10-25 MED ORDER — OXYCODONE HCL 5 MG PO TABS
ORAL_TABLET | ORAL | Status: AC
  Filled 2020-10-25: qty 1

## 2020-10-25 MED ORDER — PHENYLEPHRINE 40 MCG/ML (10ML) SYRINGE FOR IV PUSH (FOR BLOOD PRESSURE SUPPORT)
PREFILLED_SYRINGE | INTRAVENOUS | Status: DC | PRN
  Administered 2020-10-25: 120 ug via INTRAVENOUS

## 2020-10-25 MED ORDER — FENTANYL CITRATE (PF) 250 MCG/5ML IJ SOLN
INTRAMUSCULAR | Status: AC
  Filled 2020-10-25: qty 5

## 2020-10-25 MED ORDER — ORAL CARE MOUTH RINSE: 15.0000 mL | Freq: Once | OROMUCOSAL | Status: AC

## 2020-10-25 MED ORDER — LIDOCAINE 2% (20 MG/ML) 5 ML SYRINGE
INTRAMUSCULAR | Status: AC
  Filled 2020-10-25: qty 5

## 2020-10-25 MED ORDER — MEPERIDINE HCL 25 MG/ML IJ SOLN: 6.2500 mg | INTRAMUSCULAR | Status: DC | PRN

## 2020-10-25 MED ORDER — HYDROMORPHONE HCL 1 MG/ML IJ SOLN
INTRAMUSCULAR | Status: AC
  Filled 2020-10-25: qty 1

## 2020-10-25 MED ORDER — MIDAZOLAM HCL 2 MG/2ML IJ SOLN
INTRAMUSCULAR | Status: AC
  Filled 2020-10-25: qty 2

## 2020-10-25 SURGICAL SUPPLY — 33 items
BLADE SAW RECIP 87.9 MT (BLADE) ×3 IMPLANT
BLADE SURG 21 STRL SS (BLADE) ×3 IMPLANT
CANISTER WOUND CARE 500ML ATS (WOUND CARE) IMPLANT
COVER SURGICAL LIGHT HANDLE (MISCELLANEOUS) ×3 IMPLANT
COVER WAND RF STERILE (DRAPES) IMPLANT
DRAPE DERMATAC (DRAPES) ×3 IMPLANT
DRAPE EXTREMITY T 121X128X90 (DISPOSABLE) ×3 IMPLANT
DRAPE HALF SHEET 40X57 (DRAPES) ×3 IMPLANT
DRAPE INCISE IOBAN 66X45 STRL (DRAPES) ×3 IMPLANT
DRAPE U-SHAPE 47X51 STRL (DRAPES) ×6 IMPLANT
DRESSING PREVENA PLUS CUSTOM (GAUZE/BANDAGES/DRESSINGS) ×1 IMPLANT
DRSG PREVENA PLUS CUSTOM (GAUZE/BANDAGES/DRESSINGS) ×3
DURAPREP 26ML APPLICATOR (WOUND CARE) ×3 IMPLANT
ELECT REM PT RETURN 9FT ADLT (ELECTROSURGICAL) ×3
ELECTRODE REM PT RTRN 9FT ADLT (ELECTROSURGICAL) ×1 IMPLANT
GLOVE BIOGEL PI IND STRL 9 (GLOVE) ×1 IMPLANT
GLOVE BIOGEL PI INDICATOR 9 (GLOVE) ×2
GLOVE SURG ORTHO 9.0 STRL STRW (GLOVE) ×3 IMPLANT
GOWN STRL REUS W/ TWL XL LVL3 (GOWN DISPOSABLE) ×3 IMPLANT
GOWN STRL REUS W/TWL XL LVL3 (GOWN DISPOSABLE) ×6
KIT BASIN OR (CUSTOM PROCEDURE TRAY) ×3 IMPLANT
KIT PUMP PREVENA PLUS 14DAY (MISCELLANEOUS) ×3 IMPLANT
KIT TURNOVER KIT B (KITS) ×3 IMPLANT
MANIFOLD NEPTUNE II (INSTRUMENTS) ×3 IMPLANT
NS IRRIG 1000ML POUR BTL (IV SOLUTION) ×3 IMPLANT
PACK GENERAL/GYN (CUSTOM PROCEDURE TRAY) ×3 IMPLANT
PAD ARMBOARD 7.5X6 YLW CONV (MISCELLANEOUS) ×3 IMPLANT
PREVENA RESTOR ARTHOFORM 46X30 (CANNISTER) ×3 IMPLANT
STAPLER VISISTAT 35W (STAPLE) ×3 IMPLANT
SUT ETHILON 2 0 PSLX (SUTURE) ×6 IMPLANT
SUT SILK 2 0 (SUTURE)
SUT SILK 2-0 18XBRD TIE 12 (SUTURE) IMPLANT
TOWEL GREEN STERILE (TOWEL DISPOSABLE) ×3 IMPLANT

## 2020-10-25 NOTE — Anesthesia Procedure Notes (Signed)
Procedure Name: LMA Insertion Date/Time: 10/25/2020 8:39 AM Performed by: Epifanio Lesches, CRNA Pre-anesthesia Checklist: Patient identified, Emergency Drugs available, Suction available and Patient being monitored Patient Re-evaluated:Patient Re-evaluated prior to induction Oxygen Delivery Method: Circle System Utilized Preoxygenation: Pre-oxygenation with 100% oxygen Induction Type: IV induction Ventilation: Mask ventilation without difficulty LMA: LMA inserted LMA Size: 4.0 Number of attempts: 1 Airway Equipment and Method: Bite block Placement Confirmation: positive ETCO2 Tube secured with: Tape Dental Injury: Teeth and Oropharynx as per pre-operative assessment

## 2020-10-25 NOTE — Transfer of Care (Signed)
Immediate Anesthesia Transfer of Care Note  Patient: Wesley Fuentes  Procedure(s) Performed: REVISION RIGHT BELOW KNEE AMPUTATION (Right Knee)  Patient Location: PACU  Anesthesia Type:General  Level of Consciousness: awake and drowsy  Airway & Oxygen Therapy: Patient Spontanous Breathing and Patient connected to nasal cannula oxygen  Post-op Assessment: Report given to RN and Post -op Vital signs reviewed and stable  Post vital signs: Reviewed and stable  Last Vitals:  Vitals Value Taken Time  BP 99/66 10/25/20 0915  Temp    Pulse 69 10/25/20 0917  Resp 5 10/25/20 0917  SpO2 100 % 10/25/20 0917  Vitals shown include unvalidated device data.  Last Pain:  Vitals:   10/25/20 0741  PainSc: 0-No pain         Complications: No complications documented.

## 2020-10-25 NOTE — Telephone Encounter (Signed)
Rx sent 

## 2020-10-25 NOTE — Op Note (Signed)
10/25/2020  9:26 AM  PATIENT:  Wesley Fuentes    PRE-OPERATIVE DIAGNOSIS:  Abscess Right Below Knee Amputation  POST-OPERATIVE DIAGNOSIS:  Same  PROCEDURE:  REVISION RIGHT BELOW KNEE AMPUTATION  SURGEON:  [name], MD  PHYSICIAN ASSISTANT:None ANESTHESIA:   General  PREOPERATIVE INDICATIONS:  Wesley Fuentes is a  44 y.o. male with a diagnosis of Abscess Right Below Knee Amputation who failed conservative measures and elected for surgical management.    The risks benefits and alternatives were discussed with the patient preoperatively including but not limited to the risks of infection, bleeding, nerve injury, cardiopulmonary complications, the need for revision surgery, among others, and the patient was willing to proceed.  OPERATIVE IMPLANTS: Praveena customizable and Arthur form wound VAC  @ENCIMAGES @  OPERATIVE FINDINGS: No deep abscess good petechial bleeding ulcer extending down to bone.  OPERATIVE PROCEDURE: Patient was brought the operating underwent a general anesthetic.  After adequate levels anesthesia were obtained patient's right lower extremity was prepped using DuraPrep draped into a sterile field a timeout was called.  A fishmouth incision was made around the ulcerative tissue this was carried sharply down to bone the distal 2 cm of tibia and fibula were resected beveled anteriorly.  Electrocautery was used for hemostasis there was good petechial bleeding no abscess at the surgical margins no bone infection at the surgical margins the wound was irrigated with normal saline the deep and superficial fascia layers and skin was closed using 2-0 nylon.  A customizable and form wound VAC was applied this had a good suction fit with the Praveena plus a portable pump.  Patient was extubated taken the PACU in stable condition.   DISCHARGE PLANNING:  Antibiotic duration: Preoperative antibiotics  Weightbearing: Nonweightbearing on the right  Pain medication:  Patient will continue his methadone  Dressing care/ Wound VAC: Discharge with wound VAC  Ambulatory devices: Is continue current prosthetic on the left  Discharge to: Home.  Follow-up: In the office 1 week post operative.

## 2020-10-25 NOTE — Telephone Encounter (Signed)
Pt is s/p a BKA today. Please see message below.

## 2020-10-25 NOTE — Telephone Encounter (Signed)
Patient's wife Britta Mccreedy called for prescription of percocits for patient. Please send to [facility] on [address] [place]. Patient phone number is [phone].

## 2020-10-25 NOTE — H&P (Signed)
Wesley Fuentes is an 44 y.o. male.   Chief Complaint: Suture abscess Right Below Knee Amputation HPI: Patient presents in follow-up today from his suture abscess on his right below-knee amputation stump.  This was explored in the office at his last visit.  While he says it is gotten a little bit better he still has had now 6 weeks of pinpoint purulent drainage from the area.  He has also taken antibiotics.  He is wondering what the next step is  Past Medical History:  Diagnosis Date  . CHB (complete heart block) ([address])   . Depression 12/11/2017  . Endocarditis of tricuspid valve   . Hepatitis C   . History of complete heart block 12/11/2017  . 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 MRSA (methicillin resistant Staphylococcus aureus)   . Opioid use disorder, moderate, in early remission ([place]) 12/11/2017  . Presence of permanent cardiac pacemaker    MedTronic    Past Surgical History:  Procedure Laterality Date  . BELOW KNEE LEG AMPUTATION Bilateral   . PACEMAKER REVISION     moved from chest to abd.   . Permanent pacemaker placement     On 11/24/17-had [facility] Jude dual-chamber permanent pacemaker placed.  . right hand amputation    . Tricuspid valve replacement     On 11/17/17-replacement of tricuspid valve with a 29 mm Medtronic Mosaic mitral prosthesis.    Family History  Problem Relation Age of Onset  . Diabetes Maternal Grandmother   . Diabetes Maternal Grandfather   . Cancer Maternal Grandfather        oral  . Cervical cancer Mother    Social History:  reports that he has quit smoking. He has never used smokeless tobacco. He reports previous alcohol use. He reports previous drug use. Drug: Other-see comments.  Allergies: No Known Allergies  Medications Prior to Admission  Medication Sig Dispense Refill  . Acetaminophen 325 MG CAPS Take 650 mg by mouth every 6 (six) hours as needed (pain).     . Ascorbic Acid (VITAMIN C WITH ROSE  HIPS) 500 MG tablet Take 1 tablet (500 mg total) by mouth daily. 30 tablet 1  . aspirin EC 81 MG tablet Take 81 mg by mouth daily. Swallow whole.    . bisacodyl (BISACODYL) 5 MG EC tablet Take 5 mg by mouth 2 (two) times daily as needed for moderate constipation.    . chlorhexidine (PERIDEX) 0.12 % solution Use as directed 15 mLs in the mouth or throat 2 (two) times daily.     . famotidine (PEPCID) 20 MG tablet Take 20 mg by mouth 2 (two) times daily as needed for heartburn or indigestion.    . methadone (DOLOPHINE) 10 MG/ML solution Take 70 mg by mouth daily.    . minocycline (DYNACIN) 100 MG tablet Take 1 tablet (100 mg total) by mouth 2 (two) times daily. 180 tablet 3  . polyethylene glycol (MIRALAX / GLYCOLAX) 17 g packet Take 17 g by mouth daily.    . sertraline (ZOLOFT) 100 MG tablet Take 2 tablets (200 mg total) by mouth daily. (Patient taking differently: Take 100 mg by mouth daily. ) 135 tablet 1  . mupirocin ointment (BACTROBAN) 2 % Apply 1 application topically 2 (two) times daily. (Patient not taking: Reported on 10/23/2020) 22 g 0    Results for orders placed or performed during the hospital encounter of 10/23/20 (from the past 48 hour(s))  SARS CORONAVIRUS 2 (TAT 6-24 HRS)  Nasopharyngeal Nasopharyngeal Swab     Status: None   Collection Time: 10/23/20  2:26 PM   Specimen: Nasopharyngeal Swab  Result Value Ref Range   SARS Coronavirus 2 NEGATIVE NEGATIVE    Comment: (NOTE) SARS-CoV-2 target nucleic acids are NOT DETECTED.  The SARS-CoV-2 RNA is generally detectable in upper and lower respiratory specimens during the acute phase of infection. Negative results do not preclude SARS-CoV-2 infection, do not rule out co-infections with other pathogens, and should not be used as the sole basis for treatment or other patient management decisions. Negative results must be combined with clinical observations, patient history, and epidemiological information. The expected result is  Negative.  Fact Sheet for Patients: SugarRoll.be  Fact Sheet for Healthcare Providers: https://www.woods-mathews.com/  This test is not yet approved or cleared by the [place] FDA and  has been authorized for detection and/or diagnosis of SARS-CoV-2 by FDA under an Emergency Use Authorization (EUA). This EUA will remain  in effect (meaning this test can be used) for the duration of the COVID-19 declaration under Se ction 564(b)(1) of the Act, 21 U.S.C. section 360bbb-3(b)(1), unless the authorization is terminated or revoked sooner.  Performed at [facility], [place] [address], [place], [place] [address]    No results found.  Review of Systems  All other systems reviewed and are negative.   There were no vitals taken for this visit. Physical Exam  Patient is alert, oriented, no adenopathy, well-dressed, normal affect, normal respiratory effort. Right below-knee amputations dump no cellulitis.  He does have a pinpoint area on the anterior tibia that expresses some purulent fluid with surrounding scar tissue.  No foul odor.  It is tender to palpation over the scar tissue.  Heart regular rate and rhythm lungs clear Assessment/Plan Plan: Dr. Sharol Given met with the patient directly recommended a revision below-knee amputation.  We will plan for this in the next week or so  [place], PA 10/25/2020, 6:37 AM

## 2020-10-26 ENCOUNTER — Encounter (HOSPITAL_COMMUNITY): Payer: Self-pay | Admitting: Orthopedic Surgery

## 2020-10-26 ENCOUNTER — Telehealth: Payer: Self-pay

## 2020-10-26 NOTE — Telephone Encounter (Signed)
Pt's mother called the on call service with c/o pt's wound vac alarming. I called and she advised that it is not making any noise now and all the lights are on there is no indication of a leak or improper seal advised to call the office if this should happen again and we can get the pt in the office for eval today. To call and ask for me specifically if she should have any trouble at all.

## 2020-10-26 NOTE — Anesthesia Postprocedure Evaluation (Signed)
Anesthesia Post Note  Patient: Wesley Fuentes  Procedure(s) Performed: REVISION RIGHT BELOW KNEE AMPUTATION (Right Knee)     Patient location during evaluation: PACU Anesthesia Type: General Level of consciousness: sedated and patient cooperative Pain management: pain level controlled Vital Signs Assessment: post-procedure vital signs reviewed and stable Respiratory status: spontaneous breathing Cardiovascular status: stable Anesthetic complications: no   No complications documented.  Last Vitals:  Vitals:   10/25/20 1015 10/25/20 1025  BP: (!) 134/91   Pulse: 69 68  Resp: 16 (!) 24  Temp:    SpO2: 99% 98%    Last Pain:  Vitals:   10/25/20 1025  PainSc: 8                  Kin Galbraith [employer]

## 2020-10-31 ENCOUNTER — Encounter: Payer: Medicaid Other | Admitting: Occupational Therapy

## 2020-11-01 ENCOUNTER — Ambulatory Visit (INDEPENDENT_AMBULATORY_CARE_PROVIDER_SITE_OTHER): Payer: Medicaid Other | Admitting: Physician Assistant

## 2020-11-01 ENCOUNTER — Encounter: Payer: Self-pay | Admitting: Physician Assistant

## 2020-11-01 VITALS — Ht 70.0 in | Wt 145.0 lb

## 2020-11-01 DIAGNOSIS — M86461 Chronic osteomyelitis with draining sinus, right tibia and fibula: Secondary | ICD-10-CM

## 2020-11-01 NOTE — Progress Notes (Signed)
Office Visit Note   Patient: Wesley Fuentes           Date of Birth: 04-Feb-1976           MRN: [id number] Visit Date: 11/01/2020              Requested by: [name], DO [address] [place],  [place] [address] PCP: [name], DO  Chief Complaint  Patient presents with  . Right Leg - Routine Post Op    10/25/20 revision right BKA  D/C'd vac today less than 25 cc in container. Photo obtained.       HPI: Patient is 1 week status post revision right below-knee amputation he does take aspirin.  Wound VAC is being removed today.  He does not have any vive shrinkers  Assessment & Plan: Visit Diagnoses: No diagnosis found.  Plan: Pressure wrapping will be applied today.  Have given them prescription to get some shrinkers.  Should do daily dressing changes and cleansing.  Elevate leg.  Follow-up in 1 week.  Follow-Up Instructions: No follow-ups on file.   Ortho Exam  Patient is alert, oriented, no adenopathy, well-dressed, normal affect, normal respiratory effort. Examination mild to moderate soft tissue swelling but no cellulitis well apposed wound edges without any necrosis.  He does have some bleeding.  This was after the wound VAC was removed.  No cellulitis signs of infection no wound dehiscence sutures in place  Imaging: No results found.    Labs: No results found for: HGBA1C, ESRSEDRATE, CRP, LABURIC, REPTSTATUS, GRAMSTAIN, CULT, LABORGA   Lab Results  Component Value Date   ALBUMIN 3.9 10/25/2020   ALBUMIN 4.3 06/20/2020   ALBUMIN 5.0 12/17/2017    No results found for: MG No results found for: VD25OH  No results found for: PREALBUMIN CBC EXTENDED Latest Ref Rng & Units 10/25/2020 06/20/2020 12/17/2017  WBC 4.0 - 10.5 K/uL 4.4 4.4 7.1  RBC 4.22 - 5.81 MIL/uL 5.55 5.20 5.55  HGB 13.0 - 17.0 g/dL 27.6 11.3(L) 14.8  HCT 39 - 52 % 43.0 38.4 45.9  PLT 150 - 400 K/uL 156 188 257     Body mass index is 20.81 kg/m.  Orders:  No orders of the  defined types were placed in this encounter.  No orders of the defined types were placed in this encounter.    Procedures: No procedures performed  Clinical Data: No additional findings.  ROS:  All other systems negative, except as noted in the HPI. Review of Systems  Objective: Vital Signs: Ht 5\' 10"  (1.778 m)   Wt 145 lb (65.8 kg)   BMI 20.81 kg/m   Specialty Comments:  No specialty comments available.  PMFS History: Patient Active Problem List   Diagnosis Date Noted  . 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 Dehiscence of amputation stump (HCC)   . Pacemaker - MDT 09/09/2020  . Wound of left leg 08/24/2020  . Fatigue 07/31/2020  . 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 Educated about COVID-19 virus infection 05/11/2020  . S/P bilateral below knee amputation (HCC) 05/09/2020  . 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 History of bacterial endocarditis 05/02/2020  . 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 Complete heart block (HCC) 12/11/2017  . 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 Opioid use disorder, moderate, in early remission (HCC) 12/11/2017  . Depression 12/11/2017   Past Medical History:  Diagnosis  Date  . CHB (complete heart block) (HCC)   . Depression 12/11/2017  . Endocarditis of tricuspid valve   . Hepatitis C   . History of complete heart block 12/11/2017  . 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 MRSA (methicillin resistant Staphylococcus aureus)   . Opioid use disorder, moderate, in early remission (HCC) 12/11/2017  . Presence of permanent cardiac pacemaker    MedTronic    Family History  Problem Relation Age of Onset  . Diabetes Maternal Grandmother   . Diabetes Maternal Grandfather   . Cancer Maternal Grandfather        oral  . Cervical cancer Mother     Past Surgical History:  Procedure Laterality Date  .  BELOW KNEE LEG AMPUTATION Bilateral   . PACEMAKER REVISION     moved from chest to abd.   . Permanent pacemaker placement     On 11/24/17-had [facility] Jude dual-chamber permanent pacemaker placed.  . right hand amputation    . STUMP REVISION Right 10/25/2020   Procedure: REVISION RIGHT BELOW KNEE AMPUTATION;  Surgeon: [name], MD;  Location: [facility] OR;  Service: Orthopedics;  Laterality: Right;  . Tricuspid valve replacement     On 11/17/17-replacement of tricuspid valve with a 29 mm Medtronic Mosaic mitral prosthesis.   Social History   Occupational History  . Not on file  Tobacco Use  . Smoking status: Former Games developer  . Smokeless tobacco: Never Used  Vaping Use  . Vaping Use: Former  Substance and Sexual Activity  . Alcohol use: Not Currently  . Drug use: Not Currently    Types: Other-see comments    Comment: on methadone; former user  . Sexual activity: Not on file

## 2020-11-07 ENCOUNTER — Encounter: Payer: Medicaid Other | Admitting: Occupational Therapy

## 2020-11-08 ENCOUNTER — Encounter: Payer: Self-pay | Admitting: Physician Assistant

## 2020-11-08 ENCOUNTER — Ambulatory Visit: Payer: Medicaid Other | Admitting: Physician Assistant

## 2020-11-08 ENCOUNTER — Ambulatory Visit (INDEPENDENT_AMBULATORY_CARE_PROVIDER_SITE_OTHER): Payer: Medicaid Other | Admitting: Physician Assistant

## 2020-11-08 DIAGNOSIS — M86461 Chronic osteomyelitis with draining sinus, right tibia and fibula: Secondary | ICD-10-CM

## 2020-11-08 NOTE — Progress Notes (Signed)
Office Visit Note   Patient: Wesley Fuentes           Date of Birth: June 12, 1976           MRN: [id number] Visit Date: 11/08/2020              Requested by: [name], DO [address] [place],  [place] [address] PCP: [name], DO  No chief complaint on file.     HPI: Patient presents today 2 weeks status post below-knee amputation revision on the right.  He does not have any complaints.  He does have a small amount of concerned because he seems to have drainage from the same area as before  Assessment & Plan: Visit Diagnoses: No diagnosis found.  Plan: I placed a shrinker on the patient's wound.  Told him it was important to have this on directly on the wound for healing purposes.  They may change it daily.  Also have emphasized the importance of working on full leg extension as he does have a contracture.  Follow-up 1 week  Follow-Up Instructions: No follow-ups on file.   Ortho Exam  Patient is alert, oriented, no adenopathy, well-dressed, normal affect, normal respiratory effort. Examination of the amputation stump mild to moderate amount of soft tissue swelling surgical sutures are in place some minimal bloody drainage.  He is lacking about 10 degrees of full extension of his knee.  No cellulitis or signs of infection  Imaging: No results found. No images are attached to the encounter.  Labs: No results found for: HGBA1C, ESRSEDRATE, CRP, LABURIC, REPTSTATUS, GRAMSTAIN, CULT, LABORGA   Lab Results  Component Value Date   ALBUMIN 3.9 10/25/2020   ALBUMIN 4.3 06/20/2020   ALBUMIN 5.0 12/17/2017    No results found for: MG No results found for: VD25OH  No results found for: PREALBUMIN CBC EXTENDED Latest Ref Rng & Units 10/25/2020 06/20/2020 12/17/2017  WBC 4.0 - 10.5 K/uL 4.4 4.4 7.1  RBC 4.22 - 5.81 MIL/uL 5.55 5.20 5.55  HGB 13.0 - 17.0 g/dL 00.7 11.3(L) 14.8  HCT 39 - 52 % 43.0 38.4 45.9  PLT 150 - 400 K/uL 156 188 257     There is no height  or weight on file to calculate BMI.  Orders:  No orders of the defined types were placed in this encounter.  No orders of the defined types were placed in this encounter.    Procedures: No procedures performed  Clinical Data: No additional findings.  ROS:  All other systems negative, except as noted in the HPI. Review of Systems  Objective: Vital Signs: There were no vitals taken for this visit.  Specialty Comments:  No specialty comments available.  PMFS History: Patient Active Problem List   Diagnosis Date Noted  . 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 Dehiscence of amputation stump (HCC)   . Pacemaker - MDT 09/09/2020  . Wound of left leg 08/24/2020  . Fatigue 07/31/2020  . 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 Educated about COVID-19 virus infection 05/11/2020  . S/P bilateral below knee amputation (HCC) 05/09/2020  . 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 History of bacterial endocarditis 05/02/2020  . 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 Complete heart block (HCC) 12/11/2017  . 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 Opioid use disorder, moderate, in early remission (HCC) 12/11/2017  . Depression 12/11/2017   Past Medical History:  Diagnosis Date  . CHB (complete heart block) (HCC)   . Depression 12/11/2017  . Endocarditis of tricuspid valve   . Hepatitis C   . History of complete heart block 12/11/2017  . 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 MRSA (methicillin resistant Staphylococcus aureus)   . Opioid use disorder, moderate, in early remission (HCC) 12/11/2017  . Presence of permanent cardiac pacemaker    [employer]    Family History  Problem Relation Age of Onset  . Diabetes Maternal Grandmother   . Diabetes Maternal Grandfather   . Cancer Maternal Grandfather        oral  . Cervical cancer Mother     Past  Surgical History:  Procedure Laterality Date  . BELOW KNEE LEG AMPUTATION Bilateral   . PACEMAKER REVISION     moved from chest to abd.   . Permanent pacemaker placement     On 11/24/17-had [facility] Jude dual-chamber permanent pacemaker placed.  . right hand amputation    . STUMP REVISION Right 10/25/2020   Procedure: REVISION RIGHT BELOW KNEE AMPUTATION;  Surgeon: [name], MD;  Location: [facility] OR;  Service: Orthopedics;  Laterality: Right;  . Tricuspid valve replacement     On 11/17/17-replacement of tricuspid valve with a 29 mm Medtronic Mosaic mitral prosthesis.   Social History   Occupational History  . Not on file  Tobacco Use  . Smoking status: Former Games developer  . Smokeless tobacco: Never Used  Vaping Use  . Vaping Use: Former  Substance and Sexual Activity  . Alcohol use: Not Currently  . Drug use: Not Currently    Types: Other-see comments    Comment: on methadone; former user  . Sexual activity: Not on file

## 2020-11-13 ENCOUNTER — Encounter: Payer: Self-pay | Admitting: Orthopedic Surgery

## 2020-11-13 ENCOUNTER — Ambulatory Visit (INDEPENDENT_AMBULATORY_CARE_PROVIDER_SITE_OTHER): Payer: Medicaid Other | Admitting: Physician Assistant

## 2020-11-13 DIAGNOSIS — M86461 Chronic osteomyelitis with draining sinus, right tibia and fibula: Secondary | ICD-10-CM

## 2020-11-13 MED ORDER — SULFAMETHOXAZOLE-TRIMETHOPRIM 800-160 MG PO TABS: 1.0000 | ORAL_TABLET | Freq: Two times a day (BID) | ORAL | 0 refills | Status: DC

## 2020-11-13 NOTE — Progress Notes (Signed)
Office Visit Note   Patient: Wesley Fuentes           Date of Birth: 29-May-1976           MRN: [id number] Visit Date: 11/13/2020              Requested by: [name], DO [address] [place],  [place] [address] PCP: [name], DO  Chief Complaint  Patient presents with  . Right Knee - Pain, Follow-up      HPI: This is a pleasant gentleman who is now 2 weeks 5 days status post revision right below-knee amputation.  He comes in today because he and his mother are concerned that he seems to be having more pussy drainage especially from the central portion of the wound and some dehiscence of the wound.  He denies any fever or chills  Assessment & Plan: Visit Diagnoses: No diagnosis found.  Plan: Patient was seen by Dr. Lajoyce Corners.  We do have some concerns that he might be in the early stages of infection.  We will place him on oral antibiotics and see him back in a week but sooner if he notices any increased drainage or increased pain  Follow-Up Instructions: No follow-ups on file.   Ortho Exam  Patient is alert, oriented, no adenopathy, well-dressed, normal affect, normal respiratory effort. Surgical incision overall on the periphery is well-healed.  In the central portion there is an area of some wound dehiscence and clotting.  This was explored and a large amount of dark blood and clot was expressed.  There is no cellulitis no foul odor.  Surgical sutures will be harvested today  Imaging: No results found. No images are attached to the encounter.  Labs: No results found for: HGBA1C, ESRSEDRATE, CRP, LABURIC, REPTSTATUS, GRAMSTAIN, CULT, LABORGA   Lab Results  Component Value Date   ALBUMIN 3.9 10/25/2020   ALBUMIN 4.3 06/20/2020   ALBUMIN 5.0 12/17/2017    No results found for: MG No results found for: VD25OH  No results found for: PREALBUMIN CBC EXTENDED Latest Ref Rng & Units 10/25/2020 06/20/2020 12/17/2017  WBC 4.0 - 10.5 K/uL 4.4 4.4 7.1  RBC 4.22 -  5.81 MIL/uL 5.55 5.20 5.55  HGB 13.0 - 17.0 g/dL 96.7 11.3(L) 14.8  HCT 39 - 52 % 43.0 38.4 45.9  PLT 150 - 400 K/uL 156 188 257     There is no height or weight on file to calculate BMI.  Orders:  No orders of the defined types were placed in this encounter.  No orders of the defined types were placed in this encounter.    Procedures: No procedures performed  Clinical Data: No additional findings.  ROS:  All other systems negative, except as noted in the HPI. Review of Systems  Objective: Vital Signs: There were no vitals taken for this visit.  Specialty Comments:  No specialty comments available.  PMFS History: Patient Active Problem List   Diagnosis Date Noted  . 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 Dehiscence of amputation stump (HCC)   . Pacemaker - MDT 09/09/2020  . Wound of left leg 08/24/2020  . Fatigue 07/31/2020  . 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 Educated about COVID-19 virus infection 05/11/2020  . S/P bilateral below knee amputation (HCC) 05/09/2020  . 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 History of bacterial endocarditis 05/02/2020  . 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 Complete heart  block (HCC) 12/11/2017  . 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 Opioid use disorder, moderate, in early remission (HCC) 12/11/2017  . Depression 12/11/2017   Past Medical History:  Diagnosis Date  . CHB (complete heart block) (HCC)   . Depression 12/11/2017  . Endocarditis of tricuspid valve   . Hepatitis C   . History of complete heart block 12/11/2017  . 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 MRSA (methicillin resistant Staphylococcus aureus)   . Opioid use disorder, moderate, in early remission (HCC) 12/11/2017  . Presence of permanent cardiac pacemaker    [employer]    Family History  Problem Relation Age of  Onset  . Diabetes Maternal Grandmother   . Diabetes Maternal Grandfather   . Cancer Maternal Grandfather        oral  . Cervical cancer Mother     Past Surgical History:  Procedure Laterality Date  . BELOW KNEE LEG AMPUTATION Bilateral   . PACEMAKER REVISION     moved from chest to abd.   . Permanent pacemaker placement     On 11/24/17-had [facility] Jude dual-chamber permanent pacemaker placed.  . right hand amputation    . STUMP REVISION Right 10/25/2020   Procedure: REVISION RIGHT BELOW KNEE AMPUTATION;  Surgeon: [name], MD;  Location: [facility] OR;  Service: Orthopedics;  Laterality: Right;  . Tricuspid valve replacement     On 11/17/17-replacement of tricuspid valve with a 29 mm Medtronic Mosaic mitral prosthesis.   Social History   Occupational History  . Not on file  Tobacco Use  . Smoking status: Former Games developer  . Smokeless tobacco: Never Used  Vaping Use  . Vaping Use: Former  Substance and Sexual Activity  . Alcohol use: Not Currently  . Drug use: Not Currently    Types: Other-see comments    Comment: on methadone; former user  . Sexual activity: Not on file

## 2020-11-14 ENCOUNTER — Encounter: Payer: Medicaid Other | Admitting: Occupational Therapy

## 2020-11-15 ENCOUNTER — Ambulatory Visit: Payer: Medicaid Other | Admitting: Physician Assistant

## 2020-11-20 ENCOUNTER — Ambulatory Visit (INDEPENDENT_AMBULATORY_CARE_PROVIDER_SITE_OTHER): Payer: Medicaid Other | Admitting: Orthopedic Surgery

## 2020-11-20 ENCOUNTER — Encounter: Payer: Self-pay | Admitting: Orthopedic Surgery

## 2020-11-20 VITALS — Ht 70.0 in | Wt 145.0 lb

## 2020-11-20 DIAGNOSIS — Z89511 Acquired absence of right leg below knee: Secondary | ICD-10-CM

## 2020-11-20 DIAGNOSIS — Z89512 Acquired absence of left leg below knee: Secondary | ICD-10-CM

## 2020-11-27 ENCOUNTER — Encounter: Payer: Self-pay | Admitting: Orthopedic Surgery

## 2020-11-27 NOTE — Progress Notes (Signed)
Office Visit Note   Patient: Wesley Fuentes           Date of Birth: 1976-04-05           MRN: [id number] Visit Date: 11/20/2020              Requested by: [name], DO [address] [place],  [place] [address] PCP: [name], DO  Chief Complaint  Patient presents with  . Right Leg - Routine Post Op    11/25/20 revision right BKA       HPI: Patient is a 44 year old gentleman who is 1 month status post revision right transtibial amputation is currently on Bactrim DS twice a day he denies any drainage he is not using the dressing at this time patient states that his limb feels cold does have a stump shrinker.  Assessment & Plan: Visit Diagnoses:  1. S/P bilateral below knee amputation (HCC)     Plan: Recommend to continue with the stump shrinker follow-up with the prosthetists and follow-up in the office in 4 weeks  Follow-Up Instructions: Return in about 4 weeks (around 12/18/2020).   Ortho Exam  Patient is alert, oriented, no adenopathy, well-dressed, normal affect, normal respiratory effort. Examination the limbs are both cold the right transtibial amputation is healed and it is cold to touch there is good hair growth capillary refill less than 2 seconds.  No ischemic changes  Imaging: No results found. No images are attached to the encounter.  Labs: No results found for: HGBA1C, ESRSEDRATE, CRP, LABURIC, REPTSTATUS, GRAMSTAIN, CULT, LABORGA   Lab Results  Component Value Date   ALBUMIN 3.9 10/25/2020   ALBUMIN 4.3 06/20/2020   ALBUMIN 5.0 12/17/2017    No results found for: MG No results found for: VD25OH  No results found for: PREALBUMIN CBC EXTENDED Latest Ref Rng & Units 10/25/2020 06/20/2020 12/17/2017  WBC 4.0 - 10.5 K/uL 4.4 4.4 7.1  RBC 4.22 - 5.81 MIL/uL 5.55 5.20 5.55  HGB 13.0 - 17.0 g/dL 18.2 11.3(L) 14.8  HCT 39 - 52 % 43.0 38.4 45.9  PLT 150 - 400 K/uL 156 188 257     Body mass index is 20.81 kg/m.  Orders:  No orders of  the defined types were placed in this encounter.  No orders of the defined types were placed in this encounter.    Procedures: No procedures performed  Clinical Data: No additional findings.  ROS:  All other systems negative, except as noted in the HPI. Review of Systems  Objective: Vital Signs: Ht 5\' 10"  (1.778 m)   Wt 145 lb (65.8 kg)   BMI 20.81 kg/m   Specialty Comments:  No specialty comments available.  PMFS History: Patient Active Problem List   Diagnosis Date Noted  . 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 Dehiscence of amputation stump (HCC)   . Pacemaker - MDT 09/09/2020  . Wound of left leg 08/24/2020  . Fatigue 07/31/2020  . 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 Educated about COVID-19 virus infection 05/11/2020  . S/P bilateral below knee amputation (HCC) 05/09/2020  . 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 History of bacterial endocarditis 05/02/2020  . 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 Complete heart block (HCC) 12/11/2017  . 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 Opioid use disorder, moderate, in early remission (HCC) 12/11/2017  . Depression 12/11/2017   Past Medical History:  Diagnosis  Date  . CHB (complete heart block) (HCC)   . Depression 12/11/2017  . Endocarditis of tricuspid valve   . Hepatitis C   . History of complete heart block 12/11/2017  . 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 MRSA (methicillin resistant Staphylococcus aureus)   . Opioid use disorder, moderate, in early remission (HCC) 12/11/2017  . Presence of permanent cardiac pacemaker    [employer]    Family History  Problem Relation Age of Onset  . Diabetes Maternal Grandmother   . Diabetes Maternal Grandfather   . Cancer Maternal Grandfather        oral  . Cervical cancer Mother     Past Surgical History:  Procedure Laterality Date   . BELOW KNEE LEG AMPUTATION Bilateral   . PACEMAKER REVISION     moved from chest to abd.   . Permanent pacemaker placement     On 11/24/17-had [facility] Jude dual-chamber permanent pacemaker placed.  . right hand amputation    . STUMP REVISION Right 10/25/2020   Procedure: REVISION RIGHT BELOW KNEE AMPUTATION;  Surgeon: [name], MD;  Location: [facility] OR;  Service: Orthopedics;  Laterality: Right;  . Tricuspid valve replacement     On 11/17/17-replacement of tricuspid valve with a 29 mm Medtronic Mosaic mitral prosthesis.   Social History   Occupational History  . Not on file  Tobacco Use  . Smoking status: Former Games developer  . Smokeless tobacco: Never Used  Vaping Use  . Vaping Use: Former  Substance and Sexual Activity  . Alcohol use: Not Currently  . Drug use: Not Currently    Types: Other-see comments    Comment: on methadone; former user  . Sexual activity: Not on file

## 2020-12-06 ENCOUNTER — Ambulatory Visit (INDEPENDENT_AMBULATORY_CARE_PROVIDER_SITE_OTHER): Payer: Medicaid Other

## 2020-12-06 DIAGNOSIS — I442 Atrioventricular block, complete: Secondary | ICD-10-CM

## 2020-12-06 LAB — CUP PACEART REMOTE DEVICE CHECK
Battery Remaining Longevity: 155 mo
Battery Voltage: 3.11 V
Brady Statistic RV Percent Paced: 99.89 %
Date Time Interrogation Session: 20211207225110
Implantable Lead Implant Date: 20210308
Implantable Lead Location: 753862
Implantable Lead Model: 4968
Implantable Pulse Generator Implant Date: 20210308
Lead Channel Impedance Value: 551 Ohm
Lead Channel Impedance Value: 741 Ohm
Lead Channel Pacing Threshold Amplitude: 1.125 V
Lead Channel Pacing Threshold Pulse Width: 0.4 ms
Lead Channel Sensing Intrinsic Amplitude: 7.625 mV
Lead Channel Sensing Intrinsic Amplitude: 7.625 mV
Lead Channel Setting Pacing Amplitude: 2.25 V
Lead Channel Setting Pacing Pulse Width: 0.4 ms
Lead Channel Setting Sensing Sensitivity: 1.2 mV

## 2020-12-13 NOTE — Progress Notes (Signed)
Cardiology Office Note   Date:  12/15/2020   ID:  Wesley Fuentes, DOB [birth year], MRN [id number]  PCP:  [name], DO  Cardiologist:   Rollene Rotunda, MD   Chief Complaint  Patient presents with  . TRICUSPID STENOSIS      History of Present Illness: Wesley Fuentes is a 44 y.o. male who was referred by [name], DO for follow up of tricuspid valve replacement and pacemaker placement.     He presented for follow up with a history of SBE.  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     I saw him in 2018 after he moved from  [place].  He has a history of IV drug abuse and he developed meth resistant staph aureus. He had septic emboli or vegetation on the tricuspid valve but no other valvular involvement.  He underwent valve replacement with a 27 mm Mosaic prosthetic tricuspic valve.  Postop he had complete heart block that did not resolve so he underwent [facility]  pacemaker placement.  He had moved to [place], while there unfortunately relapsed back to heroine. He was hospitalized in August 2020 with endocarditis, complicated with septic emboli to bilateral lower and right upper extremities, lungs and spleen.  He had DIC and AKI.  He had bilateral LE and RUE amputations, he had multiple other complications.  He developed a right IJ DVT.  He had a post op hematoma.  He had PPM site errosion with MRSA.  He underwent pacer extraction and reimplantation at an abdominal site.  Other complications included tricuspid regurgitation (not thought to be a operative candidate) and subsequent ascites requiring paracentesis.    I sent him for an abdominal ultrasound which did not demonstrate ascites.  He had an echo in June.  He had an echo which I reviewed.  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  Since I saw him he had revision of his right leg amputation.   He  does have a prosthesis for his left leg which he uses sometimes. He is getting around mostly in an electric wheelchair.  The patient denies any new symptoms such as chest discomfort, neck or arm discomfort. There has been no new shortness of breath, PND or orthopnea. There have been no reported palpitations, presyncope or syncope. He has no abdominal distension. He has no chest pressure, neck or arm discomfort.     Past Medical History:  Diagnosis Date  . CHB (complete heart block) (HCC)   . Depression 12/11/2017  . Endocarditis of tricuspid valve   . Hepatitis C   . History of complete heart block 12/11/2017  . 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 MRSA (methicillin resistant Staphylococcus aureus)   . Opioid use disorder, moderate, in early remission (HCC) 12/11/2017  . Presence of permanent cardiac pacemaker    [employer]    Past Surgical History:  Procedure Laterality Date  . BELOW KNEE LEG AMPUTATION Bilateral   . PACEMAKER REVISION     moved from chest to abd.   . Permanent pacemaker placement     On 11/24/17-had [facility] Jude dual-chamber permanent pacemaker placed.  . right hand amputation    . STUMP REVISION Right 10/25/2020   Procedure: REVISION RIGHT BELOW KNEE AMPUTATION;  Surgeon: [name], MD;  Location: [facility] OR;  Service: Orthopedics;  Laterality: Right;  . Tricuspid valve  replacement     On 11/17/17-replacement of tricuspid valve with a 29 mm Medtronic Mosaic mitral prosthesis.     Current Outpatient Medications  Medication Sig Dispense Refill  . Acetaminophen 325 MG CAPS Take 650 mg by mouth every 6 (six) hours as needed (pain).     . Ascorbic Acid (VITAMIN C WITH ROSE HIPS) 500 MG tablet Take 1 tablet (500 mg total) by mouth daily. 30 tablet 1  . aspirin EC 81 MG tablet Take 81 mg by mouth daily. Swallow whole.    . methadone (DOLOPHINE) 10 MG/ML solution Take 70 mg by mouth daily.    . minocycline (MINOCIN) 100 MG  capsule Take 100 mg by mouth 2 (two) times daily.    . Multiple Vitamins-Minerals (MULTI COMPLETE/IRON) TABS Take by mouth.    . naloxone (NARCAN) nasal spray 4 mg/0.1 mL Administer 1 spray into 1 nostril once as needed. If no response in 3 minutes repeat with a new naloxone spray in the alternate nostril.     No current facility-administered medications for this visit.    Allergies:   Patient has no known allergies.    ROS:  Please see the history of present illness.   Otherwise, review of systems are positive for none.   All other systems are reviewed and negative.    PHYSICAL EXAM: VS:  BP 110/78   Pulse 68   Wt 140 lb (63.5 kg)   SpO2 94%   BMI 20.09 kg/m  , BMI Body mass index is 20.09 kg/m. GENERAL:  Well appearing NECK:  No jugular venous distention, waveform within normal limits, carotid upstroke brisk and symmetric, no bruits, no thyromegaly LUNGS:  Clear to auscultation bilaterally CHEST:  Unremarkable HEART:  PMI not displaced or sustained,S1 and S2 within normal limits, no S3, no S4, no clicks, no rubs,  murmurs ABD:  Flat, positive bowel sounds normal in frequency in pitch, no bruits, no rebound, no guarding, no midline pulsatile mass, no hepatomegaly, no splenomegaly EXT:  2 plus pulses throughout, no edema, no cyanosis no clubbing   EKG:  EKG is not ordered today.   Recent Labs: 10/25/2020: ALT 14; BUN 20; Creatinine, Ser 0.83; Hemoglobin 13.1; Platelets 156; Potassium 4.2; Sodium 137    Lipid Panel No results found for: CHOL, TRIG, HDL, CHOLHDL, VLDL, LDLCALC, LDLDIRECT    Wt Readings from Last 3 Encounters:  12/15/20 140 lb (63.5 kg)  11/20/20 145 lb (65.8 kg)  11/01/20 145 lb (65.8 kg)      Other studies Reviewed: Additional studies/ records that were reviewed today include: None. Review of the above records demonstrates:     ASSESSMENT AND PLAN:  TVR:  He has severe TV stenosis.   However, he and I have had long discussions about this. He has  no symptoms related to this. I reviewed current guideline recommendations and we should manage this conservatively as long as he is not having any problems. He has endocarditis prophylaxis and he understands good dental hygiene. Has been getting his teeth removed. He would let me know if he ever has symptoms such as abdominal distention again  PPM: He saw Dr. Graciela Husbands and had his VVI pacing changed to VVIR.    IJ DVT:  He completed a course of Xarelto and I stopped his DOAC. He was to restart his aspirin.    Current medicines are reviewed at length with the patient today.  The patient does not have concerns regarding medicines.  The following changes have been  made:  no change  Labs/ tests ordered today include: None No orders of the defined types were placed in this encounter.    Disposition:   FU with me in June    Signed, Jevan Gaunt, MD  12/15/2020 1:07 PM    [facility]

## 2020-12-15 ENCOUNTER — Other Ambulatory Visit: Payer: Self-pay

## 2020-12-15 ENCOUNTER — Encounter: Payer: Self-pay | Admitting: Cardiology

## 2020-12-15 ENCOUNTER — Ambulatory Visit (INDEPENDENT_AMBULATORY_CARE_PROVIDER_SITE_OTHER): Payer: Medicaid Other | Admitting: Cardiology

## 2020-12-15 VITALS — BP 110/78 | HR 68 | Wt 140.0 lb

## 2020-12-15 DIAGNOSIS — I070 Rheumatic tricuspid stenosis: Secondary | ICD-10-CM

## 2020-12-15 DIAGNOSIS — I82721 Chronic embolism and thrombosis of deep veins of right upper extremity: Secondary | ICD-10-CM

## 2020-12-15 NOTE — Patient Instructions (Signed)
Medication Instructions:  °No changes °*If you need a refill on your cardiac medications before your next appointment, please call your pharmacy* ° °Lab Work: °None ordered this visit ° °Testing/Procedures: °None ordered this visit ° °Follow-Up: °At [facility], you and your health needs are our priority.  As part of our continuing mission to provide you with exceptional heart care, we have created designated Provider Care Teams.  These Care Teams include your primary Cardiologist (physician) and Advanced Practice Providers (APPs -  Physician Assistants and Nurse Practitioners) who all work together to provide you with the care you need, when you need it. ° °Your next appointment:   °6 month(s)  °You will receive a reminder letter in the mail two months in advance. If you don't receive a letter, please call our office to schedule the follow-up appointment. ° °The format for your next appointment:   °In Person ° °Provider:   °James Hochrein, MD ° °

## 2020-12-18 ENCOUNTER — Encounter: Payer: Self-pay | Admitting: Physician Assistant

## 2020-12-18 ENCOUNTER — Ambulatory Visit (INDEPENDENT_AMBULATORY_CARE_PROVIDER_SITE_OTHER): Payer: Medicaid Other | Admitting: Physician Assistant

## 2020-12-18 ENCOUNTER — Ambulatory Visit (INDEPENDENT_AMBULATORY_CARE_PROVIDER_SITE_OTHER): Payer: Medicaid Other

## 2020-12-18 ENCOUNTER — Ambulatory Visit: Payer: Self-pay

## 2020-12-18 VITALS — Ht 70.0 in | Wt 140.0 lb

## 2020-12-18 DIAGNOSIS — M25562 Pain in left knee: Secondary | ICD-10-CM | POA: Diagnosis not present

## 2020-12-18 DIAGNOSIS — M25561 Pain in right knee: Secondary | ICD-10-CM

## 2020-12-18 NOTE — Progress Notes (Signed)
Office Visit Note   Patient: Wesley Fuentes           Date of Birth: 07-30-1976           MRN: [id number] Visit Date: 12/18/2020              Requested by: [name], DO [address] [place],  [place] [address] PCP: [name], DO  Chief Complaint  Patient presents with  . Right Leg - Follow-up    10/25/2020 revision right BKA      HPI: Patient is status post below-knee amputations x2. On the right side he recently underwent a revision. He was doing very well but fell off of his power wheelchair onto his stumps. This happened about an hour ago.  Assessment & Plan: Visit Diagnoses:  1. Acute pain of right knee   2. Acute pain of left knee     Plan: Patient should use the shrinker. I did offer to aspirate what I believed to be a hematoma on the right side but he did not want to have this done today. Follow-up in 2 weeks for reevaluation  Follow-Up Instructions: No follow-ups on file.   Ortho Exam  Patient is alert, oriented, no adenopathy, well-dressed, normal affect, normal respiratory effort. Bilateral BKA stumps no cellulitis no significant swelling fairly nontender to palpation he does have what I think to be a small hematoma on the anterior aspect of his right stump but there is no surrounding cellulitis no foul odor no significant swelling  Patient is an existing bilateral transtibial  amputee.  Patient's current comorbidities are not expected to impact the ability to function with the prescribed prosthesis. Patient verbally communicates a strong desire to use a prosthesis. Patient currently requires mobility aids to ambulate without a prosthesis.  Expects not to use mobility aids with a new prosthesis.  Patient is a K3 level ambulator that spends a lot of time walking around on uneven terrain over obstacles, up and down stairs, and ambulates with a variable cadence.    Imaging: XR Knee 1-2 Views Left  Result Date: 12/18/2020 Below-knee amputation  stump cannot appreciate any acute bony changes. He does have findings consistent with some disuse osteopenia  XR Knee 1-2 Views Right  Result Date: 12/18/2020 2 views of the knee were reviewed today no acute bony changes findings consistent with revision of right below-knee amputation  No images are attached to the encounter.  Labs: No results found for: HGBA1C, ESRSEDRATE, CRP, LABURIC, REPTSTATUS, GRAMSTAIN, CULT, LABORGA   Lab Results  Component Value Date   ALBUMIN 3.9 10/25/2020   ALBUMIN 4.3 06/20/2020   ALBUMIN 5.0 12/17/2017    No results found for: MG No results found for: VD25OH  No results found for: PREALBUMIN CBC EXTENDED Latest Ref Rng & Units 10/25/2020 06/20/2020 12/17/2017  WBC 4.0 - 10.5 K/uL 4.4 4.4 7.1  RBC 4.22 - 5.81 MIL/uL 5.55 5.20 5.55  HGB 13.0 - 17.0 g/dL 16.9 11.3(L) 14.8  HCT 39.0 - 52.0 % 43.0 38.4 45.9  PLT 150 - 400 K/uL 156 188 257     Body mass index is 20.09 kg/m.  Orders:  Orders Placed This Encounter  Procedures  . XR Knee 1-2 Views Right  . XR Knee 1-2 Views Left   No orders of the defined types were placed in this encounter.    Procedures: No procedures performed  Clinical Data: No additional findings.  ROS:  All other systems negative, except as noted in  the HPI. Review of Systems  Objective: Vital Signs: Ht 5\' 10"  (1.778 m)   Wt 140 lb (63.5 kg)   BMI 20.09 kg/m   Specialty Comments:  No specialty comments available.  PMFS History: Patient Active Problem List   Diagnosis Date Noted  . 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 Dehiscence of amputation stump (HCC)   . Pacemaker - MDT 09/09/2020  . Wound of left leg 08/24/2020  . Fatigue 07/31/2020  . 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 Educated about COVID-19 virus infection 05/11/2020  . S/P bilateral below knee amputation (HCC) 05/09/2020  . 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 History of  bacterial endocarditis 05/02/2020  . 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 Complete heart block (HCC) 12/11/2017  . 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 Opioid use disorder, moderate, in early remission (HCC) 12/11/2017  . Depression 12/11/2017   Past Medical History:  Diagnosis Date  . CHB (complete heart block) (HCC)   . Depression 12/11/2017  . Endocarditis of tricuspid valve   . Hepatitis C   . History of complete heart block 12/11/2017  . 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 MRSA (methicillin resistant Staphylococcus aureus)   . Opioid use disorder, moderate, in early remission (HCC) 12/11/2017  . Presence of permanent cardiac pacemaker    MedTronic    Family History  Problem Relation Age of Onset  . Diabetes Maternal Grandmother   . Diabetes Maternal Grandfather   . Cancer Maternal Grandfather        oral  . Cervical cancer Mother     Past Surgical History:  Procedure Laterality Date  . BELOW KNEE LEG AMPUTATION Bilateral   . PACEMAKER REVISION     moved from chest to abd.   . Permanent pacemaker placement     On 11/24/17-had [facility] Jude dual-chamber permanent pacemaker placed.  . right hand amputation    . STUMP REVISION Right 10/25/2020   Procedure: REVISION RIGHT BELOW KNEE AMPUTATION;  Surgeon: 10/27/2020, MD;  Location: [facility] OR;  Service: Orthopedics;  Laterality: Right;  . Tricuspid valve replacement     On 11/17/17-replacement of tricuspid valve with a 29 mm Medtronic Mosaic mitral prosthesis.   Social History   Occupational History  . Not on file  Tobacco Use  . Smoking status: Former 11/19/17  . Smokeless tobacco: Never Used  Vaping Use  . Vaping Use: Former  Substance and Sexual Activity  . Alcohol use: Not Currently  . Drug use: Not Currently    Types: Other-see comments    Comment: on methadone; former user  . Sexual activity: Not on file

## 2020-12-19 NOTE — Progress Notes (Signed)
Remote pacemaker transmission.   

## 2021-01-01 ENCOUNTER — Ambulatory Visit: Payer: Medicaid Other | Admitting: Physician Assistant

## 2021-01-10 NOTE — Therapy (Signed)
[facility] [address] [place], [place], [address] Phone: [phone]   Fax:  [phone]  Patient Details  Name: Wesley Fuentes MRN: [id number] Date of Birth: 08/25/1976 Referring Provider:  No ref. provider found  Encounter Date: 01/10/2021 PHYSICAL THERAPY DISCHARGE SUMMARY/non visit discharge summary  Visits from Start of Care: 12  Current functional level related to goals / functional outcomes: Pt last seen 10/11/20 for PT. Was placed on hold due to upcoming right BKA revision.  Was ambulating with cane prior to this. See goals below for last assessment.  Remaining deficits: Bilateral BKA with strength and balance deficits   Education / Equipment: HEP  Plan: Patient agrees to discharge.  Patient goals were not met. Patient is being discharged due to a change in medical status.  ?????         PT Short Term Goals - 09/29/20 1917      PT SHORT TERM GOAL #1   Title Pt will be independent with prosthetic care and management to manage prosthesis on own.    Baseline currently needs assist to determine proper sock ply    Time 6    Period Weeks    Status New    Target Date 11/10/20      PT SHORT TERM GOAL #2   Title Pt will be able to tolerate wearing prosthesis for 8 hours/day.    Baseline currently up to 6 hours/day. Progress has been slowed due to small wound on right residual limb 09/07/20    Time 6    Period Weeks    Status On-going    Target Date 11/10/20      PT SHORT TERM GOAL #3   Title Pt will increase Berg form 37/56 to >41/56 for improved balance and decreased fall risk.    Baseline 37/56 on 09/29/20    Time 6    Period Weeks    Status New    Target Date 11/10/20      PT SHORT TERM GOAL #4   Title Pt will ambulate >500' with cane on varied surfaces mod I for improved mobility in community.    Baseline 345' with cane supervision/CGA on level surfaces    Time 6    Period Weeks    Status  New    Target Date 11/10/20           PT Long Term Goals - 09/29/20 1922      PT LONG TERM GOAL #1   Title Pt will tolerate wearing prostheses all awake hours with no skin issues for improved function.    Baseline 6 hours/day currently with reoccurring wound on right distal tibia.    Time 12    Period Weeks    Status Revised    Target Date 12/22/20      PT LONG TERM GOAL #2   Title Pt will increase gait speed from 0.72m/s to >0.20m/s for improved gait safety in community.    Baseline 09/29/20 0.75m/s    Time 12    Period Weeks    Status New    Target Date 12/22/20      PT LONG TERM GOAL #3   Title Pt will be able to perform Berg Balance increasing score from 37/56 to >46/56 for improved balance and decreased fall risk.    Baseline 09/29/20 37/56 on Berg    Time 12    Period Weeks    Status Revised    Target Date 12/22/20  PT LONG TERM GOAL #4   Title Pt will ambulate >800' on varied surfaces without AD for improved community mobility independently.    Baseline 345' with cane supervision/CGA    Time 12    Period Weeks    Status New    Target Date 12/22/20      PT LONG TERM GOAL #5   Title Pt will be able to negotiate up/down curb/ramp with LRAD and stairs with railing mod I for improved community access.    Baseline CGA with stair negotiation with 1 rail, min assist on ramp, min assist +2 on curb with SPC    Time 12    Period Weeks    Status On-going    Target Date 12/22/20           Electa Sniff, PT, DPT, NCS 01/10/2021, 12:48 PM  [facility] [address] [place], [place], [address] Phone: [phone]   Fax:  [phone]

## 2021-01-19 ENCOUNTER — Ambulatory Visit: Payer: Medicaid Other | Admitting: Physician Assistant

## 2021-01-29 ENCOUNTER — Ambulatory Visit: Payer: Medicaid Other | Admitting: Orthopedic Surgery

## 2021-01-30 ENCOUNTER — Encounter: Payer: Self-pay | Admitting: Orthopedic Surgery

## 2021-01-30 ENCOUNTER — Ambulatory Visit (INDEPENDENT_AMBULATORY_CARE_PROVIDER_SITE_OTHER): Payer: Medicaid Other | Admitting: Physician Assistant

## 2021-01-30 DIAGNOSIS — Z89511 Acquired absence of right leg below knee: Secondary | ICD-10-CM

## 2021-01-30 DIAGNOSIS — Z89512 Acquired absence of left leg below knee: Secondary | ICD-10-CM

## 2021-01-30 NOTE — Progress Notes (Signed)
Office Visit Note   Patient: Wesley Fuentes           Date of Birth: 01-20-1976           MRN: [id number] Visit Date: 01/30/2021              Requested by: [name], DO [address] [place],  [place] [address] PCP: [name], DO  Chief Complaint  Patient presents with  . Right Leg - Follow-up    10/25/2020 revision right BKA       HPI: Patient is a pleasant 45 year old gentleman who is status post right below-knee amputation revision 2-1/2 months ago.  He is also status post left below-knee amputation and right hand amputation.  He is in the process of working with Technical sales engineer and is receiving his new prosthetics.  He has no complaints  Assessment & Plan: Visit Diagnoses:  1. S/P bilateral below knee amputation (HCC)     Plan: We have entered an order for physical therapy today.  He will follow-up for final visit in 1 month.  Follow-Up Instructions: No follow-ups on file.   Ortho Exam  Patient is alert, oriented, no adenopathy, well-dressed, normal affect, normal respiratory effort. Examination right below-knee amputation stump well-healed surgical incision incision.  Swelling is well controlled.  No drainage no erythema nontender to palpation. No sign of infection  Imaging: No results found. No images are attached to the encounter.  Labs: No results found for: HGBA1C, ESRSEDRATE, CRP, LABURIC, REPTSTATUS, GRAMSTAIN, CULT, LABORGA   Lab Results  Component Value Date   ALBUMIN 3.9 10/25/2020   ALBUMIN 4.3 06/20/2020   ALBUMIN 5.0 12/17/2017    No results found for: MG No results found for: VD25OH  No results found for: PREALBUMIN CBC EXTENDED Latest Ref Rng & Units 10/25/2020 06/20/2020 12/17/2017  WBC 4.0 - 10.5 K/uL 4.4 4.4 7.1  RBC 4.22 - 5.81 MIL/uL 5.55 5.20 5.55  HGB 13.0 - 17.0 g/dL 01.6 11.3(L) 14.8  HCT 39.0 - 52.0 % 43.0 38.4 45.9  PLT 150 - 400 K/uL 156 188 257     There is no height or weight on file to calculate BMI.  Orders:   Orders Placed This Encounter  Procedures  . Ambulatory referral to Physical Therapy   No orders of the defined types were placed in this encounter.    Procedures: No procedures performed  Clinical Data: No additional findings.  ROS:  All other systems negative, except as noted in the HPI. Review of Systems  Objective: Vital Signs: There were no vitals taken for this visit.  Specialty Comments:  No specialty comments available.  PMFS History: Patient Active Problem List   Diagnosis Date Noted  . 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 Dehiscence of amputation stump (HCC)   . Pacemaker - MDT 09/09/2020  . Wound of left leg 08/24/2020  . Fatigue 07/31/2020  . 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 Educated about COVID-19 virus infection 05/11/2020  . S/P bilateral below knee amputation (HCC) 05/09/2020  . 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 History of bacterial endocarditis 05/02/2020  . 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 Complete heart block (HCC) 12/11/2017  . 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 Opioid use disorder, moderate, in early remission (HCC) 12/11/2017  . Depression 12/11/2017   Past Medical History:  Diagnosis Date  . CHB (complete heart block) (HCC)   .  Depression 12/11/2017  . Endocarditis of tricuspid valve   . Hepatitis C   . History of complete heart block 12/11/2017  . 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 MRSA (methicillin resistant Staphylococcus aureus)   . Opioid use disorder, moderate, in early remission (HCC) 12/11/2017  . Presence of permanent cardiac pacemaker    Med[employer]    Family History  Problem Relation Age of Onset  . Diabetes Maternal Grandmother   . Diabetes Maternal Grandfather   . Cancer Maternal Grandfather        oral  . Cervical cancer Mother     Past Surgical History:   Procedure Laterality Date  . BELOW KNEE LEG AMPUTATION Bilateral   . PACEMAKER REVISION     moved from chest to abd.   . Permanent pacemaker placement     On 11/24/17-had [facility] Jude dual-chamber permanent pacemaker placed.  . right hand amputation    . STUMP REVISION Right 10/25/2020   Procedure: REVISION RIGHT BELOW KNEE AMPUTATION;  Surgeon: [name], MD;  Location: [facility] OR;  Service: Orthopedics;  Laterality: Right;  . Tricuspid valve replacement     On 11/17/17-replacement of tricuspid valve with a 29 mm Medtronic Mosaic mitral prosthesis.   Social History   Occupational History  . Not on file  Tobacco Use  . Smoking status: Former Games developer  . Smokeless tobacco: Never Used  Vaping Use  . Vaping Use: Former  Substance and Sexual Activity  . Alcohol use: Not Currently  . Drug use: Not Currently    Types: Other-see comments    Comment: on methadone; former user  . Sexual activity: Not on file

## 2021-02-07 NOTE — Progress Notes (Deleted)
° ° °  SUBJECTIVE:   CHIEF COMPLAINT / HPI:   Medication management: Patient is 45 year old male who presents today for medication management.  Patient is status post Xarelto and per the last cardiology notes this is been discontinued and he has restarted his aspirin. He states***.  PERTINENT  PMH / PSH: ***  OBJECTIVE:   There were no vitals taken for this visit.   General: NAD, pleasant, able to participate in exam, seated in wheelchair***. Cardiac: RRR, no murmurs. Respiratory: CTAB, normal effort Abdomen: Bowel sounds present, nontender Extremities: *** Skin: warm and dry, no rashes noted Neuro: alert, no obvious focal deficits Psych: Normal affect and mood  ASSESSMENT/PLAN:   No problem-specific Assessment & Plan notes found for this encounter.     [name], DO [facility]    This note was prepared using Dragon voice recognition software and may include unintentional dictation errors due to the inherent limitations of voice recognition software.

## 2021-02-07 NOTE — Patient Instructions (Incomplete)
It was great to see you! Thank you for allowing me to participate in your care!  Our plans for today:  - *** -   We are checking some labs today, I will call you if they are abnormal will send you a MyChart message or a letter if they are normal.  If you do not hear about your labs in the next 2 weeks please let us know.***  Take care and seek immediate care sooner if you develop any concerns.   Dr. Markia Kyer, DO [facility]  

## 2021-02-08 ENCOUNTER — Ambulatory Visit: Payer: Medicaid Other | Admitting: Family Medicine

## 2021-02-12 NOTE — Progress Notes (Signed)
    SUBJECTIVE:   CHIEF COMPLAINT / HPI:   Medication management: Wants to get on Marinol to improve appetite and help with some nausea. He is also helpful this will help him cut down on smoking marijuana. Denies phantom pain. Patient also states that he is interested and trying to get the prescription for testosterone to help with his energy level. He states he has had some reduced libido. Previously he had a testosterone level checked in the morning that was within normal limits of 318.  PERTINENT  PMH / PSH: Bilateral lower extremity amputee, unilateral upper extremity amputation  OBJECTIVE:   BP 100/60   Pulse 72   Ht 5\' 10"  (1.778 m)   SpO2 95%   BMI 20.09 kg/m    Flowsheet Row Office Visit from 02/13/2021 in [facility]  PHQ-9 Total Score 8      General: NAD, pleasant, able to participate in exam, seated in wheelchair. Cardiac: RRR, no murmurs. Respiratory: CTAB, normal effort Extremities: Bilateral lower extremity amputee, unilateral upper extremity amputee Skin: warm and dry, no rashes noted Neuro: alert, no obvious focal deficits Psych: Normal affect and mood  ASSESSMENT/PLAN:   Medication management: Assessment/plan: 45 year old male with multiple questions regarding medications. He is interested in Marinol to improve his appetite and to help him cut down on marijuana usage. I discussed with him and confirmed with preceptor that this would be inappropriate with his history of drug abuse and current marijuana usage. Patient also has some concerns requesting starting testosterone replacement therapy to boost his energy and libido. He previously had a testosterone level drawn last year that was within normal limits. I discussed with him that insurance likely would not cover testosterone should be prescribed with his level within normal limits and that in my medical opinion it would not be appropriate. Patient is understanding of this. I discussed  modalities for improving libido including initiating exercise which he plans to start physical therapy again soon. Also discussed Viagra and Cialis but the patient is not interested in these at this time. Patient plans to start doing physical therapy again and begin exercising on a regular basis and will follow up in 3 months for reassessment.  47, DO [facility]    This note was prepared using Dragon voice recognition software and may include unintentional dictation errors due to the inherent limitations of voice recognition software.

## 2021-02-12 NOTE — Patient Instructions (Signed)
It was great to see you! Thank you for allowing me to participate in your care!  Our plans for today:  -I would like for you to work on getting back into physical therapy and then starting to do regular cardiovascular and strength training exercise.  I would like to follow-up with you in about 3 to 4 months to see how you are doing from an energy and weight gain standpoint.  We can also assess libido at that time.  If you have any questions or concerns before then do not hesitate to reach out.  Take care and seek immediate care sooner if you develop any concerns.   Dr. [name], DO [facility]

## 2021-02-13 ENCOUNTER — Encounter: Payer: Self-pay | Admitting: Family Medicine

## 2021-02-13 ENCOUNTER — Other Ambulatory Visit: Payer: Self-pay

## 2021-02-13 ENCOUNTER — Ambulatory Visit (INDEPENDENT_AMBULATORY_CARE_PROVIDER_SITE_OTHER): Payer: Medicaid Other | Admitting: Family Medicine

## 2021-02-13 VITALS — BP 100/60 | HR 72 | Ht 70.0 in

## 2021-02-13 DIAGNOSIS — Z79899 Other long term (current) drug therapy: Secondary | ICD-10-CM

## 2021-02-17 IMAGING — US US ABDOMEN LIMITED
1 series · 14 of 17 positions shown · non-contrast
Comparison: No prior exams available for comparison.

CLINICAL DATA: Ascites.

EXAM:
LIMITED ABDOMEN ULTRASOUND FOR ASCITES
TECHNIQUE: Limited ultrasound survey for ascites was performed in all four
abdominal quadrants.

[Series 1: us abdomen limited · 14 of 17 slices shown]
[im 1/17]
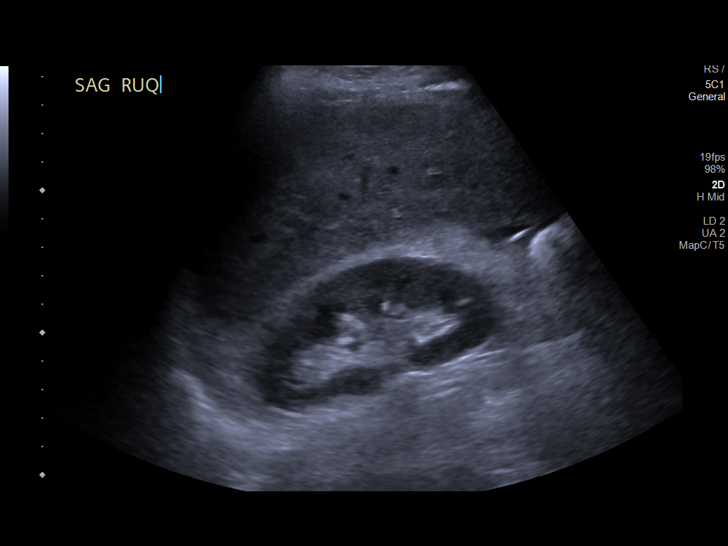
[im 2/17]
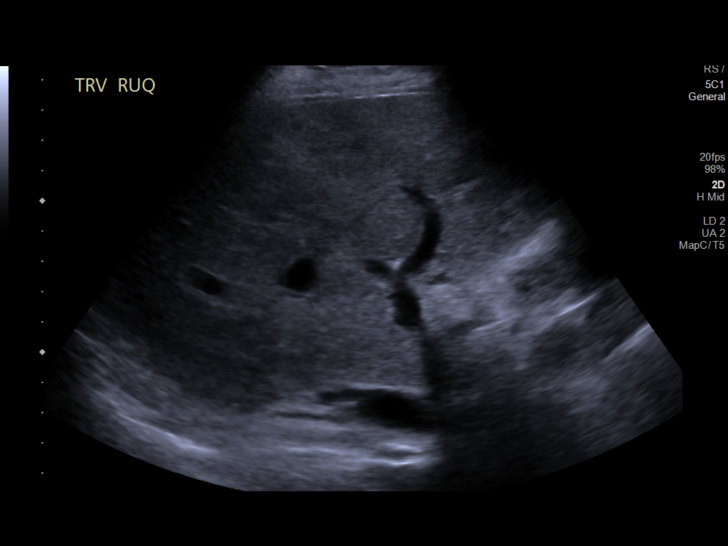
[im 4/17]
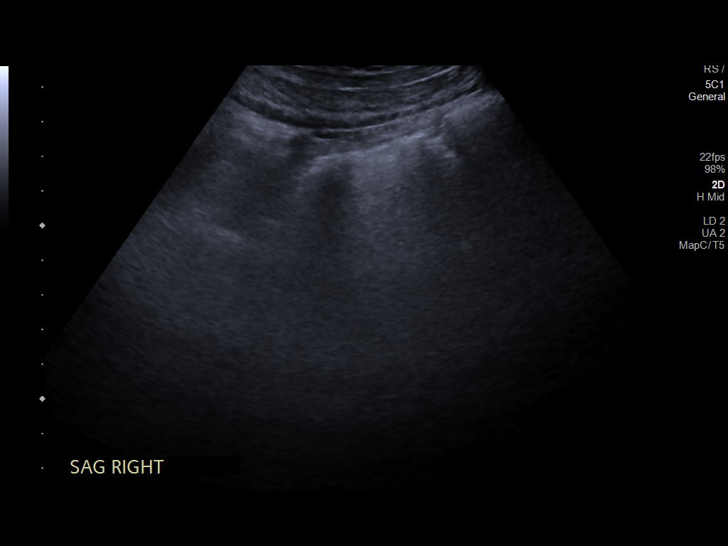
[im 5/17]
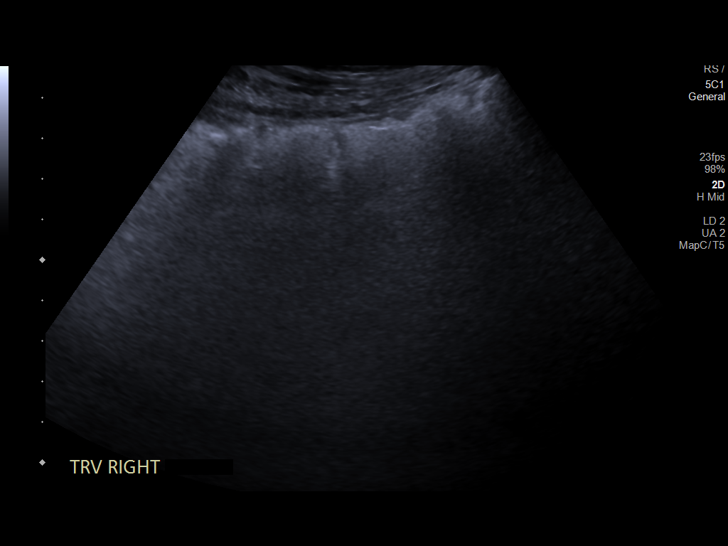
[im 6/17]
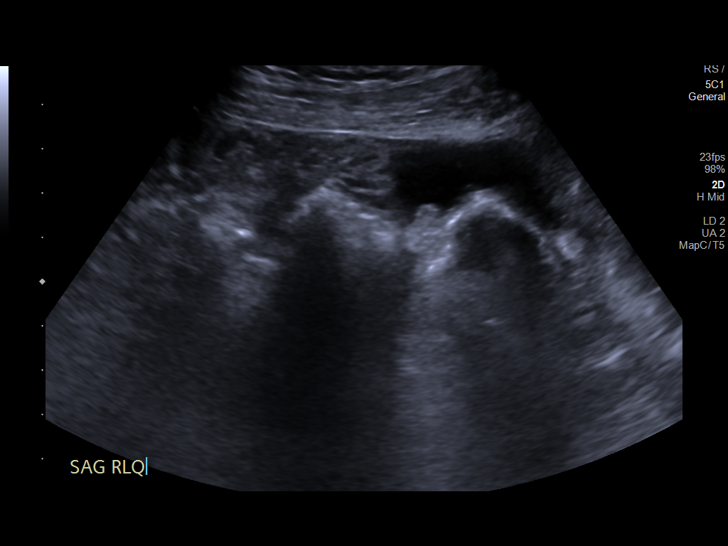
[im 7/17]
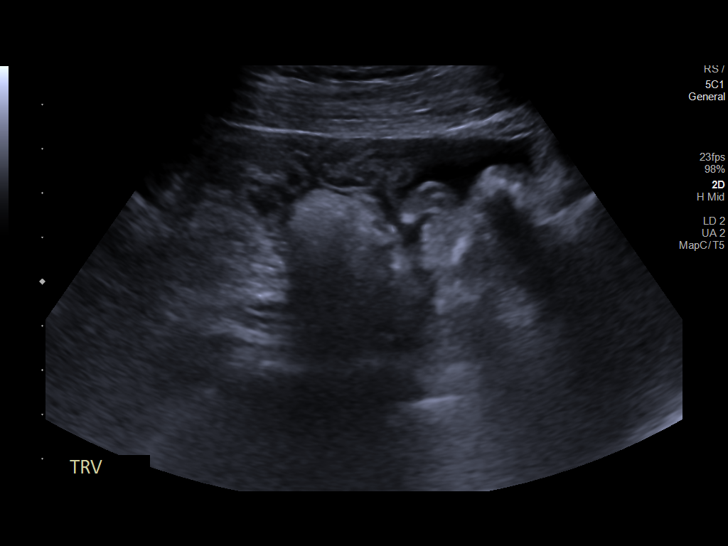
[im 8/17]
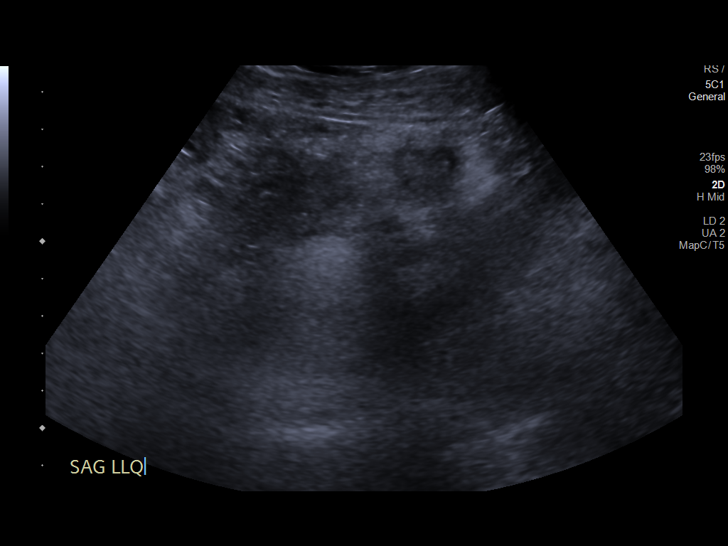
[im 10/17]
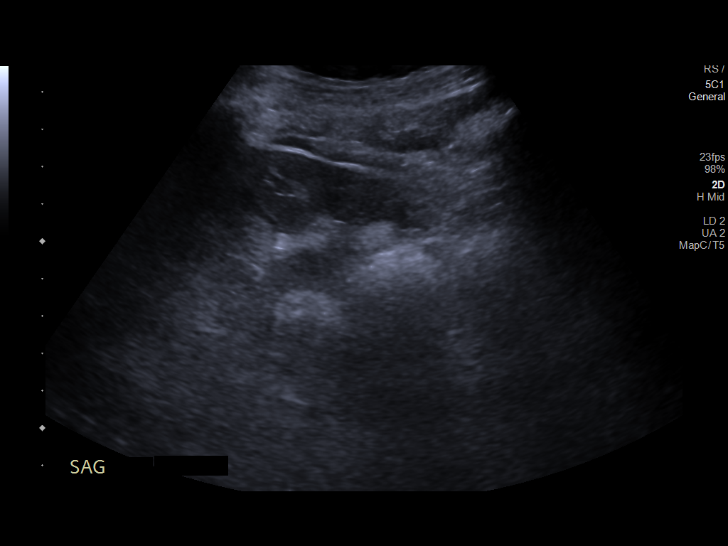
[im 11/17]
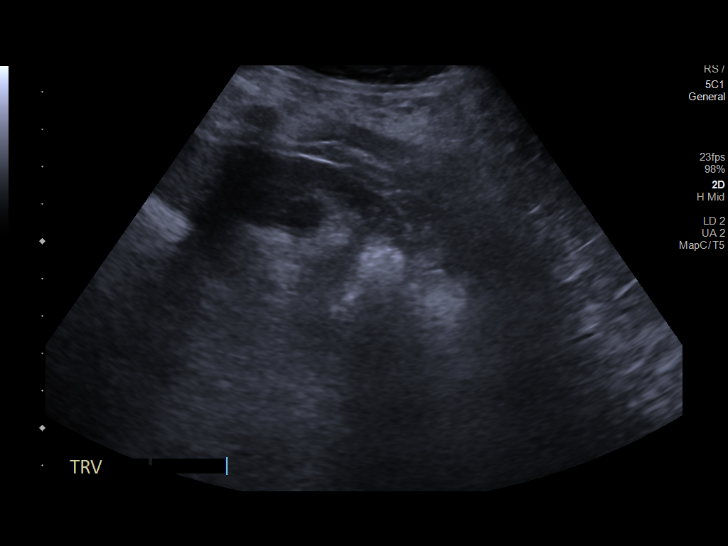
[im 12/17]
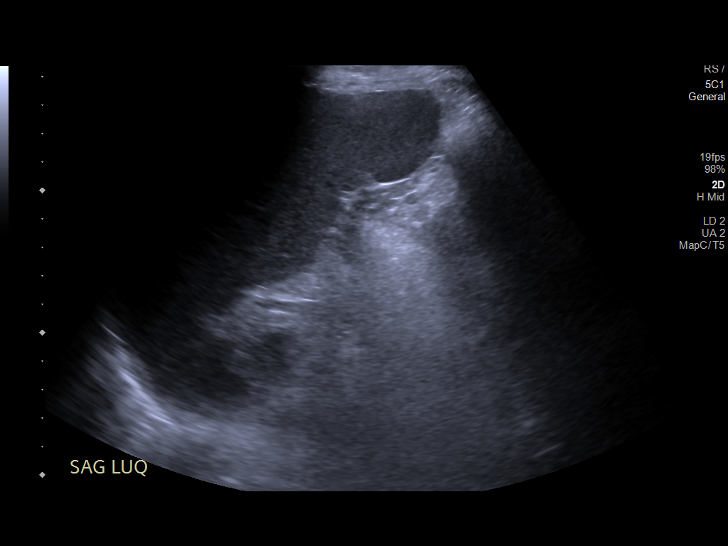
[im 13/17]
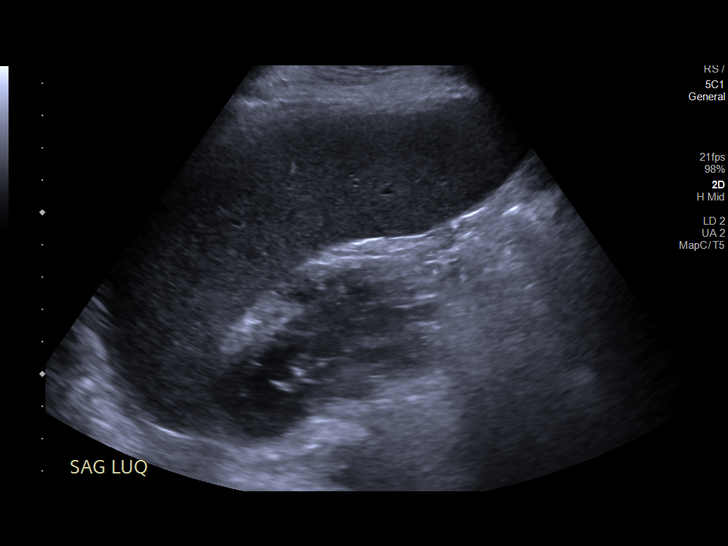
[im 14/17]
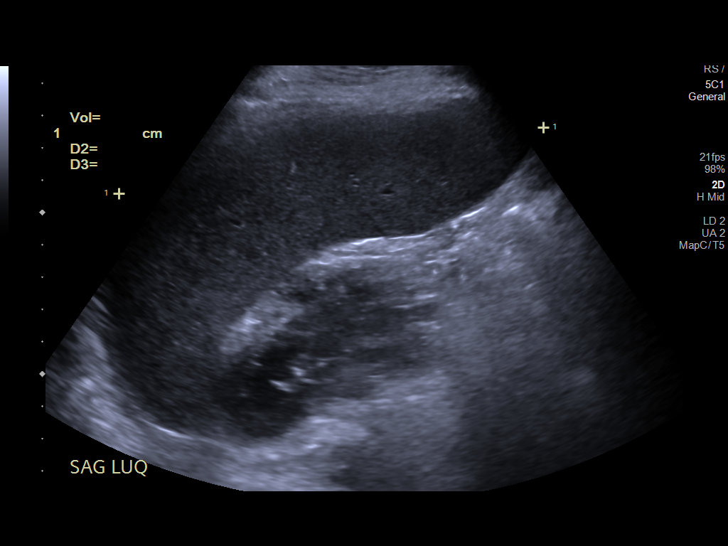
[im 16/17]
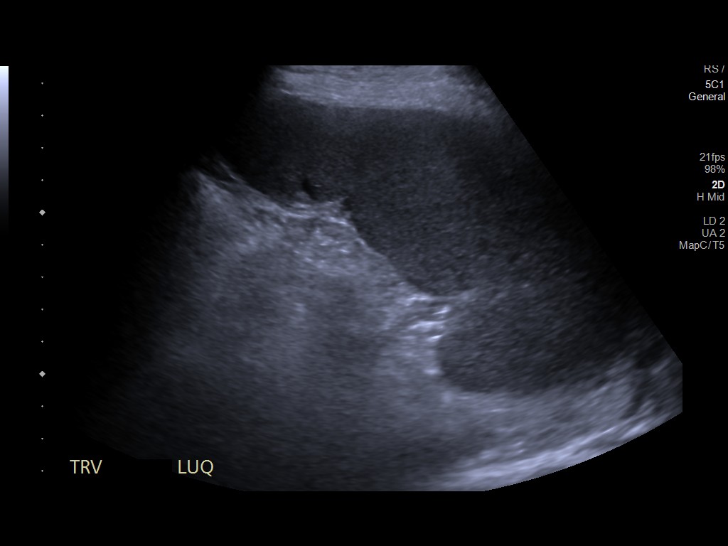
[im 17/17]
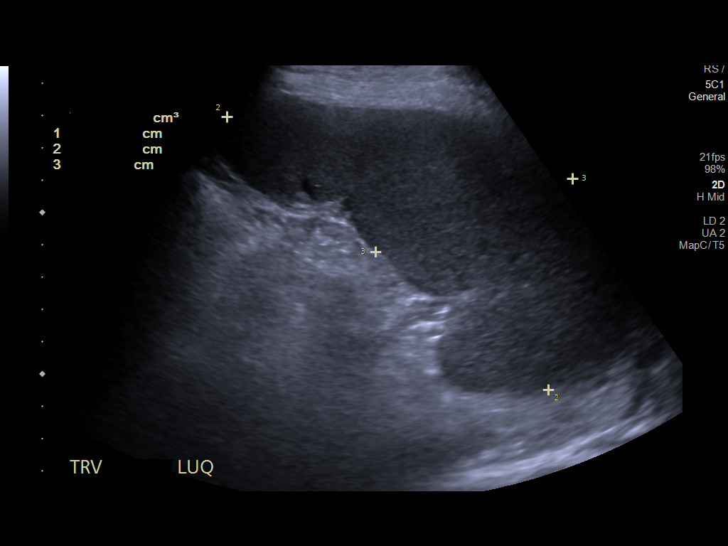

[14 of 17 positions shown; findings below may reference images not displayed]

FINDINGS: Sonographic survey to assess for ascites demonstrates small amount
of free fluid in the right lower quadrant, and trace perihepatic
ascites. Splenomegaly is noted with greatest splenic length of
cm in volume of 590 cc.
IMPRESSION: 1. Small amount of right lower quadrant ascites.
2. Splenomegaly.  No prior exams available for comparison.

## 2021-02-19 ENCOUNTER — Encounter: Payer: Medicaid Other | Admitting: Internal Medicine

## 2021-03-02 ENCOUNTER — Encounter: Payer: Medicaid Other | Admitting: Internal Medicine

## 2021-03-05 ENCOUNTER — Telehealth: Payer: Self-pay

## 2021-03-05 NOTE — Telephone Encounter (Signed)
Patient calls nurse line in regards to appetite medication. Patient reports he was seen recently by PCP discussing poor appetite and was told there are alternative medications he can try. Patient reports at the time he did not want to do this, however has since changed his mind. Patient reports he belives the medication also helped with anxiety/depression. The patient stated he is currently on Zoloft. I do not see anything in recent PCP note and advised he may need an apt. Will forward to PCP.

## 2021-03-07 ENCOUNTER — Ambulatory Visit (INDEPENDENT_AMBULATORY_CARE_PROVIDER_SITE_OTHER): Payer: Medicaid Other

## 2021-03-07 DIAGNOSIS — I442 Atrioventricular block, complete: Secondary | ICD-10-CM

## 2021-03-09 LAB — CUP PACEART REMOTE DEVICE CHECK
Battery Remaining Longevity: 153 mo
Battery Voltage: 3.07 V
Brady Statistic RV Percent Paced: 99.98 %
Date Time Interrogation Session: 20220308203617
Implantable Lead Implant Date: 20210308
Implantable Lead Location: 753862
Implantable Lead Model: 4968
Implantable Pulse Generator Implant Date: 20210308
Lead Channel Impedance Value: 570 Ohm
Lead Channel Impedance Value: 779 Ohm
Lead Channel Pacing Threshold Amplitude: 1.125 V
Lead Channel Pacing Threshold Pulse Width: 0.4 ms
Lead Channel Sensing Intrinsic Amplitude: 7.625 mV
Lead Channel Sensing Intrinsic Amplitude: 7.625 mV
Lead Channel Setting Pacing Amplitude: 2.25 V
Lead Channel Setting Pacing Pulse Width: 0.4 ms
Lead Channel Setting Sensing Sensitivity: 1.2 mV

## 2021-03-12 ENCOUNTER — Encounter: Payer: Medicaid Other | Admitting: Student

## 2021-03-15 NOTE — Progress Notes (Signed)
Remote pacemaker transmission.   

## 2021-03-26 NOTE — Progress Notes (Signed)
    SUBJECTIVE:   CHIEF COMPLAINT / HPI:   Depression/medication management: Patient is 45 year old male presents today for medication management.  He is currently on Zoloft for history of depression and is interested in a medication that may help with depression and may improve his appetite as well.  We spoke previously about the possibility of medication such as Remeron at a past appointment.  At that time the patient was not interested but he states he has since changed his mind and may want to pursue this.  Patient denies suicidal or homicidal ideation at this time.  He states that he continues on Zoloft but would like to eventually wean off of this as he does not like to take a lot of medicines and like to switch it to mirtazapine  Medication management: Patient states that he is no longer on methadone and previously went to internal medicine for Suboxone but when he followed up with them they stated that he needed another referral for opiate use disorder for Suboxone.  He states that he would like to have this today as he believes that would be beneficial for him.  "Dark urine": Patient also complains of dark urine which she states has been present for couple months.  He states this does not seem to change even when he drinks lots of fluids.  Denies any increase in urinary frequency, dysuria, or suprapubic discomfort.  PERTINENT  PMH / PSH: Bilateral below-knee amputation unilateral upper extremity amputation  OBJECTIVE:   BP (!) 116/100   Pulse 77   Ht 5\' 10"  (1.778 m)   Wt 157 lb 2 oz (71.3 kg)   SpO2 95%   BMI 22.55 kg/m    Repeat blood pressure 128/86  General: NAD, pleasant, able to participate in exam Cardiac: RRR, no murmurs. Respiratory: CTAB, normal effort Psych: Normal affect and mood  ASSESSMENT/PLAN:   Depression Assessment: 45 year old male with history of depression.  He is currently on Zoloft and this not been noting any significant benefit with this and  would like to try changing it for another medication like mirtazapine that can improve his appetite and help him sleep better at night.  Patient denies SI or HI at this time. Plan:  -We will initiate mirtazapine at 15 mg/day -Patient will continue the Zoloft with plan to reduce it by one half in 7 days and continue that for 2 weeks followed by completely stopping the medication.  I will call the patient in 1 week to see how he is doing and can consider increasing the mirtazapine dose to 30 mg/day at that time.  Opioid use disorder, moderate, in early remission [facility]) Patient is no longer on methadone and request to get set up with a Suboxone clinic with internal medicine.  He is previously followed with them but when he returned they stated they needed a another referral.  We will send this referral.   Dark urine: Urinalysis performed today shows clear urine with no obvious signs of any problem.  His specific gravity is slightly elevated but otherwise no concerns.  Patient plans to continue to monitor his symptoms.  Patient denies any increase in urinary frequency, dysuria, or pain with palpation of his abdomen.  [facility], DO [facility]    This note was prepared using Dragon voice recognition software and may include unintentional dictation errors due to the inherent limitations of voice recognition software.

## 2021-03-26 NOTE — Patient Instructions (Signed)
It was great to see you! Thank you for allowing me to participate in your care!  I recommend that you always bring your medications to each appointment as this makes it easy to ensure we are on the correct medications and helps [place] not miss when refills are needed.  Our plans for today:  -I am providing you with a letter stating that I think you would benefit from a medical standpoint from the shower stall -I have sent in the mirtazapine medication.  I would like for you to continue taking 1 dose of the Zoloft per day for the next 1 week then reduce it by half dose for the next 2 weeks, then you can stop. -I have sent in the referral for Suboxone -We checked your urine today and did not see any obvious signs of concern with it.  We are checking some labs today, I will call you if they are abnormal will send you a MyChart message or a letter if they are normal.  If you do not hear about your labs in the next 2 weeks please let us know.  Take care and seek immediate care sooner if you develop any concerns.   Dr. [name], DO [facility]

## 2021-03-27 ENCOUNTER — Other Ambulatory Visit: Payer: Self-pay

## 2021-03-27 ENCOUNTER — Ambulatory Visit (INDEPENDENT_AMBULATORY_CARE_PROVIDER_SITE_OTHER): Payer: Medicaid Other | Admitting: Family Medicine

## 2021-03-27 ENCOUNTER — Telehealth: Payer: Self-pay

## 2021-03-27 ENCOUNTER — Encounter: Payer: Self-pay | Admitting: Family Medicine

## 2021-03-27 VITALS — BP 116/100 | HR 77 | Ht 70.0 in | Wt 157.1 lb

## 2021-03-27 DIAGNOSIS — R82998 Other abnormal findings in urine: Secondary | ICD-10-CM | POA: Diagnosis not present

## 2021-03-27 DIAGNOSIS — F32A Depression, unspecified: Secondary | ICD-10-CM

## 2021-03-27 DIAGNOSIS — F1190 Opioid use, unspecified, uncomplicated: Secondary | ICD-10-CM | POA: Diagnosis not present

## 2021-03-27 DIAGNOSIS — F1121 Opioid dependence, in remission: Secondary | ICD-10-CM

## 2021-03-27 LAB — POCT URINALYSIS DIP (MANUAL ENTRY)
Bilirubin, UA: NEGATIVE
Blood, UA: NEGATIVE
Glucose, UA: NEGATIVE mg/dL
Ketones, POC UA: NEGATIVE mg/dL
Leukocytes, UA: NEGATIVE
Nitrite, UA: NEGATIVE
Protein Ur, POC: NEGATIVE mg/dL
Spec Grav, UA: >=1.03 — AB (ref 1.010–1.025)
Urobilinogen, UA: 0.2 E.U./dL
pH, UA: 5.5 (ref 5.0–8.0)

## 2021-03-27 MED ORDER — MIRTAZAPINE 15 MG PO TABS: 15.0000 mg | ORAL_TABLET | Freq: Every day | ORAL | 1 refills | Status: DC

## 2021-03-27 NOTE — Telephone Encounter (Signed)
Returned call to patient. Explained process of intake questions that would then be forwarded to OUD Attendings for consideration of appt. Explained that we see patients on Tuesday mornings only. Patient stated this would be fine. Began asking questions when patient interrupted, stating he just got off methadone and cannot wait a week. States last time he went through this process at [facility] he was given a Rx that day. Explained that this is not our current process. Patient repeated that he needed something today. Explained again that that is not how this process is done now. Patient then hung up.

## 2021-03-27 NOTE — Telephone Encounter (Signed)
Requesting an appt for new pt OUD. Please call pt back.

## 2021-03-27 NOTE — Assessment & Plan Note (Signed)
Assessment: 45 year old male with history of depression.  He is currently on Zoloft and this not been noting any significant benefit with this and would like to try changing it for another medication like mirtazapine that can improve his appetite and help him sleep better at night.  Patient denies SI or HI at this time. Plan:  -We will initiate mirtazapine at 15 mg/day -Patient will continue the Zoloft with plan to reduce it by one half in 7 days and continue that for 2 weeks followed by completely stopping the medication.  I will call the patient in 1 week to see how he is doing and can consider increasing the mirtazapine dose to 30 mg/day at that time.

## 2021-03-27 NOTE — Assessment & Plan Note (Signed)
Patient is no longer on methadone and request to get set up with a Suboxone clinic with internal medicine.  He is previously followed with them but when he returned they stated they needed a another referral.  We will send this referral.

## 2021-04-18 ENCOUNTER — Other Ambulatory Visit: Payer: Self-pay

## 2021-04-18 ENCOUNTER — Encounter: Payer: Self-pay | Admitting: Rehabilitation

## 2021-04-18 ENCOUNTER — Ambulatory Visit: Payer: Medicaid Other | Attending: Orthopedic Surgery | Admitting: Rehabilitation

## 2021-04-18 DIAGNOSIS — R2689 Other abnormalities of gait and mobility: Secondary | ICD-10-CM

## 2021-04-18 DIAGNOSIS — R2681 Unsteadiness on feet: Secondary | ICD-10-CM

## 2021-04-18 DIAGNOSIS — M6281 Muscle weakness (generalized): Secondary | ICD-10-CM

## 2021-04-18 DIAGNOSIS — R293 Abnormal posture: Secondary | ICD-10-CM

## 2021-04-18 NOTE — Therapy (Signed)
[facility] [address] [place], [place], [address] Phone: [phone]   Fax:  [phone]  Physical Therapy Evaluation  Patient Details  Name: Wesley Fuentes MRN: [id number] Date of Birth: 09-Mar-1976 Referring Provider (PT): Aldean Baker, MD [facility], [place])   Encounter Date: 04/18/2021   PT End of Session - 04/18/21 1513    Visit Number 1    Number of Visits 11    Authorization Type MCD-awaiting approval    PT Start Time 1315    PT Stop Time 1400    PT Time Calculation (min) 45 min    Activity Tolerance Patient tolerated treatment well           Past Medical History:  Diagnosis Date  . CHB (complete heart block) (HCC)   . Depression 12/11/2017  . Endocarditis of tricuspid valve   . Hepatitis C   . History of complete heart block 12/11/2017  . 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 MRSA (methicillin resistant Staphylococcus aureus)   . Opioid use disorder, moderate, in early remission (HCC) 12/11/2017  . Presence of permanent cardiac pacemaker    MedTronic    Past Surgical History:  Procedure Laterality Date  . BELOW KNEE LEG AMPUTATION Bilateral   . PACEMAKER REVISION     moved from chest to abd.   . Permanent pacemaker placement     On 11/24/17-had [facility] Jude dual-chamber permanent pacemaker placed.  . right hand amputation    . STUMP REVISION Right 10/25/2020   Procedure: REVISION RIGHT BELOW KNEE AMPUTATION;  Surgeon: [name], MD;  Location: [facility] OR;  Service: Orthopedics;  Laterality: Right;  . Tricuspid valve replacement     On 11/17/17-replacement of tricuspid valve with a 29 mm Medtronic Mosaic mitral prosthesis.    There were no vitals filed for this visit.    Subjective Assessment - 04/18/21 1314    Subjective Pt returns with revision of R BKA in Oct 2021 (was delayed due to delayed healing time and also waiting on new prosthesis) and  wanting to get back to where I was and better.    Patient is accompained by: Family member    How long can you stand comfortably? 15-20 mins    How long can you walk comfortably? 20 mins    Patient Stated Goals "I want to be able to hike, bike, drive."    Currently in Pain? Yes    Pain Score 5     Pain Location Leg    Pain Orientation Right    Pain Descriptors / Indicators --   phantom pain   Pain Type Phantom pain    Pain Onset More than a month ago    Pain Frequency Intermittent    Aggravating Factors  nothing    Pain Relieving Factors taking legs off, rest              [facility] PT Assessment - 04/18/21 1318      Assessment   Medical Diagnosis B BKA    Referring Provider (PT) Aldean Baker, MD [facility], [place])    Onset Date/Surgical Date 10/25/20    Prior Therapy Previous OP PT prior to revision      Precautions   Precautions Fall      Balance Screen   Has the patient fallen in the past 6 months Yes    How many times? 1    Has the patient had a decrease  in activity level because of a fear of falling?  No    Is the patient reluctant to leave their home because of a fear of falling?  No      Home [employer]ist information centre managernvironment   Living Environment Private residence    Research officer, trade unionLiving Arrangements Parent;Other relatives    Available Help at Discharge Family    Type of Home House    Home Access Stairs to enter;Ramped entrance    Entrance Stairs-Number of Steps 4    Entrance Stairs-Rails Right    Home Layout One level    Home Equipment Walker - 4 wheels;Shower seat;Wheelchair - manual;Cane - single point   tripod cane     Prior Function   Level of Independence Independent with household mobility with device;Requires assistive device for independence   needs help shower set up, laundry, meals   Leisure hiking, biking, driving, fishing, swimming      Cognition   Overall Cognitive Status Within Functional Limits for tasks assessed      Sensation   Light Touch Appears Intact    Hot/Cold  Appears Intact      Coordination   Gross Motor Movements are Fluid and Coordinated Yes   in LEs   Fine Motor Movements are Fluid and Coordinated Yes      ROM / Strength   AROM / PROM / Strength AROM;Strength      AROM   Overall AROM  Within functional limits for tasks performed      Strength   Overall Strength Within functional limits for tasks performed    Overall Strength Comments Some slight weakness in L knee ext and hip abd      Transfers   Transfers Sit to Stand;Stand to Sit    Sit to Stand 6: Modified independent (Device/Increase time)    Five time sit to stand comments  18.09 secs with LUE support    Stand to Sit 6: Modified independent (Device/Increase time)    Comments Does rely on LUE and somewhat pushes back on chair with LEs for support      Ambulation/Gait   Ambulation/Gait Yes    Ambulation/Gait Assistance 5: Supervision    Ambulation/Gait Assistance Details Pt ambulatory with tripod cane during eval with cues for upright posture, equal step length and improving glute activation during stance for less antalgic gait pattern.    Ambulation Distance (Feet) 100 Feet    Assistive device Straight cane;Prostheses   tripod cane   Gait Pattern Step-through pattern;Decreased stride length;Antalgic;Trendelenburg;Lateral hip instability;Trunk flexed    Ambulation Surface Level;Indoor    Gait velocity 12.64 secs=2.59 ft/sec with tripod cane    Stairs Yes    Stairs Assistance 4: Min guard;4: Min assist    Stairs Assistance Details (indicate cue type and reason) Performed stairs as he does at home, ascending with R rail (R UE on rail and LUE pulling on R rail) in step to pattern and descending with rail and needing support at R UE to descend safely.  Demonstrated using R rail with RUE and cane in LUE to ascend.  He did well with this manner and then had him try to descend sideways using BUEs on rail which he was able to do more independently.  Right now he is having somewhat support  him on RUE when descending.    Stair Management Technique One rail Right;Step to pattern;Sideways;Forwards;With cane    Number of Stairs 4   2 reps   Height of Stairs 6  Standardized Balance Assessment   Standardized Balance Assessment Timed Up and Go Test      Timed Up and Go Test   TUG Normal TUG    Normal TUG (seconds) 16.79   with tripod cane          Prosthetics Assessment - 04/18/21 1330      Prosthetics   Prosthetic Care Independent with Skin check;Proper wear schedule/adjustment;Proper weight-bearing schedule/adjustment    Prosthetic Care Dependent with Correct ply sock adjustment    Prosthetic Care Comments  Pt is wearing prosthesis most of his awake hours (11am-6/7 pm) without issues however is not as mobile so endurance is an issue.    Donning prosthesis  Modified independent (Device/Increase time)    Doffing prosthesis  Modified independent (Device/Increase time)    Current prosthetic wear tolerance (days/week)  daily    Current prosthetic wear tolerance (#hours/day)  7-8 hours per day    Current prosthetic weight-bearing tolerance (hours/day)  can WB for up to 20 mins at a time per pt report    Edema no edema noted    Residual limb condition  new R revision healing well with no areas of breakdown, good skin color and hair growth.    Prosthesis Description silicone liners with pin lock    K code/activity level with prosthetic use  K3                     Objective measurements completed on examination: See above findings.               PT Education - 04/18/21 1513    Education Details Educated on POC, goals, evaluation results, getting OT involved with new UE prosthesis    Person(s) Educated Patient;Parent(s)    Methods Explanation    Comprehension Verbalized understanding            PT Short Term Goals - 04/18/21 1524      PT SHORT TERM GOAL #1   Title Pt will be independent with prosthetic care and management to manage prosthesis  (including sock ply adjustment) on his own. (Target Date:  Following 3rd visit)    Baseline currently needs assist to determine proper sock ply    Time 1    Period Months    Status New      PT SHORT TERM GOAL #2   Title Pt will negotiate up/down stairs with B prostheses and cane at S level in order to indicate independence with home entry/exit.    Baseline Needs min A at this time    Time 1    Period Months      PT SHORT TERM GOAL #3   Title Pt will initiate HEP for high level balance and strengthening and report compliance.    Baseline dependent at this time    Time 1    Period Months    Status New      PT SHORT TERM GOAL #4   Title Will assess DGI and set LTG to reflect progress.    Baseline did not have time to assess    Time 1    Period Months    Status New             PT Long Term Goals - 04/18/21 1526      PT LONG TERM GOAL #1   Title Pt will tolerate wearing prostheses all awake hours with no skin issues for improved function. (Target Date:  by 11th visit)  Baseline 7-8 hours per day with cues for correct ply sock adjustment    Time 2    Period Months    Status New      PT LONG TERM GOAL #2   Title Pt will improve gait speed to >/=3.00 ft/sec with B prostheses and without device in order to indicate safe community ambulation.    Baseline 2.59 ft/sec with tripod cane and B prostheses    Time 2    Period Months    Status New      PT LONG TERM GOAL #3   Title Pt will improve TUG to </=13.5 secs without AD in order to indicate dec fall risk.    Baseline 16.79 secs with tripod cane    Time 2    Period Months    Status New      PT LONG TERM GOAL #4   Title Pt will negotiate up/down 4 steps with LRAD at mod I level in order to enter/exit home safely.    Baseline min A    Time 2    Period Months    Status New      PT LONG TERM GOAL #5   Title Pt will negotiate up/down curb and ramp, ambulate 1000' w/ LRAD in order to improve community access.     Baseline does not negotiate ramp at this time, limited community mobilty due to poor endurance.    Time 2    Period Months    Status New      Additional Long Term Goals   Additional Long Term Goals Yes      PT LONG TERM GOAL #6   Title Pt will negotiate over uneven terrain (grass, mulch, etc) with LRAD (trekking poles if needed) x 500' in order to indicate safe return to hiking.    Baseline not hiking at this time    Time 2    Period Months    Status New                  Plan - 04/18/21 1514    Clinical Impression Statement Pt is familiar to this clinic with history of bilateral Transtibial Amputations. 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 He moved from [place] to [place] 04/19/2020.   Also note hx of complete heart block, pacemaker 2018, Tricuspid valve replacements 2018, and Hep C.  He now returns following R BKA revision due to wound (Oct 2021).  He was delayed in returning due to prolonged wound healing and waiting on new prostheses.  He also reports he is getting new UE prosthesis to be able to lift weights.  Upon PT evaluation, note gait speed of 2.59 ft/sec with tripod cane, indicative of dec community ambulation, 5TSS time of 18.09 secs indicative of dec functional strength and TUG time of 16.79 secs indicative of high fall risk.  Pt will benefit from skilled OP neuro PT in order to address remaining deficits.    Personal Factors and Comorbidities Comorbidity 3+;Social Background    Comorbidities see above    Examination-Activity Limitations Carry;Caring for Others;Locomotion Level;Lift;Dressing;Bathing;Squat;Stairs;Stand    Examination-Participation Restrictions Community Activity;Meal Prep;Driving;Laundry;Cleaning    Stability/Clinical Decision Making Evolving/Moderate complexity    Clinical Decision Making Moderate    Rehab Potential Excellent    PT Frequency 1x / week   1x/wk for 3  weeks then 2x/wk for 4 weeks   PT Duration 3 weeks  PT Treatment/Interventions ADLs/Self Care Home Management;Aquatic Therapy;DME Instruction;Gait training;Stair training;Functional mobility training;Therapeutic activities;Therapeutic exercise;Balance training;Neuromuscular re-education;Patient/family education;Prosthetic Training;Passive range of motion;Energy conservation;Vestibular    PT Next Visit Plan DGI (I think he could do parts of it without device, but may need it for some tasks)-update goal if needed, initiate HEP for high level balance and strength, stairs, ramp, curb, outdoors when able. Continue to educate on proper ply sock adjustment.    Consulted and Agree with Plan of Care Patient;Family member/caregiver    Family Member Consulted mother           Patient will benefit from skilled therapeutic intervention in order to improve the following deficits and impairments:  Abnormal gait,Cardiopulmonary status limiting activity,Decreased activity tolerance,Decreased balance,Decreased endurance,Decreased knowledge of precautions,Decreased mobility,Decreased strength,Difficulty walking,Impaired perceived functional ability,Impaired UE functional use,Postural dysfunction,Prosthetic Dependency  Visit Diagnosis: Muscle weakness (generalized)  Unsteadiness on feet  Other abnormalities of gait and mobility  Abnormal posture     Problem List Patient Active Problem List   Diagnosis Date Noted  . 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 Dehiscence of amputation stump (HCC)   . Pacemaker - MDT 09/09/2020  . Wound of left leg 08/24/2020  . Fatigue 07/31/2020  . 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 Educated about COVID-19 virus infection 05/11/2020  . S/P bilateral below knee amputation (HCC) 05/09/2020  . 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 History of bacterial endocarditis 05/02/2020  . 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   . Complete heart block (HCC) 12/11/2017  . 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 Opioid use disorder, moderate, in early remission (HCC) 12/11/2017  . Depression 12/11/2017    Harriet Butte, PT, MPT [facility] [address] [place], [place], [address] Phone: [phone]   Fax:  [phone] 04/18/21, 3:33 PM  Name: Wesley Fuentes MRN: [id number] Date of Birth: 07/16/1976

## 2021-04-25 ENCOUNTER — Other Ambulatory Visit: Payer: Self-pay

## 2021-04-25 NOTE — Telephone Encounter (Signed)
Patient LVM on nurse line stating he has left messages in regards to some medications. I do not see anything in his chart. I attempted to call patient back to see what medications he may need. LVM on mothers phone and his.

## 2021-05-01 ENCOUNTER — Ambulatory Visit: Payer: Medicaid Other | Admitting: Physical Therapy

## 2021-05-01 MED ORDER — MIRTAZAPINE 15 MG PO TABS: 15.0000 mg | ORAL_TABLET | Freq: Every day | ORAL | 2 refills | Status: DC

## 2021-05-01 NOTE — Telephone Encounter (Signed)
Patient calls nurse line requesting a refill on Mirtazapine. Patient reports he thinks PCP was going to up the dose. Patient reports he is taking 15mg  now, however not working well. Patient states he has stopped taking Zoloft all together. Please advise on dosage change and refill.

## 2021-05-01 NOTE — Telephone Encounter (Signed)
Spoke with PCP.  He called in 15mg  but gave #90, pt can take 30mg  if desired per PCP.    Informed mom.  Pt will be taking 30mg , so next script will need to reflect those directions so that insuracnce will pay for refill.  , CMA

## 2021-05-01 NOTE — Telephone Encounter (Signed)
Mom called back because pt is out of medication.  There was an increased mentioned at last visit.  PCP texted paged. Jone Baseman, CMA

## 2021-05-08 ENCOUNTER — Ambulatory Visit: Payer: Medicaid Other | Attending: Orthopedic Surgery

## 2021-05-08 DIAGNOSIS — R2681 Unsteadiness on feet: Secondary | ICD-10-CM | POA: Insufficient documentation

## 2021-05-08 DIAGNOSIS — R293 Abnormal posture: Secondary | ICD-10-CM | POA: Insufficient documentation

## 2021-05-08 DIAGNOSIS — M6281 Muscle weakness (generalized): Secondary | ICD-10-CM | POA: Insufficient documentation

## 2021-05-08 DIAGNOSIS — R2689 Other abnormalities of gait and mobility: Secondary | ICD-10-CM | POA: Insufficient documentation

## 2021-05-09 ENCOUNTER — Ambulatory Visit: Payer: Medicaid Other | Admitting: Rehabilitation

## 2021-05-09 ENCOUNTER — Other Ambulatory Visit: Payer: Self-pay

## 2021-05-09 DIAGNOSIS — R293 Abnormal posture: Secondary | ICD-10-CM

## 2021-05-09 DIAGNOSIS — R2681 Unsteadiness on feet: Secondary | ICD-10-CM

## 2021-05-09 DIAGNOSIS — R2689 Other abnormalities of gait and mobility: Secondary | ICD-10-CM | POA: Diagnosis present

## 2021-05-09 DIAGNOSIS — M6281 Muscle weakness (generalized): Secondary | ICD-10-CM

## 2021-05-09 NOTE — Patient Instructions (Signed)
Access Code: [email or web address] URL: [email or web address] Date: 05/09/2021 Prepared by: Harriet Butte  Exercises Lateral Weight Shift with Parallel Bars (AKA) - 2 x daily - 7 x weekly - 1 sets - 10 reps Standing Hip Abduction with Counter Support - 2 x daily - 7 x weekly - 1 sets - 10 reps Standing Marching - 2 x daily - 7 x weekly - 1 sets - 10 reps

## 2021-05-09 NOTE — Therapy (Signed)
[facility] [address] [place], [place], [address] Phone: [phone]   Fax:  [phone]  Physical Therapy Treatment  Patient Details  Name: Wesley Fuentes MRN: [id number] Date of Birth: [birth year] Referring Provider (PT): Aldean Baker, MD [facility] [place], [place])   Encounter Date: 05/09/2021   PT End of Session - 05/09/21 1231    Visit Number 2    Number of Visits 11    Authorization Type MCD-approval dates 5/6-5/26 (3 visits)    Authorization - Visit Number 1    Authorization - Number of Visits 3    PT Start Time 1104    PT Stop Time 1153    PT Time Calculation (min) 49 min    Activity Tolerance Patient tolerated treatment well           Past Medical History:  Diagnosis Date  . CHB (complete heart block) (HCC)   . Depression 12/11/2017  . Endocarditis of tricuspid valve   . Hepatitis C   . History of complete heart block 12/11/2017  . 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 MRSA (methicillin resistant Staphylococcus aureus)   . Opioid use disorder, moderate, in early remission (HCC) 12/11/2017  . Presence of permanent cardiac pacemaker    [employer]    Past Surgical History:  Procedure Laterality Date  . BELOW KNEE LEG AMPUTATION Bilateral   . PACEMAKER REVISION     moved from chest to abd.   . Permanent pacemaker placement     On 11/24/17-had [facility] Jude dual-chamber permanent pacemaker placed.  . right hand amputation    . STUMP REVISION Right 10/25/2020   Procedure: REVISION RIGHT BELOW KNEE AMPUTATION;  Surgeon: [name], MD;  Location: [facility] OR;  Service: Orthopedics;  Laterality: Right;  . Tricuspid valve replacement     On 11/17/17-replacement of tricuspid valve with a 29 mm Medtronic Mosaic mitral prosthesis.    There were no vitals filed for this visit.                      [facility] Adult PT Treatment/Exercise - 05/09/21 1115       Transfers   Transfers Sit to Stand;Stand to Sit    Sit to Stand 6: Modified independent (Device/Increase time)    Stand to Sit 6: Modified independent (Device/Increase time)      Ambulation/Gait   Ambulation/Gait Yes    Ambulation/Gait Assistance 5: Supervision;4: Min guard    Ambulation/Gait Assistance Details Pt ambulatory with tripod cane today reporting issues with L BKA having pain at lateral aspect of knee and inferior/medial aspect of knee.  He notes that Brett Canales (at [place]) had made some adjustments as his limb does turn in that direction so adjusted to accommodate.  Upon further assessment, note a medial pylon lean but also he needed to add socks to L side as well.  Added single ply and still noted pelvic tilt, therefore added 3ply with marked improvement in alignment and feel/comfort of leg.  He would like to get rid of extra "bar" that was added to help with leg lean and recommend that he go over to [facility] today to see if Brett Canales could adjust.  Pt and mother verbalize understanding.    Ambulation Distance (Feet) 150 Feet   61' then 17', several short bouts during session   Assistive device Straight cane;Prostheses    Gait Pattern Step-through pattern;Decreased stride length;Antalgic;Trendelenburg;Lateral hip instability;Trunk flexed    Ambulation  Surface Level;Indoor    Stairs Yes    Stairs Assistance 4: Min guard    Stairs Assistance Details (indicate cue type and reason) He was able to ascend stairs today with single L rail in forward step to pattern with min cues for leading with RLE.  When descending he attempted to do forwards with L sided rail but PT provided light support due to weakness in LEs.    Stair Management Technique One rail Left;Alternating pattern;Step to pattern;Forwards    Number of Stairs 4    Height of Stairs 6      Standardized Balance Assessment   Standardized Balance Assessment Dynamic Gait Index      Dynamic Gait Index   Level Surface Mild Impairment   7.53  secs   Change in Gait Speed Mild Impairment    Gait with Horizontal Head Turns Moderate Impairment    Gait with Vertical Head Turns Mild Impairment    Gait and Pivot Turn Mild Impairment    Step Over Obstacle Moderate Impairment    Step Around Obstacles Mild Impairment    Steps Moderate Impairment    Total Score 13      Exercises   Exercises Other Exercises    Other Exercises  See pt instructions on strengthening exercises performed.      Prosthetics   Prosthetic Care Comments  See gait section regarding issues with L BKA, some of which were corrected with correct ply sock adjustment.    Current prosthetic wear tolerance (days/week)  daily    Current prosthetic wear tolerance (#hours/day)  4-5 hours consistently (sometimes taking a 30 min break mid day)    Current prosthetic weight-bearing tolerance (hours/day)  can WB for up to 20 mins at a time per pt report    Edema no edema noted    Residual limb condition  no issues per pt report    Education Provided Residual limb care;Correct ply sock adjustment;Proper wear schedule/adjustment;Proper weight-bearing schedule/adjustment    Person(s) Educated Patient    Education Method Explanation;Verbal cues    Education Method Verbalized understanding;Needs further instruction                    PT Short Term Goals - 05/09/21 1235      PT SHORT TERM GOAL #1   Title Pt will be independent with prosthetic care and management to manage prosthesis (including sock ply adjustment) on his own. (Target Date:  Following 3rd visit)    Baseline currently needs assist to determine proper sock ply    Time 1    Period Months    Status New      PT SHORT TERM GOAL #2   Title Pt will negotiate up/down stairs with B prostheses and cane at S level in order to indicate independence with home entry/exit.    Baseline Needs min A at this time    Time 1    Period Months      PT SHORT TERM GOAL #3   Title Pt will initiate HEP for high level  balance and strengthening and report compliance.    Baseline initiated 05/09/21    Time 1    Period Months    Status Achieved      PT SHORT TERM GOAL #4   Title Will assess DGI and set LTG to reflect progress.    Baseline 13/24    Time 1    Period Months    Status Achieved  PT Long Term Goals - 05/09/21 1236      PT LONG TERM GOAL #1   Title Pt will tolerate wearing prostheses all awake hours with no skin issues for improved function. (Target Date:  by 11th visit)    Baseline 7-8 hours per day with cues for correct ply sock adjustment    Time 2    Period Months    Status New      PT LONG TERM GOAL #2   Title Pt will improve gait speed to >/=3.00 ft/sec with B prostheses and without device in order to indicate safe community ambulation.    Baseline 2.59 ft/sec with tripod cane and B prostheses    Time 2    Period Months    Status New      PT LONG TERM GOAL #3   Title Pt will improve TUG to </=13.5 secs without AD in order to indicate dec fall risk.    Baseline 16.79 secs with tripod cane    Time 2    Period Months    Status New      PT LONG TERM GOAL #4   Title Pt will negotiate up/down 4 steps with LRAD at mod I level in order to enter/exit home safely.    Baseline min A    Time 2    Period Months    Status New      PT LONG TERM GOAL #5   Title Pt will negotiate up/down curb and ramp, ambulate 1000' w/ LRAD in order to improve community access.    Baseline does not negotiate ramp at this time, limited community mobilty due to poor endurance.    Time 2    Period Months    Status New      Additional Long Term Goals   Additional Long Term Goals Yes      PT LONG TERM GOAL #6   Title Pt will negotiate over uneven terrain (grass, mulch, etc) with LRAD (trekking poles if needed) x 500' in order to indicate safe return to hiking.    Baseline not hiking at this time    Time 2    Period Months    Status New      PT LONG TERM GOAL #7   Title Pt will  improve DGI to >/=17/24 in order to indicate dec fall risk.    Baseline 13/24    Time 2    Period Months    Status New                 Plan - 05/09/21 1232    Clinical Impression Statement Skilled session focused on formal balance assessment with DGI.  Pt scored 13/24 indicative of high fall risk, discussed results with pt and mother.  Also spent good amount of time during session assessing gait and the need to add socks to L BKA as he does have a slight medial pylon lean to adjust for leg, however was markedly improved when 3 ply socks added.  Recommended he pop over to [facility] after this session to see if Brett CanalesSteve could adjust the lean.  Also provided standing strengthening for pt during session as part of L LE issue seems to be hip weakness as well.  Pts SaO2 did drop to 87% during exercises but with rest increased to 94% with HR remaining in 70-80's.    Personal Factors and Comorbidities Comorbidity 3+;Social Background    Comorbidities see above    Examination-Activity Limitations Carry;Caring for  Others;Locomotion Level;Lift;Dressing;Bathing;Squat;Stairs;Stand    Examination-Participation Restrictions Community Activity;Meal Prep;Driving;Laundry;Cleaning    Stability/Clinical Decision Making Evolving/Moderate complexity    Rehab Potential Excellent    PT Frequency 1x / week   1x/wk for 3 weeks then 2x/wk for 4 weeks   PT Duration 3 weeks    PT Treatment/Interventions ADLs/Self Care Home Management;Aquatic Therapy;DME Instruction;Gait training;Stair training;Functional mobility training;Therapeutic activities;Therapeutic exercise;Balance training;Neuromuscular re-education;Patient/family education;Prosthetic Training;Passive range of motion;Energy conservation;Vestibular    PT Next Visit Plan Did he get to [facility] for adjustment, add to HEP for high level balance and strength, stairs, ramp, curb, outdoors when able. Continue to educate on proper ply sock adjustment.    Consulted and Agree  with Plan of Care Patient;Family member/caregiver    Family Member Consulted mother           Patient will benefit from skilled therapeutic intervention in order to improve the following deficits and impairments:  Abnormal gait,Cardiopulmonary status limiting activity,Decreased activity tolerance,Decreased balance,Decreased endurance,Decreased knowledge of precautions,Decreased mobility,Decreased strength,Difficulty walking,Impaired perceived functional ability,Impaired UE functional use,Postural dysfunction,Prosthetic Dependency  Visit Diagnosis: Muscle weakness (generalized)  Unsteadiness on feet  Other abnormalities of gait and mobility  Abnormal posture     Problem List Patient Active Problem List   Diagnosis Date Noted  . 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 Dehiscence of amputation stump (HCC)   . Pacemaker - MDT 09/09/2020  . Wound of left leg 08/24/2020  . Fatigue 07/31/2020  . 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 Educated about COVID-19 virus infection 05/11/2020  . S/P bilateral below knee amputation (HCC) 05/09/2020  . 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 History of bacterial endocarditis 05/02/2020  . 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 Complete heart block (HCC) 12/11/2017  . 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 Opioid use disorder, moderate, in early remission (HCC) 12/11/2017  . Depression 12/11/2017    Harriet Butte, PT, MPT [facility] [address] [place], [place], [address] Phone: [phone]   Fax:  [phone] 05/09/21, 12:40 PM  Name: CALE BETHARD MRN: [id number] Date of Birth: 02-13-1976

## 2021-05-15 ENCOUNTER — Encounter: Payer: Self-pay | Admitting: Physical Therapy

## 2021-05-15 ENCOUNTER — Other Ambulatory Visit: Payer: Self-pay

## 2021-05-15 ENCOUNTER — Ambulatory Visit: Payer: Medicaid Other | Admitting: Physical Therapy

## 2021-05-15 DIAGNOSIS — R2689 Other abnormalities of gait and mobility: Secondary | ICD-10-CM

## 2021-05-15 DIAGNOSIS — M6281 Muscle weakness (generalized): Secondary | ICD-10-CM | POA: Diagnosis not present

## 2021-05-15 DIAGNOSIS — R2681 Unsteadiness on feet: Secondary | ICD-10-CM

## 2021-05-15 DIAGNOSIS — R293 Abnormal posture: Secondary | ICD-10-CM

## 2021-05-15 NOTE — Therapy (Signed)
[facility] [address] [place], [place], [address] Phone: [phone]   Fax:  [phone]  Physical Therapy Treatment  Patient Details  Name: Wesley Fuentes MRN: [id number] Date of Birth: 1976-05-30 Referring Provider (PT): Aldean Baker, MD [facility] [place], [place])   Encounter Date: 05/15/2021   PT End of Session - 05/15/21 1240    Visit Number 3    Number of Visits 11    Authorization Type MCD-approval dates 5/6-5/26 (3 visits)    Authorization - Visit Number 2    Authorization - Number of Visits 3    PT Start Time 1234    PT Stop Time 1315    PT Time Calculation (min) 41 min    Activity Tolerance Patient tolerated treatment well    Behavior During Therapy [facility] for tasks assessed/performed           Past Medical History:  Diagnosis Date  . CHB (complete heart block) (HCC)   . Depression 12/11/2017  . Endocarditis of tricuspid valve   . Hepatitis C   . History of complete heart block 12/11/2017  . 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 MRSA (methicillin resistant Staphylococcus aureus)   . Opioid use disorder, moderate, in early remission (HCC) 12/11/2017  . Presence of permanent cardiac pacemaker    [employer]    Past Surgical History:  Procedure Laterality Date  . BELOW KNEE LEG AMPUTATION Bilateral   . PACEMAKER REVISION     moved from chest to abd.   . Permanent pacemaker placement     On 11/24/17-had [facility] Jude dual-chamber permanent pacemaker placed.  . right hand amputation    . STUMP REVISION Right 10/25/2020   Procedure: REVISION RIGHT BELOW KNEE AMPUTATION;  Surgeon: [name], MD;  Location: [facility] OR;  Service: Orthopedics;  Laterality: Right;  . Tricuspid valve replacement     On 11/17/17-replacement of tricuspid valve with a 29 mm Medtronic Mosaic mitral prosthesis.    There were no vitals filed for this visit.   Subjective Assessment -  05/15/21 1236    Subjective Has new fiberglass with casting material on it for left LE. Goes next week for follow up to get new socket at that time. Trying to get into see Brett Canales this week to have fiberglass socket adjusted because it is digging into the back of his knee when he sits down. Also did a height adjustment on left side.    Patient is accompained by: Family member    How long can you stand comfortably? 15-20 mins    How long can you walk comfortably? 20 mins    Patient Stated Goals "I want to be able to hike, bike, drive."    Currently in Pain? No/denies    Pain Score 0-No pain                  [facility] Adult PT Treatment/Exercise - 05/15/21 1241      Transfers   Transfers Sit to Stand;Stand to Sit    Sit to Stand 6: Modified independent (Device/Increase time)    Stand to Sit 6: Modified independent (Device/Increase time)      Ambulation/Gait   Ambulation/Gait Yes    Ambulation/Gait Assistance 5: Supervision;4: Min guard    Ambulation/Gait Assistance Details gait working on correct sock ply fit on left prosthetic. 1st lap with no AD after going from 0>3 ply with pt noted to still be sitting too low in socket.  2cd lap with cane after changing to 5 ply sock with patella still too low. Added 3 more ply for total of 8 ply for 3rd laps with pt reported prosthesis too tight. Pt most lilely needs 6-7 ply, however do not have any singles with him. Pt to continue to work on adjustements of socks at home. Saxo2 91% after all 3 gait reps, recovered to >/=93% with in ~2-3 minutes of seated rest break.    Ambulation Distance (Feet) 115 Feet   x2, 80 x1, plus around gym with session   Assistive device Straight cane;Prostheses;None    Gait Pattern Step-through pattern;Decreased stride length;Antalgic;Trendelenburg;Lateral hip instability;Trunk flexed    Ambulation Surface Level;Indoor      High Level Balance   High Level Balance Activities Side stepping;Marching forwards;Backward walking     High Level Balance Comments with single UE support on counter for 3 lap each, min guard assist for safety. added to HEP. VOHY0 decr to 86% with counter top balance ex's, recovered to >/=90% within 1 minute of sitting down.      Prosthetics   Prosthetic Care Comments  Prosthetist making new socket for left LE with pt currently in temporary fiberglass socket. Pt does report it digging into back of knee. Attempted to see Brett Canales to have this addressed, however Brett Canales out of office. Pt is going to [place] over weekend. Advised him to call and see if one of the other prosthetist can see him so not to have it digging into back of his knee while out of town. Also discussed differenct styles of suspension at pt request. Reports they had tried to put him in suction with inner liner/pin lock liner over it and he did not like this way. Discussed the benefits and cons of both styles. Pt perfers the pin lock only at this time.    Current prosthetic wear tolerance (days/week)  daily    Current prosthetic wear tolerance (#hours/day)  4-5 hours consistently (sometimes taking a 30 min break mid day)    Residual limb condition  no issues per pt report    Education Provided Residual limb care;Correct ply sock adjustment;Proper wear schedule/adjustment;Proper weight-bearing schedule/adjustment    Person(s) Educated Patient;Parent(s)   mom   Education Method Explanation;Demonstration;Verbal cues;Handout    Education Method Verbalized understanding;Returned demonstration;Verbal cues required;Needs further instruction                  PT Education - 05/15/21 1440    Education Details additions to HEP; see prosthetic education section    Person(s) Educated Patient;Parent(s)    Methods Explanation;Demonstration;Verbal cues;Handout    Comprehension Verbalized understanding;Returned demonstration;Verbal cues required;Need further instruction            PT Short Term Goals - 05/09/21 1235      PT SHORT  TERM GOAL #1   Title Pt will be independent with prosthetic care and management to manage prosthesis (including sock ply adjustment) on his own. (Target Date:  Following 3rd visit)    Baseline currently needs assist to determine proper sock ply    Time 1    Period Months    Status New      PT SHORT TERM GOAL #2   Title Pt will negotiate up/down stairs with B prostheses and cane at S level in order to indicate independence with home entry/exit.    Baseline Needs min A at this time    Time 1    Period Months      PT  SHORT TERM GOAL #3   Title Pt will initiate HEP for high level balance and strengthening and report compliance.    Baseline initiated 05/09/21    Time 1    Period Months    Status Achieved      PT SHORT TERM GOAL #4   Title Will assess DGI and set LTG to reflect progress.    Baseline 13/24    Time 1    Period Months    Status Achieved             PT Long Term Goals - 05/09/21 1236      PT LONG TERM GOAL #1   Title Pt will tolerate wearing prostheses all awake hours with no skin issues for improved function. (Target Date:  by 11th visit)    Baseline 7-8 hours per day with cues for correct ply sock adjustment    Time 2    Period Months    Status New      PT LONG TERM GOAL #2   Title Pt will improve gait speed to >/=3.00 ft/sec with B prostheses and without device in order to indicate safe community ambulation.    Baseline 2.59 ft/sec with tripod cane and B prostheses    Time 2    Period Months    Status New      PT LONG TERM GOAL #3   Title Pt will improve TUG to </=13.5 secs without AD in order to indicate dec fall risk.    Baseline 16.79 secs with tripod cane    Time 2    Period Months    Status New      PT LONG TERM GOAL #4   Title Pt will negotiate up/down 4 steps with LRAD at mod I level in order to enter/exit home safely.    Baseline min A    Time 2    Period Months    Status New      PT LONG TERM GOAL #5   Title Pt will negotiate up/down  curb and ramp, ambulate 1000' w/ LRAD in order to improve community access.    Baseline does not negotiate ramp at this time, limited community mobilty due to poor endurance.    Time 2    Period Months    Status New      Additional Long Term Goals   Additional Long Term Goals Yes      PT LONG TERM GOAL #6   Title Pt will negotiate over uneven terrain (grass, mulch, etc) with LRAD (trekking poles if needed) x 500' in order to indicate safe return to hiking.    Baseline not hiking at this time    Time 2    Period Months    Status New      PT LONG TERM GOAL #7   Title Pt will improve DGI to >/=17/24 in order to indicate dec fall risk.    Baseline 13/24    Time 2    Period Months    Status New                 Plan - 05/15/21 1241    Clinical Impression Statement Today's skilled session continued to focus on prosthetic management, gait with bil prostheses and balance with bil prosthesis. Improved left prosthesis fit with additions of sock plys. Added new ex's to HEP with no issues reported with performance in session. The pt is progressing toward goals and should benefit from continued PT to progress  toward unmet goals.    Personal Factors and Comorbidities Comorbidity 3+;Social Background    Comorbidities see above    Examination-Activity Limitations Carry;Caring for Others;Locomotion Level;Lift;Dressing;Bathing;Squat;Stairs;Stand    Examination-Participation Restrictions Community Activity;Meal Prep;Driving;Laundry;Cleaning    Stability/Clinical Decision Making Evolving/Moderate complexity    Rehab Potential Excellent    PT Frequency 1x / week   1x/wk for 3 weeks then 2x/wk for 4 weeks   PT Duration 3 weeks    PT Treatment/Interventions ADLs/Self Care Home Management;Aquatic Therapy;DME Instruction;Gait training;Stair training;Functional mobility training;Therapeutic activities;Therapeutic exercise;Balance training;Neuromuscular re-education;Patient/family education;Prosthetic  Training;Passive range of motion;Energy conservation;Vestibular    PT Next Visit Plan new socket or still in fiberglass one? continue to work on gait/barriers/balance with bil prostheses with decreased UE support.    PT Home Exercise Plan Access Code: [email or web address]    Consulted and Agree with Plan of Care Patient;Family member/caregiver    Family Member Consulted mother           Patient will benefit from skilled therapeutic intervention in order to improve the following deficits and impairments:  Abnormal gait,Cardiopulmonary status limiting activity,Decreased activity tolerance,Decreased balance,Decreased endurance,Decreased knowledge of precautions,Decreased mobility,Decreased strength,Difficulty walking,Impaired perceived functional ability,Impaired UE functional use,Postural dysfunction,Prosthetic Dependency  Visit Diagnosis: Muscle weakness (generalized)  Unsteadiness on feet  Other abnormalities of gait and mobility  Abnormal posture     Problem List Patient Active Problem List   Diagnosis Date Noted  . 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 Dehiscence of amputation stump (HCC)   . Pacemaker - MDT 09/09/2020  . Wound of left leg 08/24/2020  . Fatigue 07/31/2020  . 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 Educated about COVID-19 virus infection 05/11/2020  . S/P bilateral below knee amputation (HCC) 05/09/2020  . 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 History of bacterial endocarditis 05/02/2020  . 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 Complete heart block (HCC) 12/11/2017  . 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 Opioid use disorder, moderate, in early remission (HCC) 12/11/2017  . Depression 12/11/2017    Sallyanne KusterKathy Kira Hartl, PTA, [facility] [address], [address] [place], [place] [id number] [phone] 05/15/21, 2:45 PM   Name: Wesley Fuentes MRN:  [id number] Date of Birth: [birth year]

## 2021-05-17 ENCOUNTER — Telehealth: Payer: Self-pay | Admitting: Physician Assistant

## 2021-05-17 NOTE — Telephone Encounter (Signed)
Last 2 ov notes faxed to [facility] [phone]

## 2021-05-29 ENCOUNTER — Ambulatory Visit: Payer: Medicaid Other

## 2021-05-30 ENCOUNTER — Other Ambulatory Visit: Payer: Self-pay | Admitting: Family Medicine

## 2021-05-30 ENCOUNTER — Telehealth: Payer: Self-pay

## 2021-05-30 MED ORDER — VARENICLINE TARTRATE 0.5 MG X 11 & 1 MG X 42 PO MISC: ORAL | 0 refills | Status: DC

## 2021-05-30 NOTE — Progress Notes (Signed)
Chantix starter pack sent to patient's pharmacy per their request.

## 2021-05-30 NOTE — Telephone Encounter (Signed)
Patient calls nurse line requesting rx for Chantix. Patient reports that he has taken this medication in the past to help with smoking cessation and does not have any side effects on medication.   Please advise if medication can be sent into pharmacy or if patient needs to schedule appointment.   Veronda Prude, RN

## 2021-05-31 ENCOUNTER — Ambulatory Visit: Payer: Medicaid Other | Attending: Orthopedic Surgery

## 2021-05-31 ENCOUNTER — Other Ambulatory Visit: Payer: Self-pay

## 2021-05-31 DIAGNOSIS — R2681 Unsteadiness on feet: Secondary | ICD-10-CM | POA: Insufficient documentation

## 2021-05-31 DIAGNOSIS — M6281 Muscle weakness (generalized): Secondary | ICD-10-CM | POA: Diagnosis present

## 2021-05-31 DIAGNOSIS — R293 Abnormal posture: Secondary | ICD-10-CM | POA: Diagnosis present

## 2021-05-31 DIAGNOSIS — R2689 Other abnormalities of gait and mobility: Secondary | ICD-10-CM | POA: Insufficient documentation

## 2021-05-31 NOTE — Therapy (Signed)
[facility] [address] [place], [place], [address] Phone: [phone]   Fax:  [phone]  Physical Therapy Treatment  Patient Details  Name: Wesley Fuentes MRN: [id number] Date of Birth: 12-10-1976 Referring Provider (PT): Meridee Score, MD [facility], [place])   Encounter Date: 05/31/2021   PT End of Session - 05/31/21 1324    Visit Number 4    Number of Visits 11    Authorization Type MCD-approval dates 5/6-5/26 (3 visits)    Authorization - Visit Number 3    Authorization - Number of Visits 3    PT Start Time 1320    PT Stop Time 1359    PT Time Calculation (min) 39 min    Activity Tolerance Patient tolerated treatment well    Behavior During Therapy [facility] for tasks assessed/performed           Past Medical History:  Diagnosis Date  . CHB (complete heart block) ([place])   . Depression 12/11/2017  . Endocarditis of tricuspid valve   . Hepatitis C   . History of complete heart block 12/11/2017  . 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 MRSA (methicillin resistant Staphylococcus aureus)   . Opioid use disorder, moderate, in early remission ([place]) 12/11/2017  . Presence of permanent cardiac pacemaker    [employer]    Past Surgical History:  Procedure Laterality Date  . BELOW KNEE LEG AMPUTATION Bilateral   . PACEMAKER REVISION     moved from chest to abd.   . Permanent pacemaker placement     On 11/24/17-had [facility] Jude dual-chamber permanent pacemaker placed.  . right hand amputation    . STUMP REVISION Right 10/25/2020   Procedure: REVISION RIGHT BELOW KNEE AMPUTATION;  Surgeon: Newt Minion, MD;  Location: [facility];  Service: Orthopedics;  Laterality: Right;  . Tricuspid valve replacement     On 11/17/17-replacement of tricuspid valve with a 29 mm Medtronic Mosaic mitral prosthesis.    There were no vitals filed for this visit.   Subjective Assessment - 05/31/21  1325    Subjective Pt got his new prosthesis. Had to cancel Tuesday as legs were really sore from riding in car to [place]. Reports that new prosthesis does feel tighter.    Patient is accompained by: Family member    How long can you stand comfortably? 15-20 mins    How long can you walk comfortably? 20 mins    Patient Stated Goals "I want to be able to hike, bike, drive."    Currently in Pain? No/denies                             [facility] Adult PT Treatment/Exercise - 05/31/21 1330      Transfers   Transfers Sit to Stand;Stand to Sit    Sit to Stand 6: Modified independent (Device/Increase time)    Stand to Sit 6: Modified independent (Device/Increase time)      Ambulation/Gait   Ambulation/Gait Yes    Ambulation/Gait Assistance 5: Supervision;4: Min guard    Ambulation/Gait Assistance Details around in clinic during testing.    Ambulation Distance (Feet) 115 Feet    Assistive device Straight cane;Prostheses;None    Gait Pattern Step-through pattern;Trendelenburg    Ambulation Surface Level;Indoor    Gait velocity 10.44 sec with cane=0.36ms (3.14 ft/sec),  and 11.79 without AD=0.864m(2.78ft/sec)    Stairs Yes    Stairs  Assistance 5: Supervision;4: Min assist    [employer] Details (indicate cue type and reason) 8 steps with left rail only. Pt first reaching across with right arm as well with reciprocal pattern Had him try to preventing turning and only hold on left and used reciprocal/step-to pattern. Pt was close supervision. Trialed 4 steps with cane only but was min assist with step-to pattern.    Stair Management Technique One rail Left;Alternating pattern;Step to pattern;With cane    Number of Stairs 12    Height of Stairs 6      Neuro Re-ed    Neuro Re-ed Details  Gait over obstacle course with cane: weaving around 3 cones, reciprocal steps over 2 2x4" foam beams, stepping on airex and balancing then stepping off, walking over soft blue mat. Pt  was CGA/min assist at times especially when stepping on airex. Caught trailing foot once on foam beams and was cued to increase foot clearance. Improved with practice. In // bars: stepping on rockerboard positioned ant/posterior with 1 UE support x 5 each leg. More challenged with stepping up with LLE. PT provided some support at right forearm to help but also instructed pt to tighten left gluts more to stabilize as was leaning on bar. Standing on rockerboard without UE support trying to maintain board level with visual cues from mirror as well CGA x 30 sec, alternating shoulder flexion x 10 with verbal cues to engage core to help stabilize.      Prosthetics   Prosthetic Care Comments  PT reminded pt to be sure to wash liner daily to prevent skin irritation.    Current prosthetic wear tolerance (days/week)  daily    Current prosthetic wear tolerance (#hours/day)  6    Edema no pitting edemal noted but prosthesis feeling tight so advised to wear shrinker at night fot now to see if that will help some.    Residual limb condition  no issues noted on left when observed. Pt reported discomfort at left distal tibia but no redness noted. Pressure areas were in corrects spot. PT advised to monitor but he most likely feels the pulling as loose skin at end is pushed back with more narrow socket.    Education Provided Skin check    Person(s) Educated Patient    Education Method Explanation    Education Method Verbalized understanding                  PT Education - 05/31/21 1939    Education Details see prosthetis education section    Person(s) Educated Patient    Methods Explanation    Comprehension Verbalized understanding            PT Short Term Goals - 05/31/21 1939      PT SHORT TERM GOAL #1   Title Pt will be independent with prosthetic care and management to manage prosthesis (including sock ply adjustment) on his own. (Target Date:  Following 3rd visit)    Baseline 05/31/21 Changes  have be made recently to prosthesis with new socket so PT continues to educate. Pt is independent with donning/doffing. Still needs some advice on sock adjustment and ways to help with edema.    Time 1    Period Months    Status Partially Met      PT SHORT TERM GOAL #2   Title Pt will negotiate up/down stairs with B prostheses and cane at S level in order to indicate independence with home entry/exit.  Baseline 05/31/21 Needs min A with use of cane. Supervision with left rail. Pt has also had recent changes made to prosthesis so still getting used to it    Time 1    Period Months    Status On-going      PT SHORT TERM GOAL #3   Title Pt will initiate HEP for high level balance and strengthening and report compliance.    Baseline initiated 05/09/21    Time 1    Period Months    Status Achieved      PT SHORT TERM GOAL #4   Title Will assess DGI and set LTG to reflect progress.    Baseline 13/24    Time 1    Period Months    Status Achieved             PT Long Term Goals - 05/31/21 1945      PT LONG TERM GOAL #1   Title Pt will tolerate wearing prostheses all awake hours with no skin issues for improved function. (Target Date:  by 11th visit)    Baseline 7-8 hours per day with cues for correct ply sock adjustment    Time 2    Period Months    Status New      PT LONG TERM GOAL #2   Title Pt will improve gait speed to >/=3.00 ft/sec with B prostheses and without device in order to indicate safe community ambulation.    Baseline 2.59 ft/sec with tripod cane and B prostheses. 05/31/21 2.78 ft/sec    Time 2    Period Months    Status New      PT LONG TERM GOAL #3   Title Pt will improve TUG to </=13.5 secs without AD in order to indicate dec fall risk.    Baseline 16.79 secs with tripod cane    Time 2    Period Months    Status New      PT LONG TERM GOAL #4   Title Pt will negotiate up/down 4 steps with LRAD at mod I level in order to enter/exit home safely.    Baseline  min A    Time 2    Period Months    Status New      PT LONG TERM GOAL #5   Title Pt will negotiate up/down curb and ramp, ambulate 1000' w/ LRAD in order to improve community access.    Baseline does not negotiate ramp at this time, limited community mobilty due to poor endurance.    Time 2    Period Months    Status New      PT LONG TERM GOAL #6   Title Pt will negotiate over uneven terrain (grass, mulch, etc) with LRAD (trekking poles if needed) x 500' in order to indicate safe return to hiking.    Baseline not hiking at this time    Time 2    Period Months    Status New      PT LONG TERM GOAL #7   Title Pt will improve DGI to >/=17/24 in order to indicate dec fall risk.    Baseline 13/24    Time 2    Period Months    Status New                 Plan - 05/31/21 1945    Clinical Impression Statement PT assessed STGs today. Pt making progress towards all goals. He has met initial HEP goal and DGI  has been assessed. He is showing improving ability to self manage his prosthesis but due to prosthetist making recent changes to socket PT is still providing education on how to best accomodate to new socket. Due to changes in prosthesis on left stair training has been limited. Pt is able to negotiate steps with left rail supervision but is min assist with cane use. Pt will continue to benefit from skilled PT to further progress his balance, strength and functional mobility with new prostheses.    Personal Factors and Comorbidities Comorbidity 3+;Social Background    Comorbidities see above    Examination-Activity Limitations Carry;Caring for Others;Locomotion Level;Lift;Dressing;Bathing;Squat;Stairs;Stand    Examination-Participation Restrictions Community Activity;Meal Prep;Driving;Laundry;Cleaning    Stability/Clinical Decision Making Evolving/Moderate complexity    Rehab Potential Excellent    PT Frequency 1x / week   1x/wk for 3 weeks then 2x/wk for 4 weeks   PT Duration 3  weeks    PT Treatment/Interventions ADLs/Self Care Home Management;Aquatic Therapy;DME Instruction;Gait training;Stair training;Functional mobility training;Therapeutic activities;Therapeutic exercise;Balance training;Neuromuscular re-education;Patient/family education;Prosthetic Training;Passive range of motion;Energy conservation;Vestibular    PT Next Visit Plan How is left socket feeling with wearing shrinker when out of it? Is it as tight? continue to work on gait/barriers/balance with bil prostheses with decreased UE support. Balance activities.    PT Home Exercise Plan Access Code: [facility]    Consulted and Agree with Plan of Care Patient;Family member/caregiver    Family Member Consulted mother           Patient will benefit from skilled therapeutic intervention in order to improve the following deficits and impairments:  Abnormal gait,Cardiopulmonary status limiting activity,Decreased activity tolerance,Decreased balance,Decreased endurance,Decreased knowledge of precautions,Decreased mobility,Decreased strength,Difficulty walking,Impaired perceived functional ability,Impaired UE functional use,Postural dysfunction,Prosthetic Dependency  Visit Diagnosis: Other abnormalities of gait and mobility  Muscle weakness (generalized)  Unsteadiness on feet     Problem List Patient Active Problem List   Diagnosis Date Noted  . 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 Dehiscence of amputation stump ([place])   . Pacemaker - MDT 09/09/2020  . Wound of left leg 08/24/2020  . Fatigue 07/31/2020  . 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 Educated about COVID-19 virus infection 05/11/2020  . S/P bilateral below knee amputation ([place]) 05/09/2020  . Amputation of right arm ([place]) 05/09/2020  . History of bacterial endocarditis 05/02/2020  . 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 Complete heart block ([place]) 12/11/2017  . History of tricuspid valve  replacement with bioprosthetic valve 12/11/2017  . Opioid use disorder, moderate, in early remission ([place]) 12/11/2017  . Depression 12/11/2017    Electa Sniff, PT, DPT, NCS 05/31/2021, 7:50 PM  [facility] [address] [place], [place], [address] Phone: [phone]   Fax:  [phone]  Name: Wesley Fuentes MRN: [id number] Date of Birth: April 18, 1976

## 2021-06-05 ENCOUNTER — Ambulatory Visit: Payer: Medicaid Other

## 2021-06-05 ENCOUNTER — Other Ambulatory Visit: Payer: Self-pay

## 2021-06-05 DIAGNOSIS — R2681 Unsteadiness on feet: Secondary | ICD-10-CM

## 2021-06-05 DIAGNOSIS — R2689 Other abnormalities of gait and mobility: Secondary | ICD-10-CM

## 2021-06-05 DIAGNOSIS — M6281 Muscle weakness (generalized): Secondary | ICD-10-CM

## 2021-06-05 NOTE — Therapy (Signed)
[facility] [address] [place], [place], [address] Phone: [phone]   Fax:  [phone]  Physical Therapy Treatment  Patient Details  Name: Wesley Fuentes MRN: [id number] Date of Birth: 1976-12-06 Referring Provider (PT): Meridee Score, MD [facility], [place])   Encounter Date: 06/05/2021   PT End of Session - 06/05/21 1317    Visit Number 5    Number of Visits 11    Authorization Type MCD-approval dates 5/6-5/26 (3 visits), 6/9-7-6 8 visits    Authorization - Visit Number 1    Authorization - Number of Visits 8    PT Start Time 5597    PT Stop Time 1401    PT Time Calculation (min) 46 min    Equipment Utilized During Treatment Gait belt    Activity Tolerance Patient tolerated treatment well    Behavior During Therapy W[facility] for tasks assessed/performed           Past Medical History:  Diagnosis Date  . CHB (complete heart block) ([place])   . Depression 12/11/2017  . Endocarditis of tricuspid valve   . Hepatitis C   . History of complete heart block 12/11/2017  . 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 MRSA (methicillin resistant Staphylococcus aureus)   . Opioid use disorder, moderate, in early remission ([place]) 12/11/2017  . Presence of permanent cardiac pacemaker    [employer]    Past Surgical History:  Procedure Laterality Date  . BELOW KNEE LEG AMPUTATION Bilateral   . PACEMAKER REVISION     moved from chest to abd.   . Permanent pacemaker placement     On 11/24/17-had [facility] Jude dual-chamber permanent pacemaker placed.  . right hand amputation    . STUMP REVISION Right 10/25/2020   Procedure: REVISION RIGHT BELOW KNEE AMPUTATION;  Surgeon: Newt Minion, MD;  Location: [facility];  Service: Orthopedics;  Laterality: Right;  . Tricuspid valve replacement     On 11/17/17-replacement of tricuspid valve with a 29 mm Medtronic Mosaic mitral prosthesis.    There were  no vitals filed for this visit.   Subjective Assessment - 06/05/21 1318    Subjective Pt reports that he is doing better since wearing shrinker to when prosthesis is not on. Does not feel as tight. He is not wearing any socks currently. Added 5 ply to right prosthesis upon arrival.    Patient is accompained by: Family member    How long can you stand comfortably? 15-20 mins    How long can you walk comfortably? 20 mins    Patient Stated Goals "I want to be able to hike, bike, drive."    Currently in Pain? No/denies                             [facility] Adult PT Treatment/Exercise - 06/05/21 1321      Ambulation/Gait   Ambulation/Gait Yes    Ambulation/Gait Assistance 5: Supervision;4: Min guard    Ambulation/Gait Assistance Details Pt was cued to keep weight forward more with going up incline and back more when going down. Pt instructed to watch foot placement over grass. CGA on nonlevel ground. HR=99, O2 sat=91    Ambulation Distance (Feet) 850 Feet    Assistive device Straight cane;Prostheses    Gait Pattern Step-through pattern    Ambulation Surface Level;Unlevel;Outdoor;Paved;Grass      Neuro Re-ed    Neuro Re-ed Details  In // bars: step-ups on 6" step with RLE x 10 with 1 UE support. Attempted with LLE but pt was leaning too much over bar to left. Switched to 4" step with LLE x 10 with verbal and tactile cues to tighten gluts more to keep pelvic level. Pelvic drops/raisies off 4" step x 10 each side. Side stepping with green theraband around thighs 8' x 6 with verbal cues for upright posture. Mini-squats with green theraband around thighs to try to get more hip abductor activation with visual cues in mirror as well. Pt instructed not to let knees come today. Sit to stand x 5 from edge of mat with band around thighs with no UE support  CGA/min assist.      Exercises   Exercises Other Exercises    Other Exercises  Sidelying hip abd with green theraband x 10 with verbal  and tactile cues for form. Was more challenging on left leg. Instructed at home may want to not use band on left or use lighter resistance. HR=76 and O2=94% after exercises and prior to gait outside.      Prosthetics   Prosthetic Care Comments  Discussed continuing to use shrinker as left prosthesis was feeling better today. Pt may try his tighter shrinker instead to see if helps more.                  PT Education - 06/05/21 2008    Education Details Added more exercises for hip abductor strengthening.    Person(s) Educated Patient    Methods Explanation;Demonstration;Handout    Comprehension Verbalized understanding;Returned demonstration            PT Short Term Goals - 05/31/21 1939      PT SHORT TERM GOAL #1   Title Pt will be independent with prosthetic care and management to manage prosthesis (including sock ply adjustment) on his own. (Target Date:  Following 3rd visit)    Baseline 05/31/21 Changes have be made recently to prosthesis with new socket so PT continues to educate. Pt is independent with donning/doffing. Still needs some advice on sock adjustment and ways to help with edema.    Time 1    Period Months    Status Partially Met      PT SHORT TERM GOAL #2   Title Pt will negotiate up/down stairs with B prostheses and cane at S level in order to indicate independence with home entry/exit.    Baseline 05/31/21 Needs min A with use of cane. Supervision with left rail. Pt has also had recent changes made to prosthesis so still getting used to it    Time 1    Period Months    Status On-going      PT SHORT TERM GOAL #3   Title Pt will initiate HEP for high level balance and strengthening and report compliance.    Baseline initiated 05/09/21    Time 1    Period Months    Status Achieved      PT SHORT TERM GOAL #4   Title Will assess DGI and set LTG to reflect progress.    Baseline 13/24    Time 1    Period Months    Status Achieved             PT Long  Term Goals - 05/31/21 1945      PT LONG TERM GOAL #1   Title Pt will tolerate wearing prostheses all awake hours with no skin issues for  improved function. (Target Date:  by 11th visit)    Baseline 7-8 hours per day with cues for correct ply sock adjustment    Time 2    Period Months    Status New      PT LONG TERM GOAL #2   Title Pt will improve gait speed to >/=3.00 ft/sec with B prostheses and without device in order to indicate safe community ambulation.    Baseline 2.59 ft/sec with tripod cane and B prostheses. 05/31/21 2.78 ft/sec    Time 2    Period Months    Status New      PT LONG TERM GOAL #3   Title Pt will improve TUG to </=13.5 secs without AD in order to indicate dec fall risk.    Baseline 16.79 secs with tripod cane    Time 2    Period Months    Status New      PT LONG TERM GOAL #4   Title Pt will negotiate up/down 4 steps with LRAD at mod I level in order to enter/exit home safely.    Baseline min A    Time 2    Period Months    Status New      PT LONG TERM GOAL #5   Title Pt will negotiate up/down curb and ramp, ambulate 1000' w/ LRAD in order to improve community access.    Baseline does not negotiate ramp at this time, limited community mobilty due to poor endurance.    Time 2    Period Months    Status New      PT LONG TERM GOAL #6   Title Pt will negotiate over uneven terrain (grass, mulch, etc) with LRAD (trekking poles if needed) x 500' in order to indicate safe return to hiking.    Baseline not hiking at this time    Time 2    Period Months    Status New      PT LONG TERM GOAL #7   Title Pt will improve DGI to >/=17/24 in order to indicate dec fall risk.    Baseline 13/24    Time 2    Period Months    Status New                 Plan - 06/05/21 2009    Clinical Impression Statement Pt was having less discomfort in left residual limb today. PT continued to focus more on functional strengthening and balance with gait. Pt continues to  demonstrate more left hip abductor weakness with step-up activities.    Personal Factors and Comorbidities Comorbidity 3+;Social Background    Comorbidities see above    Examination-Activity Limitations Carry;Caring for Others;Locomotion Level;Lift;Dressing;Bathing;Squat;Stairs;Stand    Examination-Participation Restrictions Community Activity;Meal Prep;Driving;Laundry;Cleaning    Stability/Clinical Decision Making Evolving/Moderate complexity    Rehab Potential Excellent    PT Frequency 1x / week   1x/wk for 3 weeks then 2x/wk for 4 weeks   PT Duration 3 weeks    PT Treatment/Interventions ADLs/Self Care Home Management;Aquatic Therapy;DME Instruction;Gait training;Stair training;Functional mobility training;Therapeutic activities;Therapeutic exercise;Balance training;Neuromuscular re-education;Patient/family education;Prosthetic Training;Passive range of motion;Energy conservation;Vestibular    PT Next Visit Plan Continue functional strengthening with getting more left hip abductor activation.  continue to work on gait/barriers/balance with bil prostheses with decreased UE support. Balance activities.    PT Home Exercise Plan Access Code: [facility]    Consulted and Agree with Plan of Care Patient;Family member/caregiver    Family Member Consulted mother  Patient will benefit from skilled therapeutic intervention in order to improve the following deficits and impairments:  Abnormal gait,Cardiopulmonary status limiting activity,Decreased activity tolerance,Decreased balance,Decreased endurance,Decreased knowledge of precautions,Decreased mobility,Decreased strength,Difficulty walking,Impaired perceived functional ability,Impaired UE functional use,Postural dysfunction,Prosthetic Dependency  Visit Diagnosis: Other abnormalities of gait and mobility  Muscle weakness (generalized)  Unsteadiness on feet     Problem List Patient Active Problem List   Diagnosis Date Noted  .  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 Dehiscence of amputation stump ([place])   . Pacemaker - MDT 09/09/2020  . Wound of left leg 08/24/2020  . Fatigue 07/31/2020  . 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 Educated about COVID-19 virus infection 05/11/2020  . S/P bilateral below knee amputation ([place]) 05/09/2020  . Amputation of right arm ([place]) 05/09/2020  . History of bacterial endocarditis 05/02/2020  . 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 Complete heart block ([place]) 12/11/2017  . 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 Opioid use disorder, moderate, in early remission ([place]) 12/11/2017  . Depression 12/11/2017    Electa Sniff, PT, DPT, NCS 06/05/2021, 8:13 PM  [facility] [address] [place], [place], [address] Phone: [phone]   Fax:  [phone]  Name: Wesley Fuentes MRN: [id number] Date of Birth: [birth year]

## 2021-06-05 NOTE — Patient Instructions (Signed)
Access Code: [email or web address] URL: [email or web address] Date: 06/05/2021 Prepared by: [name]  Exercises Lateral Weight Shift with Parallel Bars (AKA) - 2 x daily - 7 x weekly - 1 sets - 10 reps Standing Hip Abduction with Counter Support - 2 x daily - 7 x weekly - 1 sets - 10 reps Standing Marching - 2 x daily - 7 x weekly - 1 sets - 10 reps Side Stepping with Counter Support - 1 x daily - 5 x weekly - 1 sets - 3 reps Backward Walking with Counter Support - 1 x daily - 5 x weekly - 1 sets - 3 reps Sidelying Hip Abduction with Resistance at Thighs - 1 x daily - 7 x weekly - 2 sets - 10 reps Mini-squat with band - 1 x daily - 7 x weekly - 2 sets - 10 reps

## 2021-06-06 ENCOUNTER — Ambulatory Visit (INDEPENDENT_AMBULATORY_CARE_PROVIDER_SITE_OTHER): Payer: Medicaid Other

## 2021-06-06 DIAGNOSIS — I442 Atrioventricular block, complete: Secondary | ICD-10-CM | POA: Diagnosis not present

## 2021-06-06 LAB — CUP PACEART REMOTE DEVICE CHECK
Battery Remaining Longevity: 150 mo
Battery Voltage: 3.05 V
Brady Statistic RV Percent Paced: 99.94 %
Date Time Interrogation Session: 20220608015756
Implantable Lead Implant Date: 20210308
Implantable Lead Location: 753862
Implantable Lead Model: 4968
Implantable Pulse Generator Implant Date: 20210308
Lead Channel Impedance Value: 570 Ohm
Lead Channel Impedance Value: 798 Ohm
Lead Channel Pacing Threshold Amplitude: 1.125 V
Lead Channel Pacing Threshold Pulse Width: 0.4 ms
Lead Channel Sensing Intrinsic Amplitude: 9.125 mV
Lead Channel Sensing Intrinsic Amplitude: 9.125 mV
Lead Channel Setting Pacing Amplitude: 2.25 V
Lead Channel Setting Pacing Pulse Width: 0.4 ms
Lead Channel Setting Sensing Sensitivity: 1.2 mV

## 2021-06-07 ENCOUNTER — Ambulatory Visit: Payer: Medicaid Other | Admitting: Physical Therapy

## 2021-06-08 ENCOUNTER — Other Ambulatory Visit: Payer: Self-pay | Admitting: Family Medicine

## 2021-06-08 ENCOUNTER — Telehealth: Payer: Self-pay

## 2021-06-08 NOTE — Telephone Encounter (Signed)
Patient calls nurse line requesting refill on Remeron rx. Informed patient that provider sent 90 day supply with two refills to his pharmacy on 05/01/21. Patient reports that he was told that he could increase dosage after a few weeks on medication and that he is now out of medicine.   Patient states that he may have to find another office for care as he is always running into issues with his medications. Advised patient that I was trying to help figure out what the issue was with medication, patient became upset and said "I know this isn't your problem, bye"   Please advise.   Veronda Prude, RN

## 2021-06-12 ENCOUNTER — Ambulatory Visit: Payer: Medicaid Other | Admitting: Physical Therapy

## 2021-06-14 ENCOUNTER — Ambulatory Visit: Payer: Medicaid Other | Admitting: Physical Therapy

## 2021-06-14 ENCOUNTER — Other Ambulatory Visit: Payer: Self-pay

## 2021-06-14 DIAGNOSIS — R2681 Unsteadiness on feet: Secondary | ICD-10-CM

## 2021-06-14 DIAGNOSIS — M6281 Muscle weakness (generalized): Secondary | ICD-10-CM

## 2021-06-14 DIAGNOSIS — R2689 Other abnormalities of gait and mobility: Secondary | ICD-10-CM

## 2021-06-14 DIAGNOSIS — R293 Abnormal posture: Secondary | ICD-10-CM

## 2021-06-15 NOTE — Therapy (Signed)
[facility] [address] [place], [place], [address] Phone: [phone]   Fax:  [phone]  Physical Therapy Treatment  Patient Details  Name: Wesley Fuentes MRN: [id number] Date of Birth: 1976/10/13 Referring Provider (PT): Meridee Score, MD [facility], [place])   Encounter Date: 06/14/2021   PT End of Session - 06/14/21 1326     Visit Number 6    Number of Visits 11    Authorization Type MCD-approval dates 5/6-5/26 (3 visits), 6/9-7-6 8 visits    Authorization - Visit Number 2    Authorization - Number of Visits 8    PT Start Time 1319    PT Stop Time 1400    PT Time Calculation (min) 41 min    Equipment Utilized During Treatment Gait belt    Activity Tolerance Patient tolerated treatment well    Behavior During Therapy WFL for tasks assessed/performed             Past Medical History:  Diagnosis Date   CHB (complete heart block) ([place])    Depression 12/11/2017   Endocarditis of tricuspid valve    Hepatitis C    History of complete heart block 12/11/2017   History of tricuspid valve replacement with bioprosthetic valve 12/11/2017   MRSA (methicillin resistant Staphylococcus aureus)    Opioid use disorder, moderate, in early remission ([place]) 12/11/2017   Presence of permanent cardiac pacemaker    MedTronic    Past Surgical History:  Procedure Laterality Date   BELOW KNEE LEG AMPUTATION Bilateral    PACEMAKER REVISION     moved from chest to abd.    Permanent pacemaker placement     On 11/24/17-had [facility] Jude dual-chamber permanent pacemaker placed.   right hand amputation     STUMP REVISION Right 10/25/2020   Procedure: REVISION RIGHT BELOW KNEE AMPUTATION;  Surgeon: Newt Minion, MD;  Location: [facility];  Service: Orthopedics;  Laterality: Right;   Tricuspid valve replacement     On 11/17/17-replacement of tricuspid valve with a 29 mm Medtronic Mosaic mitral prosthesis.    There were no  vitals filed for this visit.   Subjective Assessment - 06/14/21 1323     Subjective Saw Richardson Landry last week because the left prosthesis is causing his limb to go numb. He cut out the plastic inner liner and ground out an area on the lateral area of the plastic liner and socket. Pt reports he can now wear it for an hour max before getting numb, was only 30 minutes before the adjustments were made. See's him again next week. Has been working on the strengthening ex's for the legs at home.    Patient is accompained by: Family member   mom   How long can you stand comfortably? 15-20 mins    How long can you walk comfortably? 20 mins    Patient Stated Goals "I want to be able to hike, bike, drive."    Currently in Pain? No/denies    Pain Score 0-No pain                   [facility] Adult PT Treatment/Exercise - 06/14/21 1327       Transfers   Transfers Sit to Stand;Stand to Sit    Sit to Stand 6: Modified independent (Device/Increase time)    Stand to Sit 6: Modified independent (Device/Increase time)      Ambulation/Gait   Ambulation/Gait Yes    Ambulation/Gait Assistance 5: Supervision;4: Min  guard    Ambulation/Gait Assistance Details around clinic with session    Assistive device Straight cane;Prostheses    Gait Pattern Step-through pattern    Ambulation Surface Level;Indoor    Stairs Yes    Stairs Assistance 5: Supervision;4: Min guard;4: Min assist    Stairs Assistance Details (indicate cue type and reason) 1st rep: 1 rail with right leg leading up and left leading down, 2cd rep: with rail with left leg leading up and right leading down, 3rd rep had pt alternating the lead leg for each step with ascending and descending with the education that by alternating the lead leg, no one leg gets overtired. Pt using step to patterns for all of the reps. 4th and 5th reps working recipocal pattern to ascend and descend.    Stair Management Technique One rail Right;One rail Left;Forwards;Step to  pattern;Alternating pattern    Number of Stairs 4   x 5 reps   Height of Stairs 6    Curb 4: Min assist;Other (comment)   min guard   Curb Details (indicate cue type and reason) both aerobic curbs next to counter top: working on ascending/descending curbs with cane/prostheses with min guard to min assist. cues on technique, sequencing and foot placement with descending. Performed for ~8 laps across both curbs.      Neuro Re-ed    Neuro Re-ed Details  for balance/NMR: gait around track with cane/prostheses working on speed changes, scanning all directions, backward/forward direction changes with min guard to min assist      Exercises   Exercises Other Exercises    Other Exercises  reviewed side lhing hip abd with cues needed on form and upper trunk/arm position due to pt reporting it pulling in his back with head propped in hand/elbow on mat. had pt lie down flatter with improvement in pain. aiso reviewed his sit to stands without UE support with emphasis on controlled movements. pt keeping feet parallel which is difficult for a bil prosthetic pt to balance on. has pt slightly stagger feet and step forward foot backwards as he stands for improved control with pt reporting feeling more balanced, especially with left foot back (stronger leg).      Prosthetics   Current prosthetic wear tolerance (days/week)  daily    Current prosthetic wear tolerance (#hours/day)  most awake hours with brief breaks on left side due to numbness, keeps liner on during these times.    Residual limb condition  no issues, intact per pt report except for the reddness caused on the left limb from pressure from socket that resolves with the rest breaks.                      PT Short Term Goals - 05/31/21 1939       PT SHORT TERM GOAL #1   Title Pt will be independent with prosthetic care and management to manage prosthesis (including sock ply adjustment) on his own. (Target Date:  Following 3rd visit)     Baseline 05/31/21 Changes have be made recently to prosthesis with new socket so PT continues to educate. Pt is independent with donning/doffing. Still needs some advice on sock adjustment and ways to help with edema.    Time 1    Period Months    Status Partially Met      PT SHORT TERM GOAL #2   Title Pt will negotiate up/down stairs with B prostheses and cane at S level in order to indicate  independence with home entry/exit.    Baseline 05/31/21 Needs min A with use of cane. Supervision with left rail. Pt has also had recent changes made to prosthesis so still getting used to it    Time 1    Period Months    Status On-going      PT SHORT TERM GOAL #3   Title Pt will initiate HEP for high level balance and strengthening and report compliance.    Baseline initiated 05/09/21    Time 1    Period Months    Status Achieved      PT SHORT TERM GOAL #4   Title Will assess DGI and set LTG to reflect progress.    Baseline 13/24    Time 1    Period Months    Status Achieved               PT Long Term Goals - 05/31/21 1945       PT LONG TERM GOAL #1   Title Pt will tolerate wearing prostheses all awake hours with no skin issues for improved function. (Target Date:  by 11th visit)    Baseline 7-8 hours per day with cues for correct ply sock adjustment    Time 2    Period Months    Status New      PT LONG TERM GOAL #2   Title Pt will improve gait speed to >/=3.00 ft/sec with B prostheses and without device in order to indicate safe community ambulation.    Baseline 2.59 ft/sec with tripod cane and B prostheses. 05/31/21 2.78 ft/sec    Time 2    Period Months    Status New      PT LONG TERM GOAL #3   Title Pt will improve TUG to </=13.5 secs without AD in order to indicate dec fall risk.    Baseline 16.79 secs with tripod cane    Time 2    Period Months    Status New      PT LONG TERM GOAL #4   Title Pt will negotiate up/down 4 steps with LRAD at mod I level in order to  enter/exit home safely.    Baseline min A    Time 2    Period Months    Status New      PT LONG TERM GOAL #5   Title Pt will negotiate up/down curb and ramp, ambulate 1000' w/ LRAD in order to improve community access.    Baseline does not negotiate ramp at this time, limited community mobilty due to poor endurance.    Time 2    Period Months    Status New      PT LONG TERM GOAL #6   Title Pt will negotiate over uneven terrain (grass, mulch, etc) with LRAD (trekking poles if needed) x 500' in order to indicate safe return to hiking.    Baseline not hiking at this time    Time 2    Period Months    Status New      PT LONG TERM GOAL #7   Title Pt will improve DGI to >/=17/24 in order to indicate dec fall risk.    Baseline 13/24    Time 2    Period Months    Status New                   Plan - 06/14/21 1326     Clinical Impression Statement Today's skilled session initially focused  on review of ex's that pt had questions on with cues/corrections provided as needed. Remainder of session was able to focus on gait/balance/barriers with cane/prostheses due to decreased pain today. No issues noted or reported in session today. The pt is making steady progress toward goals and should benefit from continued PT to progress toward unmet goals.    Personal Factors and Comorbidities Comorbidity 3+;Social Background    Comorbidities see above    Examination-Activity Limitations Carry;Caring for Others;Locomotion Level;Lift;Dressing;Bathing;Squat;Stairs;Stand    Examination-Participation Restrictions Community Activity;Meal Prep;Driving;Laundry;Cleaning    Stability/Clinical Decision Making Evolving/Moderate complexity    Rehab Potential Excellent    PT Frequency 1x / week   1x/wk for 3 weeks then 2x/wk for 4 weeks   PT Duration 3 weeks    PT Treatment/Interventions ADLs/Self Care Home Management;Aquatic Therapy;DME Instruction;Gait training;Stair training;Functional mobility  training;Therapeutic activities;Therapeutic exercise;Balance training;Neuromuscular re-education;Patient/family education;Prosthetic Training;Passive range of motion;Energy conservation;Vestibular    PT Next Visit Plan Continue functional strengthening with getting more left hip abductor activation.  continue to work on gait/barriers/balance with bil prostheses with decreased UE support. Balance activities.    PT Home Exercise Plan Access Code: [facility]    Consulted and Agree with Plan of Care Patient;Family member/caregiver    Family Member Consulted mother             Patient will benefit from skilled therapeutic intervention in order to improve the following deficits and impairments:  Abnormal gait, Cardiopulmonary status limiting activity, Decreased activity tolerance, Decreased balance, Decreased endurance, Decreased knowledge of precautions, Decreased mobility, Decreased strength, Difficulty walking, Impaired perceived functional ability, Impaired UE functional use, Postural dysfunction, Prosthetic Dependency  Visit Diagnosis: Other abnormalities of gait and mobility  Muscle weakness (generalized)  Unsteadiness on feet  Abnormal posture     Problem List Patient Active Problem List   Diagnosis Date Noted   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 ([place])    Dehiscence of amputation stump ([place])    Pacemaker - MDT 09/09/2020   Wound of left leg 08/24/2020   Fatigue 07/31/2020   S/P TVR (tricuspid valve replacement) 05/11/2020   Educated about COVID-19 virus infection 05/11/2020   S/P bilateral below knee amputation ([place]) 05/09/2020   Amputation of right arm ([place]) 05/09/2020   History of bacterial endocarditis 05/02/2020   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 [place], [place] [address] [phone] 06/15/21, 4:42 PM    Name: Wesley Fuentes MRN: [id number] Date of Birth: 12/13/1976

## 2021-06-19 ENCOUNTER — Other Ambulatory Visit: Payer: Self-pay

## 2021-06-19 ENCOUNTER — Ambulatory Visit: Payer: Medicaid Other | Admitting: Physical Therapy

## 2021-06-19 DIAGNOSIS — M6281 Muscle weakness (generalized): Secondary | ICD-10-CM

## 2021-06-19 DIAGNOSIS — R2681 Unsteadiness on feet: Secondary | ICD-10-CM

## 2021-06-19 DIAGNOSIS — R2689 Other abnormalities of gait and mobility: Secondary | ICD-10-CM

## 2021-06-19 DIAGNOSIS — R293 Abnormal posture: Secondary | ICD-10-CM

## 2021-06-19 NOTE — Patient Instructions (Signed)
Access Code: [email or web address] URL: [email or web address] Date: 06/19/2021 Prepared by: Sallyanne Kuster  Exercises Standing Hip Abduction with Counter Support - 2 x daily - 5 x weekly - 1 sets - 10 reps Standing Marching - 2 x daily - 5 x weekly - 1 sets - 10 reps Side Stepping with Counter Support - 1 x daily - 5 x weekly - 1 sets - 3 reps Backward Walking with Counter Support - 1 x daily - 5 x weekly - 1 sets - 3 reps Sidelying Hip Abduction with Resistance at Thighs - 1 x daily - 5 x weekly - 10 reps Mini-squat with band - 1 x daily - 5 x weekly - 3 sets - 5 reps

## 2021-06-19 NOTE — Therapy (Signed)
[facility] [address] [place], [place], [address] Phone: [phone]   Fax:  [phone]  Physical Therapy Treatment  Patient Details  Name: Wesley Fuentes MRN: [id number] Date of Birth: 1976/03/12 Referring Provider (PT): Meridee Score, MD [facility], [place])   Encounter Date: 06/19/2021   PT End of Session - 06/19/21 1325     Visit Number 7    Number of Visits 11    Authorization Type MCD-approval dates 5/6-5/26 (3 visits), 6/9-7-6 8 visits    Authorization - Visit Number 3    Authorization - Number of Visits 8    PT Start Time 2482    PT Stop Time 1400    PT Time Calculation (min) 42 min    Equipment Utilized During Treatment Gait belt    Activity Tolerance Patient tolerated treatment well    Behavior During Therapy WFL for tasks assessed/performed             Past Medical History:  Diagnosis Date   CHB (complete heart block) ([place])    Depression 12/11/2017   Endocarditis of tricuspid valve    Hepatitis C    History of complete heart block 12/11/2017   History of tricuspid valve replacement with bioprosthetic valve 12/11/2017   MRSA (methicillin resistant Staphylococcus aureus)    Opioid use disorder, moderate, in early remission ([place]) 12/11/2017   Presence of permanent cardiac pacemaker    [employer]    Past Surgical History:  Procedure Laterality Date   BELOW KNEE LEG AMPUTATION Bilateral    PACEMAKER REVISION     moved from chest to abd.    Permanent pacemaker placement     On 11/24/17-had [facility] Jude dual-chamber permanent pacemaker placed.   right hand amputation     STUMP REVISION Right 10/25/2020   Procedure: REVISION RIGHT BELOW KNEE AMPUTATION;  Surgeon: Newt Minion, MD;  Location: [facility];  Service: Orthopedics;  Laterality: Right;   Tricuspid valve replacement     On 11/17/17-replacement of tricuspid valve with a 29 mm Medtronic Mosaic mitral prosthesis.    There were no  vitals filed for this visit.   Subjective Assessment - 06/19/21 1320     Subjective No new complaints. No falls or pain. Does still get the numbness on left side at times- wearing times vary before this happens depending on how swollen his limb is. The more swollen the less time he can wear them before numbess occurs. See's Richardson Landry again sometime this week for a follow up (the 23rd per Mom).    Patient is accompained by: Family member   mom   How long can you stand comfortably? 15-20 mins    How long can you walk comfortably? 20 mins    Patient Stated Goals "I want to be able to hike, bike, drive."    Currently in Pain? No/denies    Pain Score 0-No pain                    [facility] Adult PT Treatment/Exercise - 06/19/21 1326       Transfers   Transfers Sit to Stand;Stand to Sit    Sit to Stand 6: Modified independent (Device/Increase time)    Stand to Sit 6: Modified independent (Device/Increase time)      Ambulation/Gait   Ambulation/Gait Yes    Ambulation/Gait Assistance 5: Supervision    Ambulation/Gait Assistance Details for gait in/oudoors combined had pt scanning all directions randomly with cues to narrow  base of support for more normalized base of support with supervision. indoors around clinic- gait with no AD with supervision between activities until on curbs, then returned to using cane.    Ambulation Distance (Feet) 500 Feet   x1   Assistive device Straight cane;Prostheses;None    Gait Pattern Step-through pattern;Wide base of support    Ambulation Surface Level;Unlevel;Indoor;Outdoor;Paved    Curb 4: Min assist;Other (comment)   min guard assist   Curb Details (indicate cue type and reason) continue with use of both aerobic curbs next to counter top with increased space between them. working on ascending/descending with cane, with cues on foot placement. Began on to work on walking approach toward curb for ascending and continued gait after descend to work on [employer]  with negotiation with improved technique noted. min guard assist for safety.      Exercises   Exercises Other Exercises    Other Exercises  updated HEP. Refer to [place] for full details. cues for correct from and technique.            Issued to HEP today:  Access Code: [facility] URL: [email or web address] Date: 06/19/2021 Prepared by: Willow Ora  Exercises Standing Hip Abduction with Counter Support - 2 x daily - 5 x weekly - 1 sets - 10 reps Standing Marching - 2 x daily - 5 x weekly - 1 sets - 10 reps Side Stepping with Counter Support - 1 x daily - 5 x weekly - 1 sets - 3 reps Backward Walking with Counter Support - 1 x daily - 5 x weekly - 1 sets - 3 reps Sidelying Hip Abduction with Resistance at Thighs - 1 x daily - 5 x weekly - 10 reps Mini-squat with band - 1 x daily - 5 x weekly - 3 sets - 5 reps        PT Education - 06/19/21 1445     Education Details updated HEP. Refer to [place] for full details.    Person(s) Educated Patient;Parent(s)    Methods Explanation;Demonstration;Verbal cues;Handout    Comprehension Verbalized understanding;Returned demonstration;Verbal cues required;Need further instruction              PT Short Term Goals - 05/31/21 1939       PT SHORT TERM GOAL #1   Title Pt will be independent with prosthetic care and management to manage prosthesis (including sock ply adjustment) on his own. (Target Date:  Following 3rd visit)    Baseline 05/31/21 Changes have be made recently to prosthesis with new socket so PT continues to educate. Pt is independent with donning/doffing. Still needs some advice on sock adjustment and ways to help with edema.    Time 1    Period Months    Status Partially Met      PT SHORT TERM GOAL #2   Title Pt will negotiate up/down stairs with B prostheses and cane at S level in order to indicate independence with home entry/exit.    Baseline 05/31/21 Needs min A with use of cane. Supervision with  left rail. Pt has also had recent changes made to prosthesis so still getting used to it    Time 1    Period Months    Status On-going      PT SHORT TERM GOAL #3   Title Pt will initiate HEP for high level balance and strengthening and report compliance.    Baseline initiated 05/09/21    Time 1    Period Months  Status Achieved      PT SHORT TERM GOAL #4   Title Will assess DGI and set LTG to reflect progress.    Baseline 13/24    Time 1    Period Months    Status Achieved               PT Long Term Goals - 05/31/21 1945       PT LONG TERM GOAL #1   Title Pt will tolerate wearing prostheses all awake hours with no skin issues for improved function. (Target Date:  by 11th visit)    Baseline 7-8 hours per day with cues for correct ply sock adjustment    Time 2    Period Months    Status New      PT LONG TERM GOAL #2   Title Pt will improve gait speed to >/=3.00 ft/sec with B prostheses and without device in order to indicate safe community ambulation.    Baseline 2.59 ft/sec with tripod cane and B prostheses. 05/31/21 2.78 ft/sec    Time 2    Period Months    Status New      PT LONG TERM GOAL #3   Title Pt will improve TUG to </=13.5 secs without AD in order to indicate dec fall risk.    Baseline 16.79 secs with tripod cane    Time 2    Period Months    Status New      PT LONG TERM GOAL #4   Title Pt will negotiate up/down 4 steps with LRAD at mod I level in order to enter/exit home safely.    Baseline min A    Time 2    Period Months    Status New      PT LONG TERM GOAL #5   Title Pt will negotiate up/down curb and ramp, ambulate 1000' w/ LRAD in order to improve community access.    Baseline does not negotiate ramp at this time, limited community mobilty due to poor endurance.    Time 2    Period Months    Status New      PT LONG TERM GOAL #6   Title Pt will negotiate over uneven terrain (grass, mulch, etc) with LRAD (trekking poles if needed) x 500' in  order to indicate safe return to hiking.    Baseline not hiking at this time    Time 2    Period Months    Status New      PT LONG TERM GOAL #7   Title Pt will improve DGI to >/=17/24 in order to indicate dec fall risk.    Baseline 13/24    Time 2    Period Months    Status New                   Plan - 06/19/21 1325     Clinical Impression Statement Today's skilled session focused on revison of pt's HEP with no issues noted with changes made during performance in session today. Remainder of session focused on gait with cane/prostheses on various surfaces and continued to address curb negotiation as well. Pt with improved technique toward end of session on curbs with practice/cues in session. The pt is making steady progress toward goals and should benefit from continued PT to progress toward unmet goals.    Personal Factors and Comorbidities Comorbidity 3+;Social Background    Comorbidities see above    Examination-Activity Limitations Carry;Caring for Others;Locomotion Level;Lift;Dressing;Bathing;Squat;Stairs;Stand  Examination-Participation Restrictions Community Activity;Meal Prep;Driving;Laundry;Cleaning    Stability/Clinical Decision Making Evolving/Moderate complexity    Rehab Potential Excellent    PT Frequency 1x / week   1x/wk for 3 weeks then 2x/wk for 4 weeks   PT Duration 3 weeks    PT Treatment/Interventions ADLs/Self Care Home Management;Aquatic Therapy;DME Instruction;Gait training;Stair training;Functional mobility training;Therapeutic activities;Therapeutic exercise;Balance training;Neuromuscular re-education;Patient/family education;Prosthetic Training;Passive range of motion;Energy conservation;Vestibular    PT Next Visit Plan Continue functional strengthening with getting more left hip abductor activation.  continue to work on gait/barriers/balance with bil prostheses with decreased UE support. Balance activities.    PT Home Exercise Plan Access Code:  [facility]    Consulted and Agree with Plan of Care Patient;Family member/caregiver    Family Member Consulted mother             Patient will benefit from skilled therapeutic intervention in order to improve the following deficits and impairments:  Abnormal gait, Cardiopulmonary status limiting activity, Decreased activity tolerance, Decreased balance, Decreased endurance, Decreased knowledge of precautions, Decreased mobility, Decreased strength, Difficulty walking, Impaired perceived functional ability, Impaired UE functional use, Postural dysfunction, Prosthetic Dependency  Visit Diagnosis: Other abnormalities of gait and mobility  Muscle weakness (generalized)  Unsteadiness on feet  Abnormal posture     Problem List Patient Active Problem List   Diagnosis Date Noted   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 ([place])    Dehiscence of amputation stump ([place])    Pacemaker - MDT 09/09/2020   Wound of left leg 08/24/2020   Fatigue 07/31/2020   S/P TVR (tricuspid valve replacement) 05/11/2020   Educated about COVID-19 virus infection 05/11/2020   S/P bilateral below knee amputation ([place]) 05/09/2020   Amputation of right arm ([place]) 05/09/2020   History of bacterial endocarditis 05/02/2020   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 [address] [phone] 06/19/21, 3:58 PM    Name: AD GUTTMAN MRN: [id number] Date of Birth: 25-Aug-1976

## 2021-06-21 ENCOUNTER — Encounter: Payer: Self-pay | Admitting: Physical Therapy

## 2021-06-21 ENCOUNTER — Ambulatory Visit: Payer: Medicaid Other | Admitting: Physical Therapy

## 2021-06-21 ENCOUNTER — Other Ambulatory Visit: Payer: Self-pay

## 2021-06-21 DIAGNOSIS — R2689 Other abnormalities of gait and mobility: Secondary | ICD-10-CM

## 2021-06-21 DIAGNOSIS — R2681 Unsteadiness on feet: Secondary | ICD-10-CM

## 2021-06-21 DIAGNOSIS — R293 Abnormal posture: Secondary | ICD-10-CM

## 2021-06-21 DIAGNOSIS — M6281 Muscle weakness (generalized): Secondary | ICD-10-CM

## 2021-06-21 DIAGNOSIS — R262 Difficulty in walking, not elsewhere classified: Secondary | ICD-10-CM

## 2021-06-25 ENCOUNTER — Ambulatory Visit: Payer: Medicaid Other

## 2021-06-25 NOTE — Therapy (Addendum)
[facility] [address] [place], [place], [address] Phone: [phone]   Fax:  [phone]  Physical Therapy Treatment  Patient Details  Name: Wesley Fuentes MRN: [id number] Date of Birth: 02-01-1976 Referring Provider (PT): Meridee Score, MD [facility] [place], [place])   Encounter Date: 06/21/2021    06/21/21 1320  PT Visits / Re-Eval  Visit Number 8  Number of Visits 11  Authorization  Authorization Type MCD-approval dates 5/6-5/26 (3 visits), 6/9-7-6 8 visits  Authorization - Visit Number 4  Authorization - Number of Visits 8  PT Time Calculation  PT Start Time 1318  PT Stop Time 1400  PT Time Calculation (min) 42 min  PT - End of Session  Equipment Utilized During Treatment Gait belt  Activity Tolerance Patient tolerated treatment well  Behavior During Therapy [facility] for tasks assessed/performed    Past Medical History:  Diagnosis Date   CHB (complete heart block) ([place])    Depression 12/11/2017   Endocarditis of tricuspid valve    Hepatitis C    History of complete heart block 12/11/2017   History of tricuspid valve replacement with bioprosthetic valve 12/11/2017   MRSA (methicillin resistant Staphylococcus aureus)    Opioid use disorder, moderate, in early remission ([place]) 12/11/2017   Presence of permanent cardiac pacemaker    [employer]    Past Surgical History:  Procedure Laterality Date   BELOW KNEE LEG AMPUTATION Bilateral    PACEMAKER REVISION     moved from chest to abd.    Permanent pacemaker placement     On 11/24/17-had [facility] Jude dual-chamber permanent pacemaker placed.   right hand amputation     STUMP REVISION Right 10/25/2020   Procedure: REVISION RIGHT BELOW KNEE AMPUTATION;  Surgeon: Newt Minion, MD;  Location: [facility];  Service: Orthopedics;  Laterality: Right;   Tricuspid valve replacement     On 11/17/17-replacement of tricuspid valve with a 29 mm Medtronic Mosaic mitral prosthesis.     There were no vitals filed for this visit.    06/21/21 1319  Symptoms/Limitations  Subjective No new complaitns. No falls or pain to report. Some of the numbness in the left LE on the way here, better after resting in the lobby.  Patient is accompained by: Family member (mom)  How long can you stand comfortably? 15-20 mins  How long can you walk comfortably? 20 mins  Patient Stated Goals "I want to be able to hike, bike, drive."  Pain Assessment  Currently in Pain? No/denies  Pain Score 0       06/21/21 1321  Transfers  Transfers Sit to Stand;Stand to Sit  Sit to Stand 6: Modified independent (Device/Increase time)  Stand to Sit 6: Modified independent (Device/Increase time)  Ambulation/Gait  Ambulation/Gait Yes  Ambulation/Gait Assistance 5: Supervision;4: Min guard  Ambulation/Gait Assistance Details gait with cane/prostheses in/outdoors including up/down grassy hill at back of clinic going toward [place]. Discussed with PTA demo'ing sideways management of steeper hill going down to pond with pt deferring to practice today, did verbalize understanding; gait around track with speed changes fast<>slow, scanning all directions randomly and sudden stops/starts with min guard assist. Pt's bil prostheses noted to be rotating in. added single ply sock with decreased rotation at foot noted. on next rep around tracks pt noted to have medial leaning pylons in stance left>right, pt to have prosthetist look at alignement as well when he goes there today after session.  Ambulation Distance (Feet)  (x1 in/outdoors)  Assistive device Straight cane;Prostheses;None  Gait Pattern Step-through pattern;Wide base of support  Ambulation Surface Level;Unlevel;Indoor;Outdoor;Paved;Grass  Curb 4: Min assist  Curb Details (indicate cue type and reason) x1 outdoors with cane/prosthesis; then continued to bloc practices using both aerobic curb steps next to counter top with cane/prostheses, min  guard to min assist working toward maintaining momentum  High Level Balance  High Level Balance Activities Side stepping;Marching forwards;Backward walking  High Level Balance Comments next to counter with single to no UE support: 3 laps each/each way with min guard assist for safety  Prosthetics  Prosthetic Care Comments  Discussed various types of suspension for prostheses as pt asking about suction suspension vs pin lock suspension.  Current prosthetic wear tolerance (days/week)  daily  Current prosthetic wear tolerance (#hours/day)  most awake hours with brief breaks on left side due to numbness, keeps liner on during these times.            PT Short Term Goals - 05/31/21 1939       PT SHORT TERM GOAL #1   Title Pt will be independent with prosthetic care and management to manage prosthesis (including sock ply adjustment) on his own. (Target Date:  Following 3rd visit)    Baseline 05/31/21 Changes have be made recently to prosthesis with new socket so PT continues to educate. Pt is independent with donning/doffing. Still needs some advice on sock adjustment and ways to help with edema.    Time 1    Period Months    Status Partially Met      PT SHORT TERM GOAL #2   Title Pt will negotiate up/down stairs with B prostheses and cane at S level in order to indicate independence with home entry/exit.    Baseline 05/31/21 Needs min A with use of cane. Supervision with left rail. Pt has also had recent changes made to prosthesis so still getting used to it    Time 1    Period Months    Status On-going      PT SHORT TERM GOAL #3   Title Pt will initiate HEP for high level balance and strengthening and report compliance.    Baseline initiated 05/09/21    Time 1    Period Months    Status Achieved      PT SHORT TERM GOAL #4   Title Will assess DGI and set LTG to reflect progress.    Baseline 13/24    Time 1    Period Months    Status Achieved               PT Long Term  Goals - 06/29/21 0815       PT LONG TERM GOAL #1   Title Pt will tolerate wearing prostheses all awake hours with no skin issues for improved function. (Target Date:  by 11th visit)    Baseline 06/29/21: pt continues to have numbness on left side with increased prosthesis wear. This has improved with changes made to socket by prosthetist and is ongoing. No skin issues and pt adjusting socks without cues a this time    Time 2    Period Months    Status On-going      PT LONG TERM GOAL #2   Title Pt will improve gait speed to >/=3.00 ft/sec with B prostheses and without device in order to indicate safe community ambulation.    Baseline 2.59 ft/sec with tripod cane and B prostheses. 05/31/21 2.78 ft/sec, improved just not to  goal level    Time 2    Period Months    Status On-going      PT LONG TERM GOAL #3   Title Pt will improve TUG to </=13.5 secs without AD in order to indicate dec fall risk.    Baseline 16.79 secs with tripod cane    Time 2    Period Months    Status On-going      PT LONG TERM GOAL #4   Title Pt will negotiate up/down 4 steps with LRAD at mod I level in order to enter/exit home safely.    Baseline 06/29/21: has progressed to min guard to supervision with bil rails, min guard to min assist with rail/cane combo    Time 2    Period Months    Status On-going      PT LONG TERM GOAL #5   Title Pt will negotiate up/down curb and ramp, ambulate 1000' w/ LRAD in order to improve community access.    Baseline 06/29/21: max distance to date ~875 feet on indoor/outdoor surfaces, has began to train on grassy hills, ramps and curbs with bil prostheses/cane with min guard to min assist    Time 2    Period Months    Status On-going      PT LONG TERM GOAL #6   Title Pt will negotiate over uneven terrain (grass, mulch, etc) with LRAD (trekking poles if needed) x 500' in order to indicate safe return to hiking.    Baseline 06/29/21: have began to train on steep grassy hills for working  towards hiking    Time 2    Period Months    Status On-going      PT LONG TERM GOAL #7   Title Pt will improve DGI to >/=17/24 in order to indicate dec fall risk.    Baseline 13/24    Time 2    Period Months    Status On-going             New goals for recertification updated by PT:  PT Short Term Goals - 07/08/21 1217       PT SHORT TERM GOAL #1   Title = LTG             PT Long Term Goals - 07/08/21 1218       PT LONG TERM GOAL #1   Title Pt will tolerate wearing prostheses all awake hours with no skin issues for improved function.    Baseline 06/29/21: pt continues to have numbness on left side with increased prosthesis wear. This has improved with changes made to socket by prosthetist and is ongoing. No skin issues and pt adjusting socks without cues a this time    Time 6    Period Weeks    Status Revised    Target Date 08/22/21      PT LONG TERM GOAL #2   Title Pt will improve gait speed to >/=3.00 ft/sec with B prostheses and without device in order to indicate safe community ambulation.    Baseline 05/31/21 2.78 ft/sec, improved just not to goal level    Time 6    Period Weeks    Status Revised    Target Date 08/22/21      PT LONG TERM GOAL #3   Title Pt will improve TUG to </=13.5 secs without AD in order to indicate dec fall risk.    Baseline 16.79 secs with tripod cane    Time 6  Period Weeks    Status Revised    Target Date 08/22/21      PT LONG TERM GOAL #4   Title Pt will negotiate up/down 4 steps with LRAD at mod I level in order to enter/exit home safely.    Baseline 06/29/21: min guard to min assist with rail/cane combo    Time 6    Period Weeks    Status Revised    Target Date 08/22/21      PT LONG TERM GOAL #5   Title Pt will negotiate up/down curb and ramp, ambulate 1000' w/ LRAD in order to improve community access.    Baseline 06/29/21: max distance to date ~875 feet on indoor/outdoor surfaces, has began to train on grassy hills, ramps  and curbs with bil prostheses/cane with min guard to min assist    Time 6    Period Weeks    Status Revised    Target Date 08/22/21      PT LONG TERM GOAL #6   Title Pt will negotiate over uneven terrain (grass, mulch, etc) with LRAD (trekking poles if needed) x 500' in order to indicate safe return to hiking.    Baseline 06/29/21: have began to train on steep grassy hills for working towards hiking    Time 6    Period Weeks    Status Revised    Target Date 08/22/21      PT LONG TERM GOAL #7   Title Pt will improve DGI to >/=17/24 in order to indicate dec fall risk.    Baseline 13/24    Time 6    Period Weeks    Status Revised    Target Date 08/22/21            Rico Junker, PT, DPT 07/08/21    12:21 PM    06/21/21 1321  Plan  Clinical Impression Statement Today's skilled session continued to focus on prosthetic training with cane on various surfaces and balance with bil prostheses. No issues noted or reported in session. The pt is making steady progress and should benefit from continued PT to progress toward unmet goals.  Addendum by PT: pt has utilized 4 out of 8 approved PT visits; pt has been unable to attend all approved visits due to being out of town and other conflicting appointments.  Pt continues to make progress towards LTG and continues to be motivated to continue with therapy.  Goals updated; PT to continue to address strength, balance, ROM and endurance impairments to maximize functional mobility independence and decrease risk for falls with bilat prosthetics.  Personal Factors and Comorbidities Comorbidity 3+;Social Background  Comorbidities see above  Examination-Activity Limitations Carry;Caring for Others;Locomotion Level;Lift;Dressing;Bathing;Squat;Stairs;Stand  Examination-Participation Restrictions Community Activity;Meal Prep;Driving;Laundry;Cleaning  Pt will benefit from skilled therapeutic intervention in order to improve on the following deficits  Abnormal gait;Cardiopulmonary status limiting activity;Decreased activity tolerance;Decreased balance;Decreased endurance;Decreased knowledge of precautions;Decreased mobility;Decreased strength;Difficulty walking;Impaired perceived functional ability;Impaired UE functional use;Postural dysfunction;Prosthetic Dependency  Rehab Potential Excellent  PT Frequency 2x / week  PT Duration 6 weeks  PT Treatment/Interventions ADLs/Self Care Home Management;Aquatic Therapy;DME Instruction;Gait training;Stair training;Functional mobility training;Therapeutic activities;Therapeutic exercise;Balance training;Neuromuscular re-education;Patient/family education;Prosthetic Training;Passive range of motion;Energy conservation  PT Next Visit Plan Continue functional strengthening with getting more left hip abductor activation.  continue to work on gait/barriers/balance with bil prostheses with decreased UE support. Balance activities.  PT Home Exercise Plan Access Code: [facility]  Consulted and Agree with Plan of Care Patient;Family member/caregiver  Family Member Consulted mother  Addendum by PT for recertification: Rico Junker, PT, DPT 07/08/21    12:17 PM   Patient will benefit from skilled therapeutic intervention in order to improve the following deficits and impairments:  Abnormal gait, Cardiopulmonary status limiting activity, Decreased activity tolerance, Decreased balance, Decreased endurance, Decreased knowledge of precautions, Decreased mobility, Decreased strength, Difficulty walking, Impaired perceived functional ability, Impaired UE functional use, Postural dysfunction, Prosthetic Dependency  Visit Diagnosis: Other abnormalities of gait and mobility  Muscle weakness (generalized)  Unsteadiness on feet  Abnormal posture     Problem List Patient Active Problem List   Diagnosis Date Noted   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 ([place])    Dehiscence of  amputation stump ([place])    Pacemaker - MDT 09/09/2020   Wound of left leg 08/24/2020   Fatigue 07/31/2020   S/P TVR (tricuspid valve replacement) 05/11/2020   Educated about COVID-19 virus infection 05/11/2020   S/P bilateral below knee amputation ([place]) 05/09/2020   Amputation of right arm ([place]) 05/09/2020   History of bacterial endocarditis 05/02/2020   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   Complete heart block ([place]) 12/11/2017   History of tricuspid valve replacement with bioprosthetic valve 12/11/2017   Opioid use disorder, moderate, in early remission ([place]) 12/11/2017   Depression 12/11/2017    Willow Ora, PTA, [facility] [address] [place], [place] [address] [phone] 06/29/21, 8:20 AM   Rico Junker, PT, DPT 07/08/21    12:21 PM    Name: JERSEY RAVENSCROFT MRN: [id number] Date of Birth: 1976-01-12

## 2021-06-27 ENCOUNTER — Ambulatory Visit: Payer: Medicaid Other | Admitting: Physical Therapy

## 2021-06-29 ENCOUNTER — Ambulatory Visit: Payer: Medicaid Other | Admitting: Physical Therapy

## 2021-06-29 NOTE — Progress Notes (Signed)
Remote pacemaker transmission.   

## 2021-07-03 ENCOUNTER — Ambulatory Visit: Payer: Medicaid Other | Admitting: Physical Therapy

## 2021-07-05 ENCOUNTER — Other Ambulatory Visit: Payer: Self-pay

## 2021-07-05 ENCOUNTER — Ambulatory Visit: Payer: Medicaid Other | Admitting: Physical Therapy

## 2021-07-06 ENCOUNTER — Ambulatory Visit: Payer: Medicaid Other | Admitting: Physical Therapy

## 2021-07-08 NOTE — Addendum Note (Signed)
Addended by: Bufford Lope F on: 07/08/2021 12:24 PM   Modules accepted: Orders

## 2021-07-09 ENCOUNTER — Other Ambulatory Visit: Payer: Self-pay

## 2021-07-09 ENCOUNTER — Ambulatory Visit: Payer: Medicaid Other | Admitting: Internal Medicine

## 2021-07-09 ENCOUNTER — Encounter: Payer: Self-pay | Admitting: Internal Medicine

## 2021-07-09 VITALS — BP 107/74 | HR 70 | Temp 98.7°F | Ht 67.17 in | Wt 157.8 lb

## 2021-07-09 DIAGNOSIS — M25661 Stiffness of right knee, not elsewhere classified: Secondary | ICD-10-CM

## 2021-07-09 DIAGNOSIS — I071 Rheumatic tricuspid insufficiency: Secondary | ICD-10-CM

## 2021-07-09 DIAGNOSIS — Z1329 Encounter for screening for other suspected endocrine disorder: Secondary | ICD-10-CM

## 2021-07-09 DIAGNOSIS — Z125 Encounter for screening for malignant neoplasm of prostate: Secondary | ICD-10-CM

## 2021-07-09 DIAGNOSIS — S58919A Complete traumatic amputation of unspecified forearm, level unspecified, initial encounter: Secondary | ICD-10-CM | POA: Insufficient documentation

## 2021-07-09 DIAGNOSIS — F339 Major depressive disorder, recurrent, unspecified: Secondary | ICD-10-CM

## 2021-07-09 DIAGNOSIS — K439 Ventral hernia without obstruction or gangrene: Secondary | ICD-10-CM | POA: Diagnosis not present

## 2021-07-09 DIAGNOSIS — M25662 Stiffness of left knee, not elsewhere classified: Secondary | ICD-10-CM

## 2021-07-09 NOTE — Progress Notes (Signed)
New Patient Office Visit  Subjective:  Patient ID: Wesley Fuentes, male    DOB: April 19, 1976  Age: 45 y.o. MRN: [id number]  by Levora DredgeHelberg, Justin, MD for follow up of tricuspid valve replacement and pacemaker placement.     He presented for follow up with a history of SBE.  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       Pt is here to establish care he lives in [place]. Was seeing  Someone in [place] , moved to [place] x 2 yrs.  [place], had TV replaced with pig valve x 2020. Had Complications when he moved to [place] and had   Per cardiology notes from 2018,  patient has a history of IV drug abuse develop meth MRSA had septic emboli on vegetations on the tricuspid valve underwent a valve replacement with a prosthetic [pig valve] per patient's verbal record.Marland Kitchen.  He developed complete heart block which needed a pacemaker plate placement.  [place] course of antibiotics and [place] with [place] in 2018 when he was seen by cardiology last.    On chart review patient has oxycodone and OxyContin abuse and has had tricuspid valve endocarditis.  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     Has had triple amputation - Bilateral lower limb amputee, unilateral upper limb amputee Ambulates with a cane , getting PT -   Smokes CBD for pain  Co  Abdominal bulging x 2020 worsening on exercising.     Depression        This is a chronic (was on zoloft , switched to remeron now, no appetite now. feels better depression wise) problem.  Episode onset: was out of meds x 2 weeks , couldnt get in touch with last pcp , was on remeron 15 mg , was on for a month then out -   CC:  Chief Complaint  Patient presents with   New Patient (Initial Visit)   triple amputation    B/L below knee  amputation, and right hand amputation due to gangrene    HPI Wesley Fuentes presents for   Past Medical History:  Diagnosis Date   CHB (complete heart block) (HCC)    Depression 12/11/2017   Endocarditis of tricuspid valve    Hepatitis C    History of complete heart block 12/11/2017   History of tricuspid valve replacement with bioprosthetic valve 12/11/2017   MRSA (methicillin resistant Staphylococcus aureus)    Opioid use disorder, moderate, in early remission (HCC) 12/11/2017   Presence of permanent cardiac pacemaker    Med[employer]    Past Surgical History:  Procedure Laterality Date   BELOW KNEE LEG AMPUTATION Bilateral    PACEMAKER REVISION     moved from chest to abd.    Permanent pacemaker placement     [facility]   right hand amputation     STUMP REVISION Right 10/25/2020   Procedure: REVISION RIGHT BELOW KNEE AMPUTATION;  Surgeon: Nadara Mustarduda, Marcus V, MD;  [facility];  Service: Orthopedics;  Laterality: Right;   Tricuspid valve replacement     On 11/17/17-replacement of tricuspid valve with a 29 mm Medtronic Mosaic mitral prosthesis.    Family History  Problem Relation Age of Onset  Diabetes Maternal Grandmother    Diabetes Maternal Grandfather    Cancer Maternal Grandfather        oral   Cervical cancer Mother     Social History   Socioeconomic History   Marital status: Single    Spouse name: Not on file   Number of children: Not on file   Years of education: Not on file   Highest education level: Not on file  Occupational History   Not on file  Tobacco Use   Smoking status: Every Day    Packs/day: 0.25    Pack years: 0.00    Types: Cigarettes   Smokeless tobacco: Never  Vaping Use   Vaping Use: Former  Substance and Sexual Activity   Alcohol use: Not Currently   Drug use: Not Currently    Types: Other-see comments, Amphetamines    Comment: on methadone; former user/   Uses CBD OIL    Sexual activity: Not Currently  Other Topics Concern   Not on file  Social History Narrative   Not on file   Social Determinants of Health   Financial Resource Strain: Not on file  Food Insecurity: Not on file  Transportation Needs: Not on file  Physical Activity: Not on file  Stress: Not on file  Social Connections: Not on file  Intimate Partner Violence: Not on file    ROS Review of Systems  Psychiatric/Behavioral:  Positive for depression.    Objective:   Today's Vitals: BP 107/74   Pulse 70   Temp 98.7 F (37.1 C) (Oral)   Ht 5' 7.17" (1.706 m)   Wt 157 lb 12.8 oz (71.6 kg) Comment: with prosthetics  SpO2 97%   BMI 24.59 kg/m   Physical Exam Vitals and nursing note reviewed.  Constitutional:      General: He is not in acute distress.    Appearance: Normal appearance. He is not ill-appearing or diaphoretic.  HENT:     Head: Normocephalic and atraumatic.     Right Ear: Tympanic membrane and external ear normal. There is no impacted cerumen.     Left Ear: External ear normal.     Nose: No congestion or rhinorrhea.     Mouth/Throat:     Pharynx: No oropharyngeal exudate or posterior oropharyngeal erythema.  Eyes:     Conjunctiva/sclera: Conjunctivae normal.     Pupils: Pupils are equal, round, and reactive to light.  Cardiovascular:     Rate and Rhythm: Normal rate and regular rhythm.     Heart sounds: Murmur heard.    No friction rub. No gallop.  Pulmonary:     Effort: No respiratory distress.     Breath sounds: No stridor. No wheezing or rhonchi.  Chest:     Chest wall: No tenderness.  Abdominal:     General: Abdomen is flat. Bowel sounds are normal.     Palpations: Abdomen is soft. There is no mass.     Tenderness: There is no abdominal tenderness.  Musculoskeletal:        General: Deformity present.     Cervical back: Normal range of motion and neck supple. No rigidity or tenderness.     Left lower leg: No edema.     Comments: Bil lower limb BKA  and Unil upper limb amputee  Skin:    General: Skin is warm and dry.  Neurological:     Mental Status: He is alert.   Assessment & Plan:   Depression : [facility] [place] Was placed on chantix in June to quit smoking.  need to obtain notes.  Will refer pt as well.   Smoking cessation Smoking cessation advised.continues to smoke. more than > 5 - 10 mins of time was spent with pt regarding smoking cessation and complications.   TV replacement / fu and mx per cards.  Pt seen Dr. Antoine Poche in dec no fu since then.     Pain in stump / pain on mobilizing / extednign bil knees  Check xrays will need to fu with ortho for such     Problem List Items Addressed This Visit       Cardiovascular and Mediastinum   Tricuspid regurgitation   Relevant Orders   Comprehensive metabolic panel   CBC with Differential/Platelet   Lipid panel   Other Visit Diagnoses     Knee joint stiffness, bilateral    -  Primary   Relevant Orders   DG Knee Complete 4 Views Right   DG Knee Complete 4 Views Left   Comprehensive metabolic panel   CBC with Differential/Platelet   Depression, recurrent (HCC)       Relevant Medications   buPROPion (WELLBUTRIN XL) 300 MG 24 hr tablet   Other Relevant Orders   Ambulatory referral to Psychiatry   Comprehensive metabolic panel   CBC with Differential/Platelet   Hernia of abdominal wall       Relevant Orders   Ambulatory referral to General Surgery   Comprehensive metabolic panel   CBC with Differential/Platelet   Screening PSA (prostate specific antigen)       Relevant Orders   PSA Total+%Free (Serial)   Comprehensive metabolic panel   CBC with Differential/Platelet   Thyroid disorder screening       Relevant Orders   Comprehensive metabolic panel   CBC with Differential/Platelet   Thyroid Panel With TSH       Outpatient Encounter Medications as of 07/09/2021  Medication Sig   Acetaminophen 325 MG CAPS Take 650 mg by  mouth every 6 (six) hours as needed (pain).    Ascorbic Acid (VITAMIN C WITH ROSE HIPS) 500 MG tablet Take 1 tablet (500 mg total) by mouth daily.   aspirin EC 81 MG tablet Take 81 mg by mouth daily. Swallow whole.   buPROPion (WELLBUTRIN XL) 150 MG 24 hr tablet Take 150 mg by mouth daily.   minocycline (MINOCIN) 100 MG capsule Take 100 mg by mouth 2 (two) times daily.   mirtazapine (REMERON) 15 MG tablet Take 1 tablet (15 mg total) by mouth at bedtime.   Multiple Vitamins-Minerals (MULTI COMPLETE/IRON) TABS Take by mouth.   buprenorphine-naloxone (SUBOXONE) 8-2 mg SUBL SL tablet Place 1 tablet under the tongue 2 (two) times daily. (Patient not taking: Reported on 07/09/2021)   buPROPion (WELLBUTRIN XL) 300 MG 24 hr tablet Take 300 mg by mouth every morning.   ibuprofen (ADVIL) 800 MG tablet Take 800 mg by mouth 3 (three) times daily as needed.   methadone (DOLOPHINE) 10 MG/ML solution Take 70 mg by mouth daily. (Patient not taking: No sig reported)   naloxone (NARCAN) nasal spray 4 mg/0.1 mL Administer 1 spray into 1 nostril once as needed. If no response in 3 minutes repeat with a new naloxone spray in the alternate nostril. (Patient not taking: Reported on 07/09/2021)   sertraline (ZOLOFT) 100 MG tablet Take 100 mg by mouth daily. (Patient not taking: No sig reported)   varenicline (CHANTIX STARTING MONTH PAK) 0.5 MG X 11 &  1 MG X 42 tablet Take one 0.5 mg tablet by mouth once daily for 3 days, then increase to one 0.5 mg tablet twice daily for 4 days, then increase to one 1 mg tablet twice daily. (Patient not taking: Reported on 07/09/2021)   No facility-administered encounter medications on file as of 07/09/2021.    Follow-up: Return in about 6 weeks (around 08/20/2021).   Loura Pardon, MD

## 2021-07-09 NOTE — Progress Notes (Deleted)
BP 107/74   Pulse 70   Temp 98.7 F (37.1 C) (Oral)   Ht 5' 7.17" (1.706 m)   Wt 157 lb 12.8 oz (71.6 kg) Comment: with prosthetics  SpO2 97%   BMI 24.59 kg/m    Subjective:    Patient ID: Wesley Fuentes, male    DOB: May 11, 1976, 45 y.o.   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 Interim medical history since our last visit reviewed. Allergies and medications reviewed and updated.  Review of Systems  Per HPI unless specifically indicated above     Objective:    BP 107/74   Pulse 70   Temp 98.7 F (37.1 C) (Oral)   Ht 5' 7.17" (1.706 m)   Wt 157 lb 12.8 oz (71.6 kg) Comment: with prosthetics  SpO2 97%   BMI 24.59 kg/m   Wt Readings from Last 3 Encounters:  07/09/21 157 lb 12.8 oz (71.6 kg)  03/27/21 157 lb 2 oz (71.3 kg)  12/18/20 140 lb (63.5 kg)    Physical Exam  Results for orders placed or performed in visit on 06/06/21  CUP PACEART REMOTE DEVICE CHECK  Result Value Ref Range   Date Time Interrogation Session [id number]    Pulse Generator Manufacturer MERM    Pulse Gen Model [id number] Adine Madura SR MRI    Pulse Gen Serial Number [id number]    Cl[facility] Name [facility]    Implantable Pulse Generator Type Implantable Pulse Generator    Implantable Pulse Generator Implant Date [id number]    Implantable Lead Manufacturer [facility]    Implantable Lead Model [id number] CapSure Epi    Implantable Lead Serial Number [id number]    Implantable Lead Implant Date [id number]    Implantable Lead Location [id number]    Lead Channel  Setting Sensing Sensitivity 1.2 mV   Lead Channel Setting Pacing Pulse Width 0.4 ms   Lead Channel Setting Pacing Amplitude 2.25 V   Lead Channel Impedance Value 798 ohm   Lead Channel Impedance Value 570 ohm   Lead Channel Sensing Intrinsic Amplitude 9.125 mV   Lead Channel Sensing Intrinsic Amplitude 9.125 mV   Lead Channel Pacing Threshold Amplitude 1.125 V   Lead Channel Pacing Threshold Pulse Width 0.4 ms   Battery Status OK    Battery Remaining Longevity 150 mo   Battery Voltage 3.05 V   Brady Statistic RV Percent Paced 99.94 %        Current Outpatient Medications:    Acetaminophen 325 MG CAPS, Take 650 mg by mouth every 6 (six) hours as needed (pain). , Disp: , Rfl:    Ascorbic Acid (VITAMIN C WITH ROSE HIPS) 500 MG tablet, Take 1 tablet (500 mg total) by mouth daily., Disp: 30 tablet, Rfl: 1   aspirin EC 81 MG tablet, Take 81 mg by mouth daily. Swallow whole., Disp: , Rfl:    buPROPion (WELLBUTRIN XL) 150 MG 24 hr tablet, Take 150 mg by mouth daily., Disp: , Rfl:    minocycline (  MINOCIN) 100 MG capsule, Take 100 mg by mouth 2 (two) times daily., Disp: , Rfl:    mirtazapine (REMERON) 15 MG tablet, Take 1 tablet (15 mg total) by mouth at bedtime., Disp: 90 tablet, Rfl: 2   Multiple Vitamins-Minerals (MULTI COMPLETE/IRON) TABS, Take by mouth., Disp: , Rfl:    buprenorphine-naloxone (SUBOXONE) 8-2 mg SUBL SL tablet, Place 1 tablet under the tongue 2 (two) times daily. (Patient not taking: Reported on 07/09/2021), Disp: , Rfl:    buPROPion (WELLBUTRIN XL) 300 MG 24 hr tablet, Take 300 mg by mouth every morning., Disp: , Rfl:    ibuprofen (ADVIL) 800 MG tablet, Take 800 mg by mouth 3 (three) times daily as needed., Disp: , Rfl:    methadone (DOLOPHINE) 10 MG/ML solution, Take 70 mg by mouth daily. (Patient not taking: No sig reported), Disp: , Rfl:    naloxone (NARCAN) nasal spray 4 mg/0.1 mL, Administer 1 spray into 1 nostril once as needed. If no response in 3 minutes repeat with a  new naloxone spray in the alternate nostril. (Patient not taking: Reported on 07/09/2021), Disp: , Rfl:    sertraline (ZOLOFT) 100 MG tablet, Take 100 mg by mouth daily. (Patient not taking: No sig reported), Disp: , Rfl:    varenicline (CHANTIX STARTING MONTH PAK) 0.5 MG X 11 & 1 MG X 42 tablet, Take one 0.5 mg tablet by mouth once daily for 3 days, then increase to one 0.5 mg tablet twice daily for 4 days, then increase to one 1 mg tablet twice daily. (Patient not taking: Reported on 07/09/2021), Disp: 53 tablet, Rfl: 0    Assessment & Plan:   Problem List Items Addressed This Visit   None    No orders of the defined types were placed in this encounter.    No orders of the defined types were placed in this encounter.    Follow up plan: No follow-ups on file.  Health Maintenance :***  Mammogram/ Paps smear: DEXA: PSA :  Cscope : Pneumonia vaccine :{pneumonia vaccine:25278} FLu vaccine :

## 2021-07-17 ENCOUNTER — Telehealth: Payer: Self-pay | Admitting: Cardiology

## 2021-07-17 NOTE — Telephone Encounter (Signed)
    Pt c/o medication issue:  1. Name of Medication: minocycline  2. How are you currently taking this medication (dosage and times per day)?   3. Are you having a reaction (difficulty breathing--STAT)?   4. What is your medication issue? Pt said Dr. Antoine Poche prescribed this meds when he had a heart valve replacement, he said he needs to be on it for life. He needs refill but its not on pt's med list

## 2021-07-17 NOTE — Telephone Encounter (Signed)
Last OV 11/2020 with Dr. Antoine Poche TVR:  He has severe TV stenosis.   However, he and I have had long discussions about this. He has no symptoms related to this. I reviewed current guideline recommendations and we should manage this conservatively as long as he is not having any problems. He has endocarditis prophylaxis and he understands good dental hygiene. Has been getting his teeth removed. He would let me know if he ever has symptoms such as abdominal distention again   Was taking minocycline 100 mg BID --med removed from list by PCP 07/09/21 "completed course"  Routed to MD to review for refill

## 2021-07-18 MED ORDER — MINOCYCLINE HCL 100 MG PO CAPS: 100.0000 mg | ORAL_CAPSULE | Freq: Two times a day (BID) | ORAL | 0 refills | Status: AC

## 2021-07-18 NOTE — Telephone Encounter (Signed)
Left message for pt to call.

## 2021-07-18 NOTE — Telephone Encounter (Signed)
Spoke with pt mother, she was made aware the medication was discontinued from his chart and it was reported as completed course. She reports that was a mistake and he has been taking this medication since he was septic. Follow up scheduled per recall and refill sent to the pharmacy.

## 2021-07-18 NOTE — Telephone Encounter (Signed)
Patient called back to follow up on refill request. Patient was told by his pharmacy that the medication was not ready yet.

## 2021-07-19 ENCOUNTER — Ambulatory Visit: Payer: Medicaid Other | Admitting: General Surgery

## 2021-07-25 ENCOUNTER — Ambulatory Visit: Payer: Medicaid Other

## 2021-07-27 ENCOUNTER — Ambulatory Visit: Payer: Medicaid Other

## 2021-07-31 ENCOUNTER — Encounter: Payer: Self-pay | Admitting: Physical Therapy

## 2021-07-31 ENCOUNTER — Other Ambulatory Visit: Payer: Self-pay

## 2021-07-31 ENCOUNTER — Ambulatory Visit: Payer: Medicaid Other | Attending: Orthopedic Surgery | Admitting: Physical Therapy

## 2021-07-31 DIAGNOSIS — R293 Abnormal posture: Secondary | ICD-10-CM | POA: Diagnosis present

## 2021-07-31 DIAGNOSIS — M6281 Muscle weakness (generalized): Secondary | ICD-10-CM

## 2021-07-31 DIAGNOSIS — R2681 Unsteadiness on feet: Secondary | ICD-10-CM

## 2021-07-31 DIAGNOSIS — R2689 Other abnormalities of gait and mobility: Secondary | ICD-10-CM | POA: Diagnosis present

## 2021-07-31 DIAGNOSIS — R262 Difficulty in walking, not elsewhere classified: Secondary | ICD-10-CM

## 2021-07-31 NOTE — Patient Instructions (Signed)
Access Code: [email or web address] URL: [email or web address] Date: 07/31/2021 Prepared by: Bufford Lope  Exercises Standing Hip Abduction with Counter Support - 2 x daily - 5 x weekly - 1 sets - 10 reps Standing Marching - 2 x daily - 5 x weekly - 1 sets - 10 reps Side Stepping with Counter Support - 1 x daily - 5 x weekly - 1 sets - 3 reps Backward Walking with Counter Support - 1 x daily - 5 x weekly - 1 sets - 3 reps Sidelying Hip Abduction with Resistance at Thighs - 1 x daily - 5 x weekly - 10 reps Mini-squat with band - 1 x daily - 5 x weekly - 3 sets - 5 reps Squat at Table - 1 x daily - 7 x weekly - 2 sets - 12 reps Forward Step Up (Mirrored) - 1 x daily - 7 x weekly - 2 sets - 10 reps Wall Quarter Squat - 1 x daily - 7 x weekly - 1 sets - 5 reps

## 2021-07-31 NOTE — Therapy (Signed)
[facility] [address] [place], [place], [address] Phone: [phone]   Fax:  [phone]  Physical Therapy Treatment  Patient Details  Name: Wesley Fuentes MRN: [id number] Date of Birth: 04-Mar-1976 Referring Provider (PT): Meridee Score, MD [facility], [place])   Encounter Date: 07/31/2021   PT End of Session - 07/31/21 1535     Visit Number 10    Number of Visits 21    Date for PT Re-Evaluation 08/22/21    Authorization Type MCD-requesting 12 more visits    Authorization - Visit Number 1    Authorization - Number of Visits 12    PT Start Time 1448    PT Stop Time 1529    PT Time Calculation (min) 41 min    Activity Tolerance Patient tolerated treatment well    Behavior During Therapy [facility] for tasks assessed/performed             Past Medical History:  Diagnosis Date   CHB (complete heart block) ([place])    Depression 12/11/2017   Endocarditis of tricuspid valve    Hepatitis C    History of complete heart block 12/11/2017   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    Permanent pacemaker placement     On 11/24/17-had [facility] Jude dual-chamber permanent pacemaker placed.   right hand amputation     STUMP REVISION Right 10/25/2020   Procedure: REVISION RIGHT BELOW KNEE AMPUTATION;  Surgeon: Newt Minion, MD;  Location: [facility];  Service: Orthopedics;  Laterality: Right;   Tricuspid valve replacement     On 11/17/17-replacement of tricuspid valve with a 29 mm Medtronic Mosaic mitral prosthesis.    There were no vitals filed for this visit.    Subjective Assessment - 07/31/21 1454     Subjective Nothing new to report.  Still doing exercises.  LE have adjusted to the heat pretty well, not having as much sweating.  No wounds and no falls.  Still having a hard time with negotiating stairs and strengthening LLE.    Patient is accompained by: Family member   mom   How long can you stand comfortably? 15-20 mins    How long can you walk comfortably? 20 mins    Patient Stated Goals "I want to be able to hike, bike, drive."                [facility] PT Assessment - 07/31/21 1501       Transfers   Sit to Stand 6: Modified independent (Device/Increase time)    Stand to Sit 6: Modified independent (Device/Increase time)      Ambulation/Gait   Ambulation/Gait Assistance 6: Modified independent (Device/Increase time)    Ambulation/Gait Assistance Details with and without cane on level, indoor surfaces    Ambulation Distance (Feet) 500 Feet    Assistive device Straight cane;Prostheses;None    Gait Pattern Step-through pattern;Wide base of support    Ambulation Surface Level;Indoor    Stairs Yes    Stairs Assistance 4: Min assist;5: Supervision    Stairs Assistance Details (indicate cue type and reason) with one  rail pt able to perform step to sequence with supervision and verbal cues for sequencing.  Continued to instruct pt on how to perform alternating sequence with one rail; greater difficulty leading with LLE to ascend and leading with RLE when descending due to LLE weakness    Stair Management Technique One rail Right;One rail Left;Alternating pattern;Step to pattern;Forwards    Number of Stairs 8    Height of Stairs 6      Standardized Balance Assessment   Standardized Balance Assessment Dynamic Gait Index;10 meter walk test    10 Meter Walk 10.46 seconds without cane; .96 m/sec      Dynamic Gait Index   Level Surface Mild Impairment    Change in Gait Speed Mild Impairment    Gait with Horizontal Head Turns Mild Impairment     Gait with Vertical Head Turns Mild Impairment    Gait and Pivot Turn Mild Impairment    Step Over Obstacle Mild Impairment    Step Around Obstacles Mild Impairment    Steps Moderate Impairment    Total Score 15    DGI comment: 15/24      Timed Up and Go Test   TUG Normal TUG    Normal TUG (seconds) 11.8    TUG Comments no cane                [facility] Adult PT Treatment/Exercise - 07/31/21 1501       Exercises   Exercises Other Exercises    Other Exercises  updated HEP as below.  Added gravity resisted strengthening exercises for LLE.  Demonstrated to pt how to perform safely at home.            Access Code: [email or web address] URL: [email or web address] Date: 07/31/2021 Prepared by: Misty Stanley  Exercises Standing Hip Abduction with Counter Support - 2 x daily - 5 x weekly - 1 sets - 10 reps Standing Marching - 2 x daily - 5 x weekly - 1 sets - 10 reps Side Stepping with Counter Support - 1 x daily - 5 x weekly - 1 sets - 3 reps Backward Walking with Counter Support - 1 x daily - 5 x weekly - 1 sets - 3 reps Sidelying Hip Abduction with Resistance at Thighs - 1 x daily - 5 x weekly - 10 reps Mini-squat with band - 1 x daily - 5 x weekly - 3 sets - 5 reps Squat at Table - 1 x daily - 7 x weekly - 2 sets - 12 reps Forward Step Up (Mirrored) - 1 x daily - 7 x weekly - 2 sets - 10 reps Wall Quarter Squat - 1 x daily - 7 x weekly - 1 sets - 5 reps     PT Education - 08/01/21 0955     Education Details progress towards goals, progressed HEP, plan for future visits    Person(s) Educated Patient;Parent(s)    Methods Explanation;Demonstration;Handout    Comprehension Verbalized understanding;Returned demonstration              PT Short Term Goals - 07/08/21 1217       PT SHORT TERM GOAL #1   Title = LTG               PT Long Term Goals - 07/31/21 1654       PT LONG TERM GOAL #1   Title Pt will tolerate wearing prostheses all awake hours with no  skin issues for improved function.  Time 6    Period Weeks    Status Achieved      PT LONG TERM GOAL #2   Title Pt will improve gait speed to >/=3.00 ft/sec with B prostheses and without device in order to indicate safe community ambulation.    Baseline 3.1 ft/sec without cane    Time 6    Period Weeks    Status Achieved      PT LONG TERM GOAL #3   Title Pt will improve TUG to </=13.5 secs without AD in order to indicate dec fall risk.    Baseline 11 seconds without AD    Time 6    Period Weeks    Status Achieved      PT LONG TERM GOAL #4   Title Pt will negotiate up/down 4 steps with LRAD at mod I level in order to enter/exit home safely.    Baseline 06/29/21: min guard to min assist with rail/cane combo    Time 6    Period Weeks    Status On-going    Target Date 08/22/21      PT LONG TERM GOAL #5   Title Pt will negotiate up/down curb and ramp, ambulate 1000' w/ LRAD in order to improve community access.    Baseline 06/29/21: max distance to date ~875 feet on indoor/outdoor surfaces, has began to train on grassy hills, ramps and curbs with bil prostheses/cane with min guard to min assist    Time 6    Period Weeks    Status On-going    Target Date 08/22/21      PT LONG TERM GOAL #6   Title Pt will negotiate over uneven terrain (grass, mulch, etc) with LRAD (trekking poles if needed) x 500' in order to indicate safe return to hiking.    Baseline 06/29/21: have began to train on steep grassy hills for working towards hiking    Time 6    Period Weeks    Status On-going    Target Date 08/22/21      PT LONG TERM GOAL #7   Title Pt will improve DGI to >/=17/24 in order to indicate dec fall risk.    Baseline 13/24 > 15/24    Time 6    Period Weeks    Status On-going    Target Date 08/22/21                   Plan - 07/31/21 1656     Clinical Impression Statement Pt is making excellent progress; finished assessment of progress towards LTG today with pt meeting 3  LTG.  Pt is tolerating all day prosthetic wear (with intermittent breaks to allow skin to breathe and liner to dry) without any skin issues, has met TUG and gait velocity goals without cane and demonstrates improvement towards DGI goal but not to goal level yet.  Pt continues to have difficulty with stair negotiation, LLE weakness and impaired gait and balance when ambulating without UE support.  Updated and progressed HEP.  Will continue to address and progress towards updated LTG.    Personal Factors and Comorbidities Comorbidity 3+;Social Background    Comorbidities see above    Examination-Activity Limitations Carry;Caring for Others;Locomotion Level;Lift;Dressing;Bathing;Squat;Stairs;Stand    Examination-Participation Restrictions Community Activity;Meal Prep;Driving;Laundry;Cleaning    Rehab Potential Excellent    PT Frequency 2x / week    PT Duration 6 weeks    PT Treatment/Interventions ADLs/Self Care Home Management;Aquatic Therapy;DME Instruction;Gait training;Stair training;Functional mobility training;Therapeutic activities;Therapeutic exercise;Balance training;Neuromuscular  re-education;Patient/family education;Prosthetic Training;Passive range of motion;Energy conservation    PT Next Visit Plan Taping for patellar tracking.  Continue LLE quad, hamstring, glute strengthening.  Wants to work out at [employer] - focus on safely using machines/leg press if able.  Stair negotiation with one rail, alternating sequence.  Balance activities without UE support.  Gait outside, on uneven terrain.    PT Home Exercise Plan Access Code: [facility]    Consulted and Agree with Plan of Care Patient;Family member/caregiver    Family Member Consulted mother             Patient will benefit from skilled therapeutic intervention in order to improve the following deficits and impairments:  Abnormal gait, Cardiopulmonary status limiting activity, Decreased activity tolerance, Decreased balance, Decreased  endurance, Decreased knowledge of precautions, Decreased mobility, Decreased strength, Difficulty walking, Impaired perceived functional ability, Impaired UE functional use, Postural dysfunction, Prosthetic Dependency  Visit Diagnosis: Other abnormalities of gait and mobility  Unsteadiness on feet  Muscle weakness (generalized)  Abnormal posture  Difficulty in walking, not elsewhere classified     Problem List Patient Active Problem List   Diagnosis Date Noted   Amputation of forearm ([place]) 07/09/2021   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

## 2021-07-31 NOTE — Progress Notes (Deleted)
Electrophysiology Office Note Date: 07/31/2021  ID:  RUPERT Fuentes, DOB 15-Aug-1976, MRN [id number]  PCP: Loura Pardon, MD Primary Cardiologist: Rollene Rotunda, MD Electrophysiologist: Sherryl Manges, MD   CC: Pacemaker follow-up  Wesley Fuentes is a 45 y.o. male seen today for Sherryl Manges, MD for routine electrophysiology followup.  Since last being seen in our clinic the patient reports doing ***.  he denies chest pain, palpitations, dyspnea, PND, orthopnea, nausea, vomiting, dizziness, syncope, edema, weight gain, or early satiety.  Device History: MDT single chamber PPM (RV lead is epicardial), implanted 03/06/2020 in [place] This is NOT MRI conditional Previously had SJM dual chamber PPM, implanted 11/24/2017 (implanted in [place]) THIS SYSTEM EXTRACTED 3/8/22021  Past Medical History:  Diagnosis Date   CHB (complete heart block) ([facility])    Depression 12/11/2017   Endocarditis of tricuspid valve    Hepatitis C    History of complete heart block 12/11/2017   History of tricuspid valve replacement with bioprosthetic valve 12/11/2017   MRSA (methicillin resistant Staphylococcus aureus)    Opioid use disorder, moderate, in early remission ([facility]) 12/11/2017   Presence of permanent cardiac pacemaker    MedTronic   Past Surgical History:  Procedure Laterality Date   BELOW KNEE LEG AMPUTATION Bilateral    PACEMAKER REVISION     moved from chest to abd.    Permanent pacemaker placement     On 11/24/17-had [facility] Jude dual-chamber permanent pacemaker placed.   right hand amputation     STUMP REVISION Right 10/25/2020   Procedure: REVISION RIGHT BELOW KNEE AMPUTATION;  Surgeon: [name], MD;  [facility];  Service: Orthopedics;  Laterality: Right;   Tricuspid valve replacement     On 11/17/17-replacement of tricuspid valve with a 29 mm Medtronic Mosaic mitral prosthesis.    Current Outpatient Medications  Medication Sig Dispense Refill   Acetaminophen 325  MG CAPS Take 650 mg by mouth every 6 (six) hours as needed (pain).      Ascorbic Acid (VITAMIN C WITH ROSE HIPS) 500 MG tablet Take 1 tablet (500 mg total) by mouth daily. 30 tablet 1   aspirin EC 81 MG tablet Take 81 mg by mouth daily. Swallow whole.     buPROPion (WELLBUTRIN XL) 300 MG 24 hr tablet Take 300 mg by mouth every morning.     ibuprofen (ADVIL) 800 MG tablet Take 800 mg by mouth 3 (three) times daily as needed.     minocycline (MINOCIN) 100 MG capsule Take 1 capsule (100 mg total) by mouth 2 (two) times daily. 180 capsule 0   mirtazapine (REMERON) 15 MG tablet Take 1 tablet (15 mg total) by mouth at bedtime. 90 tablet 2   Multiple Vitamins-Minerals (MULTI COMPLETE/IRON) TABS Take by mouth.     varenicline (CHANTIX STARTING MONTH PAK) 0.5 MG X 11 & 1 MG X 42 tablet Take one 0.5 mg tablet by mouth once daily for 3 days, then increase to one 0.5 mg tablet twice daily for 4 days, then increase to one 1 mg tablet twice daily. (Patient not taking: Reported on 07/09/2021) 53 tablet 0   No current facility-administered medications for this visit.    Allergies:   Patient has no known allergies.   Social History: Social History   Socioeconomic History   Marital status: Single    Spouse name: Not on file   Number of children: Not on file   Years of education: Not on file   Highest education level:  Not on file  Occupational History   Not on file  Tobacco Use   Smoking status: Every Day    Packs/day: 0.25    Types: Cigarettes   Smokeless tobacco: Never  Vaping Use   Vaping Use: Former  Substance and Sexual Activity   Alcohol use: Not Currently   Drug use: Not Currently    Types: Other-see comments, Amphetamines    Comment: on methadone; former user/   Uses CBD OIL   Sexual activity: Not Currently  Other Topics Concern   Not on file  Social History Narrative   Not on file   Social Determinants of Health   Financial Resource Strain: Not on file  Food Insecurity: Not on  file  Transportation Needs: Not on file  Physical Activity: Not on file  Stress: Not on file  Social Connections: Not on file  Intimate Partner Violence: Not on file    Family History: Family History  Problem Relation Age of Onset   Diabetes Maternal Grandmother    Diabetes Maternal Grandfather    Cancer Maternal Grandfather        oral   Cervical cancer Mother      Review of Systems: All other systems reviewed and are otherwise negative except as noted above.  Physical Exam: There were no vitals filed for this visit.   GEN- The patient is well appearing, alert and oriented x 3 today.   HEENT: normocephalic, atraumatic; sclera clear, conjunctiva pink; hearing intact; oropharynx clear; neck supple  Lungs- Clear to ausculation bilaterally, normal work of breathing.  No wheezes, rales, rhonchi Heart- Regular rate and rhythm, no murmurs, rubs or gallops  GI- soft, non-tender, non-distended, bowel sounds present  Extremities- no clubbing or cyanosis. No edema MS- no significant deformity or atrophy Skin- warm and dry, no rash or lesion; PPM pocket well healed Psych- euthymic mood, full affect Neuro- strength and sensation are intact  PPM Interrogation- reviewed in detail today,  See PACEART report  EKG:  EKG is ordered today. The ekg ordered today shows ***  Recent Labs: 10/25/2020: ALT 14; BUN 20; Creatinine, Ser 0.83; Hemoglobin 13.1; Platelets 156; Potassium 4.2; Sodium 137   Wt Readings from Last 3 Encounters:  07/09/21 157 lb 12.8 oz (71.6 kg)  03/27/21 157 lb 2 oz (71.3 kg)  12/18/20 140 lb (63.5 kg)     Other studies Reviewed: Additional studies/ records that were reviewed today include: Previous EP office notes, Previous remote checks, Most recent labwork.   Assessment and Plan:  1. CHB s/p Medtronic PPM (current device, see device history above)  Normal PPM function See Pace Art report No changes today  2. H/o endocarditis with septic emboli Stable.    3. Heroin addiction- relapse and discovery  4. S/p B BKA and R forearm amputation ***  Current medicines are reviewed at length with the patient today.   The patient {ACTIONS; HAS/DOES NOT HAVE:19233} concerns regarding his medicines.  The following changes were made today:  {NONE DEFAULTED:18576}  Labs/ tests ordered today include: *** No orders of the defined types were placed in this encounter.    Disposition:   Follow up with {Blank single:19197::"Dr. Allred","Dr. Amada Jupiter. Klein","Dr. Camnitz","Dr. Lambert","EP APP"} in *** {Blank single:19197::"Months","Weeks"}    Signed, Graciella Freer, PA-C  07/31/2021 1:14 PM  [facility] [address] [place] [address] [phone] (office) [phone] (fax)

## 2021-08-01 ENCOUNTER — Ambulatory Visit: Payer: Medicaid Other

## 2021-08-01 ENCOUNTER — Encounter: Payer: Medicaid Other | Admitting: Student

## 2021-08-01 DIAGNOSIS — Z89511 Acquired absence of right leg below knee: Secondary | ICD-10-CM

## 2021-08-01 DIAGNOSIS — Z8679 Personal history of other diseases of the circulatory system: Secondary | ICD-10-CM

## 2021-08-01 DIAGNOSIS — I442 Atrioventricular block, complete: Secondary | ICD-10-CM

## 2021-08-01 DIAGNOSIS — I070 Rheumatic tricuspid stenosis: Secondary | ICD-10-CM

## 2021-08-01 DIAGNOSIS — S48911D Complete traumatic amputation of right shoulder and upper arm, level unspecified, subsequent encounter: Secondary | ICD-10-CM

## 2021-08-02 ENCOUNTER — Ambulatory Visit: Payer: Medicaid Other | Admitting: Physical Therapy

## 2021-08-02 ENCOUNTER — Other Ambulatory Visit: Payer: Self-pay

## 2021-08-02 DIAGNOSIS — R2681 Unsteadiness on feet: Secondary | ICD-10-CM

## 2021-08-02 DIAGNOSIS — R2689 Other abnormalities of gait and mobility: Secondary | ICD-10-CM

## 2021-08-02 DIAGNOSIS — R262 Difficulty in walking, not elsewhere classified: Secondary | ICD-10-CM

## 2021-08-02 DIAGNOSIS — R293 Abnormal posture: Secondary | ICD-10-CM

## 2021-08-02 DIAGNOSIS — M6281 Muscle weakness (generalized): Secondary | ICD-10-CM

## 2021-08-02 NOTE — Therapy (Signed)
[facility] [address] [place], [place], [address] Phone: [phone]   Fax:  [phone]  Physical Therapy Treatment  Patient Details  Name: Wesley Fuentes MRN: [id number] Date of Birth: 13-May-1976 Referring Provider (PT): Aldean Baker, MD [facility] [place], [place])   Encounter Date: 08/02/2021   PT End of Session - 08/02/21 1541     Visit Number 11    Number of Visits 21    Date for PT Re-Evaluation 08/22/21    Authorization Type MCD-requesting 12 more visits    Authorization Time Period 12 visits from 07/25/21 - 09/04/21    Authorization - Visit Number 2    Authorization - Number of Visits 12    PT Start Time 1535    PT Stop Time 1614    PT Time Calculation (min) 39 min    Activity Tolerance Patient tolerated treatment well    Behavior During Therapy [facility] for tasks assessed/performed             Past Medical History:  Diagnosis Date   CHB (complete heart block) (HCC)    Depression 12/11/2017   Endocarditis of tricuspid valve    Hepatitis C    History of complete heart block 12/11/2017   History of tricuspid valve replacement with bioprosthetic valve 12/11/2017   MRSA (methicillin resistant Staphylococcus aureus)    Opioid use disorder, moderate, in early remission (HCC) 12/11/2017   Presence of permanent cardiac pacemaker    MedTronic    Past Surgical History:  Procedure Laterality Date   BELOW KNEE LEG AMPUTATION Bilateral    PACEMAKER REVISION     moved from chest to abd.    Permanent pacemaker placement     On 11/24/17-had [facility] Jude dual-chamber permanent pacemaker placed.   right hand amputation     STUMP REVISION Right 10/25/2020   Procedure: REVISION RIGHT BELOW KNEE AMPUTATION;  Surgeon: [name], MD;  Location: [facility] OR;  Service: Orthopedics;  Laterality: Right;   Tricuspid valve replacement     On 11/17/17-replacement of tricuspid valve with a 29 mm Medtronic Mosaic mitral prosthesis.     There were no vitals filed for this visit.   Subjective Assessment - 08/02/21 1541     Subjective No new complaints. No falls.    Patient is accompained by: Family member   mom in car   How long can you stand comfortably? 15-20 mins    How long can you walk comfortably? 20 mins    Patient Stated Goals "I want to be able to hike, bike, drive."    Currently in Pain? No/denies    Pain Score 0-No pain                 [facility] Adult PT Treatment/Exercise - 08/02/21 1554       Transfers   Transfers Sit to Stand;Stand to Sit    Sit to Stand 6: Modified independent (Device/Increase time)    Stand to Sit 6: Modified independent (Device/Increase time)      Ambulation/Gait   Ambulation/Gait Yes    Ambulation/Gait Assistance 6: Modified independent (Device/Increase time)    Ambulation/Gait Assistance Details with and without cane around session    Assistive device Straight cane;Prostheses    Gait Pattern Step-through pattern;Wide base of support    Ambulation Surface Level;Indoor    Curb 4: Min assist    Curb Details (indicate cue type and reason) bloc practice on aerobic curbs next to counter with cane/prostheses working  on sequencing      Therapeutic Activites    Therapeutic Activities Other Therapeutic Activities    Other Therapeutic Activities --      Exercises   Exercises Other Exercises    Other Exercises  Legpress- 80# bil LE's. eductaed on how to set this up at the gym. also discussed proper placement of pads/bars for knee extension and flexion machines. Pt then demo'd how he is doing mini squats at home with bil UE support. pt using all arms to lower down and up. when corrected to proper technique pt unable to perform due to weakness. Attempted to have pt reverse squats by "hovering" over mat table      Manual Therapy   McConnell to left patella with lateral pull due to medial glide off center.                      PT Short Term Goals - 07/08/21 1217        PT SHORT TERM GOAL #1   Title = LTG               PT Long Term Goals - 07/31/21 1654       PT LONG TERM GOAL #1   Title Pt will tolerate wearing prostheses all awake hours with no skin issues for improved function.    Time 6    Period Weeks    Status Achieved      PT LONG TERM GOAL #2   Title Pt will improve gait speed to >/=3.00 ft/sec with B prostheses and without device in order to indicate safe community ambulation.    Baseline 3.1 ft/sec without cane    Time 6    Period Weeks    Status Achieved      PT LONG TERM GOAL #3   Title Pt will improve TUG to </=13.5 secs without AD in order to indicate dec fall risk.    Baseline 11 seconds without AD    Time 6    Period Weeks    Status Achieved      PT LONG TERM GOAL #4   Title Pt will negotiate up/down 4 steps with LRAD at mod I level in order to enter/exit home safely.    Baseline 06/29/21: min guard to min assist with rail/cane combo    Time 6    Period Weeks    Status On-going    Target Date 08/22/21      PT LONG TERM GOAL #5   Title Pt will negotiate up/down curb and ramp, ambulate 1000' w/ LRAD in order to improve community access.    Baseline 06/29/21: max distance to date ~875 feet on indoor/outdoor surfaces, has began to train on grassy hills, ramps and curbs with bil prostheses/cane with min guard to min assist    Time 6    Period Weeks    Status On-going    Target Date 08/22/21      PT LONG TERM GOAL #6   Title Pt will negotiate over uneven terrain (grass, mulch, etc) with LRAD (trekking poles if needed) x 500' in order to indicate safe return to hiking.    Baseline 06/29/21: have began to train on steep grassy hills for working towards hiking    Time 6    Period Weeks    Status On-going    Target Date 08/22/21      PT LONG TERM GOAL #7   Title Pt will improve DGI to >/=17/24 in  order to indicate dec fall risk.    Baseline 13/24 > 15/24    Time 6    Period Weeks    Status On-going    Target  Date 08/22/21                   Plan - 08/02/21 1542     Clinical Impression Statement Today's skilled session initially focused on use of McConnel taping to left knee for improved tracking with pt reporting feeling better with knee flexion/extension. Then focused on education on use of machines at gym and other ex's pt can do for LE strengthening. Ended session with continued work on Hydrographic surveyor with cane/prostheses with min guard assist for safety with cues on technique. The pt is progressing toward goals and should benefit from continued PT to progress toward unmet goals.    Personal Factors and Comorbidities Comorbidity 3+;Social Background    Comorbidities see above    Examination-Activity Limitations Carry;Caring for Others;Locomotion Level;Lift;Dressing;Bathing;Squat;Stairs;Stand    Examination-Participation Restrictions Community Activity;Meal Prep;Driving;Laundry;Cleaning    Rehab Potential Excellent    PT Frequency 2x / week    PT Duration 6 weeks    PT Treatment/Interventions ADLs/Self Care Home Management;Aquatic Therapy;DME Instruction;Gait training;Stair training;Functional mobility training;Therapeutic activities;Therapeutic exercise;Balance training;Neuromuscular re-education;Patient/family education;Prosthetic Training;Passive range of motion;Energy conservation    PT Next Visit Plan How did the taping for patella tendon do?  Continue LLE quad, hamstring, glute strengthening.  Stair negotiation with one rail, alternating sequence.  Balance activities without UE support.  Gait outside, on uneven terrain. curb training    PT Home Exercise Plan Access Code: [facility]    Consulted and Agree with Plan of Care Patient;Family member/caregiver    Family Member Consulted mother             Patient will benefit from skilled therapeutic intervention in order to improve the following deficits and impairments:  Abnormal gait, Cardiopulmonary status limiting activity,  Decreased activity tolerance, Decreased balance, Decreased endurance, Decreased knowledge of precautions, Decreased mobility, Decreased strength, Difficulty walking, Impaired perceived functional ability, Impaired UE functional use, Postural dysfunction, Prosthetic Dependency  Visit Diagnosis: Other abnormalities of gait and mobility  Unsteadiness on feet  Muscle weakness (generalized)  Abnormal posture  Difficulty in walking, not elsewhere classified     Problem List Patient Active Problem List   Diagnosis Date Noted   Amputation of forearm (HCC) 07/09/2021   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)    Dehiscence of amputation stump (HCC)    Pacemaker - MDT 09/09/2020   Wound of left leg 08/24/2020   Amputee 08/24/2020   Fatigue 07/31/2020   S/P TVR (tricuspid valve replacement) 05/11/2020   Educated about COVID-19 virus infection 05/11/2020   S/P bilateral below knee amputation (HCC) 05/09/2020   Amputation of right arm (HCC) 05/09/2020   History of bacterial endocarditis 05/02/2020   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   Gangrene of hand (HCC) 09/17/2019   Thrombocytopenia (HCC) 08/14/2019   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

## 2021-08-03 ENCOUNTER — Ambulatory Visit: Payer: Medicaid Other

## 2021-08-06 ENCOUNTER — Other Ambulatory Visit: Payer: Self-pay

## 2021-08-06 ENCOUNTER — Encounter: Payer: Self-pay | Admitting: Internal Medicine

## 2021-08-06 ENCOUNTER — Telehealth (INDEPENDENT_AMBULATORY_CARE_PROVIDER_SITE_OTHER): Payer: Medicaid Other | Admitting: Internal Medicine

## 2021-08-06 VITALS — HR 71

## 2021-08-06 DIAGNOSIS — F32A Depression, unspecified: Secondary | ICD-10-CM | POA: Diagnosis not present

## 2021-08-06 DIAGNOSIS — Z136 Encounter for screening for cardiovascular disorders: Secondary | ICD-10-CM

## 2021-08-06 DIAGNOSIS — R5383 Other fatigue: Secondary | ICD-10-CM | POA: Diagnosis not present

## 2021-08-06 DIAGNOSIS — M25569 Pain in unspecified knee: Secondary | ICD-10-CM | POA: Diagnosis not present

## 2021-08-06 DIAGNOSIS — Z89511 Acquired absence of right leg below knee: Secondary | ICD-10-CM

## 2021-08-06 DIAGNOSIS — Z131 Encounter for screening for diabetes mellitus: Secondary | ICD-10-CM

## 2021-08-06 DIAGNOSIS — Z125 Encounter for screening for malignant neoplasm of prostate: Secondary | ICD-10-CM

## 2021-08-06 DIAGNOSIS — Z1329 Encounter for screening for other suspected endocrine disorder: Secondary | ICD-10-CM

## 2021-08-06 DIAGNOSIS — Z89512 Acquired absence of left leg below knee: Secondary | ICD-10-CM | POA: Diagnosis not present

## 2021-08-06 NOTE — Progress Notes (Signed)
Pulse 71   SpO2 95%    Subjective:    Patient ID: Wesley Fuentes, male    DOB: [birth year], 45 y.o.   MRN: [id number]  Chief Complaint  Patient presents with  . Depression  . Joint Pain    HPI: Wesley Fuentes is a 45 y.o. male  Depression        This is a chronic problem.  Associated symptoms include no decreased concentration, no fatigue, no appetite change, no myalgias, no headaches and no suicidal ideas.  Chief Complaint  Patient presents with  . Depression  . Joint Pain    Relevant past medical, surgical, family and social history reviewed and updated as indicated. Interim medical history since our last visit reviewed. Allergies and medications reviewed and updated.  Review of Systems  Constitutional:  Negative for activity change, appetite change, chills, fatigue and fever.  HENT:  Negative for congestion, ear discharge, ear pain and facial swelling.   Eyes:  Negative for pain, discharge and itching.  Respiratory:  Negative for cough, chest tightness, shortness of breath and wheezing.   Cardiovascular:  Negative for chest pain, palpitations and leg swelling.  Gastrointestinal:  Negative for abdominal distention, abdominal pain, blood in stool, constipation, diarrhea, nausea and vomiting.  Endocrine: Negative for cold intolerance, heat intolerance, polydipsia, polyphagia and polyuria.  Genitourinary:  Negative for difficulty urinating, dysuria, flank pain, frequency, hematuria and urgency.  Musculoskeletal:  Negative for arthralgias, gait problem, joint swelling and myalgias.  Skin:  Negative for color change, rash and wound.  Neurological:  Negative for dizziness, tremors, speech difficulty, weakness, light-headedness, numbness and headaches.  Hematological:  Does not bruise/bleed easily.  Psychiatric/Behavioral:  Positive for depression. Negative for agitation, confusion, decreased concentration, sleep disturbance and suicidal ideas.    Per HPI unless  specifically indicated above     Objective:    Pulse 71   SpO2 95%   Wt Readings from Last 3 Encounters:  07/09/21 157 lb 12.8 oz (71.6 kg)  03/27/21 157 lb 2 oz (71.3 kg)  12/18/20 140 lb (63.5 kg)    Physical Exam Vitals and nursing note reviewed.  Constitutional:      General: He is not in acute distress.    Appearance: Normal appearance. He is not ill-appearing or diaphoretic.  HENT:     Head: Normocephalic and atraumatic.     Right Ear: Tympanic membrane and external ear normal. There is no impacted cerumen.     Left Ear: External ear normal.     Nose: No congestion or rhinorrhea.     Mouth/Throat:     Pharynx: No oropharyngeal exudate or posterior oropharyngeal erythema.  Eyes:     Conjunctiva/sclera: Conjunctivae normal.     Pupils: Pupils are equal, round, and reactive to light.  Cardiovascular:     Rate and Rhythm: Normal rate and regular rhythm.     Heart sounds: No murmur heard.   No friction rub. No gallop.  Pulmonary:     Effort: No respiratory distress.     Breath sounds: No stridor. No wheezing or rhonchi.  Chest:     Chest wall: No tenderness.  Abdominal:     General: Abdomen is flat. Bowel sounds are normal.     Palpations: Abdomen is soft. There is no mass.     Tenderness: There is no abdominal tenderness.  Musculoskeletal:     Cervical back: Normal range of motion and neck supple. No rigidity or tenderness.     Left lower  leg: No edema.  Neurological:     Mental Status: He is alert.    Results for orders placed or performed in visit on 06/06/21  CUP PACEART REMOTE DEVICE CHECK  Result Value Ref Range   Date Time Interrogation Session [id number]    Pulse Generator Manufacturer MERM    Pulse Gen Model [id number] Adine Madura SR MRI    Pulse Gen Serial Number [id number]    Clinic Name [facility]    Implantable Pulse Generator Type Implantable Pulse Generator    Implantable Pulse Generator Implant Date [id number]    Implantable Lead Manufacturer  [facility] [employer]    Implantable Lead Model [id number] [id number] CapSure Epi    Implantable Lead Serial Number [id number]    Implantable Lead Implant Date [id number]    Implantable Lead Location [id number]    Lead Channel Setting Sensing Sensitivity 1.2 mV   Lead Channel Setting Pacing Pulse Width 0.4 ms   Lead Channel Setting Pacing Amplitude 2.25 V   Lead Channel Impedance Value 798 ohm   Lead Channel Impedance Value 570 ohm   Lead Channel Sensing Intrinsic Amplitude 9.125 mV   Lead Channel Sensing Intrinsic Amplitude 9.125 mV   Lead Channel Pacing Threshold Amplitude 1.125 V   Lead Channel Pacing Threshold Pulse Width 0.4 ms   Battery Status OK    Battery Remaining Longevity 150 mo   Battery Voltage 3.05 V   Brady Statistic RV Percent Paced 99.94 %        Current Outpatient Medications:  .  Acetaminophen 325 MG CAPS, Take 650 mg by mouth every 6 (six) hours as needed (pain). , Disp: , Rfl:  .  Ascorbic Acid (VITAMIN C WITH ROSE HIPS) 500 MG tablet, Take 1 tablet (500 mg total) by mouth daily., Disp: 30 tablet, Rfl: 1 .  aspirin EC 81 MG tablet, Take 81 mg by mouth daily. Swallow whole., Disp: , Rfl:  .  buPROPion (WELLBUTRIN XL) 300 MG 24 hr tablet, Take 300 mg by mouth every morning., Disp: , Rfl:  .  ibuprofen (ADVIL) 800 MG tablet, Take 800 mg by mouth 3 (three) times daily as needed., Disp: , Rfl:  .  minocycline (MINOCIN) 100 MG capsule, Take 1 capsule (100 mg total) by mouth 2 (two) times daily., Disp: 180 capsule, Rfl: 0 .  mirtazapine (REMERON) 15 MG tablet, Take 1 tablet (15 mg total) by mouth at bedtime., Disp: 90 tablet, Rfl: 2 .  Multiple Vitamins-Minerals (MULTI COMPLETE/IRON) TABS, Take by mouth., Disp: , Rfl:  .  SUBOXONE 8-2 MG FILM, Place under the tongue., Disp: , Rfl:  .  varenicline (CHANTIX STARTING MONTH PAK) 0.5 MG X 11 & 1 MG X 42 tablet, Take one 0.5 mg tablet by mouth once daily for 3 days, then increase to one 0.5 mg tablet twice daily for 4 days, then increase to one 1 mg tablet twice  daily. (Patient not taking: No sig reported), Disp: 53 tablet, Rfl: 0    Assessment & Plan:  Depression : Is on welbutrin for such  Smoking still quit and hasnt had one in x 2 days now.    2. TV replacement to have an appt to see cards x 20th of august.   3. Pain stable, seen ortho has been getitng PT @ hosp@ [place] as he lives in [place]   Problem List Items Addressed This Visit   None    Orders Placed This Encounter  Procedures  . PSA Total+%Free (Serial)  . Comprehensive metabolic panel  .  CBC with Differential/Platelet  . Thyroid Panel With TSH  . Bayer DCA Hb A1c Waived  . Lipid panel  . Ambulatory referral to Orthopedics     No orders of the defined types were placed in this encounter.    Follow up plan: No follow-ups on file.

## 2021-08-08 ENCOUNTER — Ambulatory Visit: Payer: Medicaid Other

## 2021-08-10 ENCOUNTER — Ambulatory Visit: Payer: Medicaid Other

## 2021-08-13 ENCOUNTER — Other Ambulatory Visit: Payer: Medicaid Other

## 2021-08-15 ENCOUNTER — Ambulatory Visit: Payer: Medicaid Other

## 2021-08-15 ENCOUNTER — Other Ambulatory Visit: Payer: Self-pay

## 2021-08-15 DIAGNOSIS — R2689 Other abnormalities of gait and mobility: Secondary | ICD-10-CM

## 2021-08-15 DIAGNOSIS — M6281 Muscle weakness (generalized): Secondary | ICD-10-CM

## 2021-08-15 NOTE — Therapy (Signed)
[facility] [address] [place], [place], [address] Phone: [phone]   Fax:  [phone]  Physical Therapy Treatment  Patient Details  Name: Wesley Fuentes MRN: [id number] Date of Birth: 20-Feb-1976 Referring Provider (PT): Aldean Baker, MD [facility] [place], [place])   Encounter Date: 08/15/2021   PT End of Session - 08/15/21 1401     Visit Number 12    Number of Visits 21    Date for PT Re-Evaluation 08/22/21    Authorization Type MCD-requesting 12 more visits    Authorization Time Period 12 visits from 07/25/21 - 09/04/21    Authorization - Visit Number 3    Authorization - Number of Visits 12    PT Start Time 1400    PT Stop Time 1445    PT Time Calculation (min) 45 min    Activity Tolerance Patient tolerated treatment well    Behavior During Therapy [facility] for tasks assessed/performed             Past Medical History:  Diagnosis Date   CHB (complete heart block) (HCC)    Depression 12/11/2017   Endocarditis of tricuspid valve    Hepatitis C    History of complete heart block 12/11/2017   History of tricuspid valve replacement with bioprosthetic valve 12/11/2017   MRSA (methicillin resistant Staphylococcus aureus)    Opioid use disorder, moderate, in early remission (HCC) 12/11/2017   Presence of permanent cardiac pacemaker    MedTronic    Past Surgical History:  Procedure Laterality Date   BELOW KNEE LEG AMPUTATION Bilateral    PACEMAKER REVISION     moved from chest to abd.    Permanent pacemaker placement     On 11/24/17-had [facility] Jude dual-chamber permanent pacemaker placed.   right hand amputation     STUMP REVISION Right 10/25/2020   Procedure: REVISION RIGHT BELOW KNEE AMPUTATION;  Surgeon: [name], MD;  Location: [facility] OR;  Service: Orthopedics;  Laterality: Right;   Tricuspid valve replacement     On 11/17/17-replacement of tricuspid valve with a 29 mm Medtronic Mosaic mitral prosthesis.     There were no vitals filed for this visit.   Subjective Assessment - 08/15/21 1401     Subjective Pt reports that he will be moving back to [place] September 15th. Reports he is doing well.    Patient is accompained by: Family member   mom in car   How long can you stand comfortably? 15-20 mins    How long can you walk comfortably? 20 mins    Patient Stated Goals "I want to be able to hike, bike, drive."    Currently in Pain? No/denies                               [facility] Adult PT Treatment/Exercise - 08/15/21 1402       Ambulation/Gait   Ambulation/Gait Yes    Ambulation/Gait Assistance 5: Supervision    Ambulation Distance (Feet) 800 Feet    Assistive device Straight cane;Prosthesis    Gait Pattern Step-through pattern;Wide base of support    Ambulation Surface Level;Unlevel;Indoor;Outdoor;Grass;Paved    Stairs Yes    Stairs Assistance 4: Min guard    Stairs Assistance Details (indicate cue type and reason) with on rail left and tactile cues to tighten gluts during stance with reciprocal pattern. Pt pulling more to step up with LLE. With descent pt needed  cuing to keep forefoot off step to allow knee to come forward as ankle will not flex. Pt less steady with lowering with LLE with poor eccentric quad control.    Stair Management Technique One rail Left;Alternating pattern;Step to pattern    Number of Stairs 12    Height of Stairs 6      Neuro Re-ed    Neuro Re-ed Details  Step-ups on 6" step x 10 on each leg with 1 UE support and tactile cues at pelvis. Pt needed more UE support whe performing on left and unable to control descent with LLE all the way back down. Lateral step-up on 4" step with 1 UE support x 10 each time. Stepping on to rockerboard positioned ant/post in // bars x 5 with each leg then x 5 with 1 UE support tapping cone in front then attempted with using cane as support needing min assist at times especially when stepping with LLE in  SLS. Performed stepping up and tapping cone on 4" step instead with cane support CGA/min assist x 5 each side.      Exercises   Exercises Other Exercises    Other Exercises  mini-squats in // bars with chair behind x 10 with verbal and tactile cues to keep weight equal on legs as was initially shifting more to right.      Prosthetics   Prosthetic Care Comments  Pt currently has 10 ply sock on right and 6 ply on left.    Current prosthetic wear tolerance (days/week)  daily    Current prosthetic wear tolerance (#hours/day)  all awake hours                    PT Education - 08/15/21 1501     Education Details Discussed trying to get PCP, amputee specialist and prosthetist set up in [place] for when he moves in case he has any needs there. Also discussed working on step-ups at home and if going to get a step for room to get aerobic step so can adjust height.    Person(s) Educated Patient    Methods Explanation    Comprehension Verbalized understanding              PT Short Term Goals - 07/08/21 1217       PT SHORT TERM GOAL #1   Title = LTG               PT Long Term Goals - 07/31/21 1654       PT LONG TERM GOAL #1   Title Pt will tolerate wearing prostheses all awake hours with no skin issues for improved function.    Time 6    Period Weeks    Status Achieved      PT LONG TERM GOAL #2   Title Pt will improve gait speed to >/=3.00 ft/sec with B prostheses and without device in order to indicate safe community ambulation.    Baseline 3.1 ft/sec without cane    Time 6    Period Weeks    Status Achieved      PT LONG TERM GOAL #3   Title Pt will improve TUG to </=13.5 secs without AD in order to indicate dec fall risk.    Baseline 11 seconds without AD    Time 6    Period Weeks    Status Achieved      PT LONG TERM GOAL #4   Title Pt will negotiate up/down 4  steps with LRAD at mod I level in order to enter/exit home safely.    Baseline 06/29/21: min  guard to min assist with rail/cane combo    Time 6    Period Weeks    Status On-going    Target Date 08/22/21      PT LONG TERM GOAL #5   Title Pt will negotiate up/down curb and ramp, ambulate 1000' w/ LRAD in order to improve community access.    Baseline 06/29/21: max distance to date ~875 feet on indoor/outdoor surfaces, has began to train on grassy hills, ramps and curbs with bil prostheses/cane with min guard to min assist    Time 6    Period Weeks    Status On-going    Target Date 08/22/21      PT LONG TERM GOAL #6   Title Pt will negotiate over uneven terrain (grass, mulch, etc) with LRAD (trekking poles if needed) x 500' in order to indicate safe return to hiking.    Baseline 06/29/21: have began to train on steep grassy hills for working towards hiking    Time 6    Period Weeks    Status On-going    Target Date 08/22/21      PT LONG TERM GOAL #7   Title Pt will improve DGI to >/=17/24 in order to indicate dec fall risk.    Baseline 13/24 > 15/24    Time 6    Period Weeks    Status On-going    Target Date 08/22/21                   Plan - 08/15/21 1505     Clinical Impression Statement Pt not reporting any pain in residual limbs. Slight discomfort in knees with mini-squats. PT continued to focus on step negotiation and hip/quad strengthening. Left weaker than right and relies more on UE support when stepping up.    Personal Factors and Comorbidities Comorbidity 3+;Social Background    Comorbidities see above    Examination-Activity Limitations Carry;Caring for Others;Locomotion Level;Lift;Dressing;Bathing;Squat;Stairs;Stand    Examination-Participation Restrictions Community Activity;Meal Prep;Driving;Laundry;Cleaning    Rehab Potential Excellent    PT Frequency 2x / week    PT Duration 6 weeks    PT Treatment/Interventions ADLs/Self Care Home Management;Aquatic Therapy;DME Instruction;Gait training;Stair training;Functional mobility training;Therapeutic  activities;Therapeutic exercise;Balance training;Neuromuscular re-education;Patient/family education;Prosthetic Training;Passive range of motion;Energy conservation    PT Next Visit Plan Continue LLE quad, hamstring, glute, abductor strengthening.  Stair negotiation with one rail, alternating sequence.  Balance activities without UE support.  Gait outside, on uneven terrain. curb training. Olegario Messier- I had told Weston Brass we would go through 9/6 but his cert is up 3/26. If he is okay I will probably do his d/c on 8/26 at my visit with him next week instead of recerting for just 1 week if that is ok with him since he is moving 9/15.    PT Home Exercise Plan Access Code: [facility]    Consulted and Agree with Plan of Care Patient;Family member/caregiver    Family Member Consulted mother             Patient will benefit from skilled therapeutic intervention in order to improve the following deficits and impairments:  Abnormal gait, Cardiopulmonary status limiting activity, Decreased activity tolerance, Decreased balance, Decreased endurance, Decreased knowledge of precautions, Decreased mobility, Decreased strength, Difficulty walking, Impaired perceived functional ability, Impaired UE functional use, Postural dysfunction, Prosthetic Dependency  Visit Diagnosis: Other abnormalities of gait and mobility  Muscle weakness (generalized)     Problem List Patient Active Problem List   Diagnosis Date Noted   Amputation of forearm (HCC) 07/09/2021   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)    Dehiscence of amputation stump (HCC)    Pacemaker - MDT 09/09/2020   Wound of left leg 08/24/2020   Amputee 08/24/2020   Fatigue 07/31/2020   S/P TVR (tricuspid valve replacement) 05/11/2020   Educated about COVID-19 virus infection 05/11/2020   S/P bilateral below knee amputation (HCC) 05/09/2020   Amputation of right arm (HCC) 05/09/2020   History of bacterial endocarditis  05/02/2020   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   Gangrene of hand (HCC) 09/17/2019   Thrombocytopenia (HCC) 08/14/2019   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   Complete heart block (HCC) 12/11/2017   History of tricuspid valve replacement with bioprosthetic valve 12/11/2017   Opioid use disorder, moderate, in early remission (HCC) 12/11/2017   Depression 12/11/2017   Acute septic pulmonary embolism (HCC) 10/03/2017    Ronn MelenaEmily A Lavene Penagos, PT, DPT, NCS 08/15/2021, 3:10 PM  [facility] [address] [place], [place], [address] Phone: [phone]   Fax:  [phone]  Name: Wesley Fuentes MRN: [id number] Date of Birth: 1976/03/31

## 2021-08-17 ENCOUNTER — Ambulatory Visit: Payer: Medicaid Other | Admitting: Physical Therapy

## 2021-08-20 ENCOUNTER — Telehealth: Payer: Medicaid Other | Admitting: Internal Medicine

## 2021-08-22 ENCOUNTER — Ambulatory Visit: Payer: Medicaid Other | Admitting: Physical Therapy

## 2021-08-22 ENCOUNTER — Encounter: Payer: Self-pay | Admitting: Physical Therapy

## 2021-08-22 ENCOUNTER — Other Ambulatory Visit: Payer: Self-pay

## 2021-08-22 DIAGNOSIS — R2681 Unsteadiness on feet: Secondary | ICD-10-CM

## 2021-08-22 DIAGNOSIS — M6281 Muscle weakness (generalized): Secondary | ICD-10-CM

## 2021-08-22 DIAGNOSIS — R2689 Other abnormalities of gait and mobility: Secondary | ICD-10-CM

## 2021-08-22 NOTE — Progress Notes (Signed)
Cardiology Office Note   Date:  08/23/2021   ID:  Wesley Fuentes, DOB 01-Jul-1976, MRN [id number]  PCP:  Loura Pardon, MD  Cardiologist:   Rollene Rotunda, MD   Chief Complaint  Patient presents with   Tricuspid Valve Disease       History of Present Illness: Wesley Fuentes is a 45 y.o. male who was referred by [name], DO for follow up of tricuspid valve replacement and pacemaker placement.     He presented for follow up with a history of SBE.  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      I saw him in 2018 after he moved from  [place].  He has a history of IV drug abuse and he developed meth resistant staph aureus. He had septic emboli or vegetation on the tricuspid valve but no other valvular involvement.  He underwent valve replacement with a 27 mm Mosaic prosthetic tricuspic valve.  Postop he had complete heart block that did not resolve so he underwent [facility]  pacemaker placement.  He had moved to [place], while there unfortunately relapsed back to heroine.  He was hospitalized in August 2020 with endocarditis, complicated with septic emboli to bilateral lower and right upper extremities, lungs and spleen.  He had DIC and AKI.  He had bilateral LE and RUE amputations, he had multiple other complications.  He developed a right IJ DVT.  He had a post op hematoma.  He had PPM site errosion with MRSA.  He underwent pacer extraction and reimplantation at an abdominal site.  Other complications included tricuspid regurgitation (not thought to be a operative candidate) and subsequent ascites requiring paracentesis.     I sent him for an abdominal ultrasound which did not demonstrate ascites.  He had an echo in June 2021.   He had an echo which I reviewed.  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  Since I saw him he has gotten prosthesis and he walked in  here today on his bilateral prosthesis.  He has done well.  He denies any cardiovascular symptoms.  He gets a little winded if he walks a long distance.  However, he does his physical therapy.  He does weights for exercise.  He has had his bottom teeth removed and has dentures.  He is keeping his top teeth in good repair.  The patient denies any new symptoms such as chest discomfort, neck or arm discomfort. There has been no new shortness of breath, PND or orthopnea. There have been no reported palpitations, presyncope or syncope.    Past Medical History:  Diagnosis Date   CHB (complete heart block) (HCC)    Depression 12/11/2017   Endocarditis of tricuspid valve    Hepatitis C    History of complete heart block 12/11/2017   History of tricuspid valve replacement with bioprosthetic valve 12/11/2017   MRSA (methicillin resistant Staphylococcus aureus)    Opioid use disorder, moderate, in early remission (HCC) 12/11/2017   Presence of permanent cardiac pacemaker    Med[employer]    Past Surgical History:  Procedure Laterality Date   BELOW KNEE LEG AMPUTATION Bilateral    PACEMAKER REVISION     moved from chest to abd.    Permanent pacemaker placement     On 11/24/17-had [facility] Jude dual-chamber permanent pacemaker placed.   right hand amputation     STUMP  REVISION Right 10/25/2020   Procedure: REVISION RIGHT BELOW KNEE AMPUTATION;  Surgeon: [name], MD;  Location: [facility] OR;  Service: Orthopedics;  Laterality: Right;   Tricuspid valve replacement     On 11/17/17-replacement of tricuspid valve with a 29 mm Medtronic Mosaic mitral prosthesis.     Current Outpatient Medications  Medication Sig Dispense Refill   aspirin EC 81 MG tablet Take 81 mg by mouth daily. Swallow whole.     buPROPion (WELLBUTRIN XL) 300 MG 24 hr tablet Take 300 mg by mouth every morning.     minocycline (MINOCIN) 100 MG capsule Take 1 capsule (100 mg total) by mouth 2 (two) times daily. 180 capsule 0   SUBOXONE  8-2 MG FILM Place under the tongue.     mirtazapine (REMERON) 30 MG tablet Take 30 mg by mouth at bedtime.     No current facility-administered medications for this visit.    Allergies:   Patient has no known allergies.    ROS:  Please see the history of present illness.   Otherwise, review of systems are positive for none.   All other systems are reviewed and negative.    PHYSICAL EXAM: VS:  BP 120/80 (BP Location: Left Arm)   Pulse 71   Ht 5\' 9"  (1.753 m)   Wt 153 lb 3.2 oz (69.5 kg)   SpO2 96%   BMI 22.62 kg/m  , BMI Body mass index is 22.62 kg/m. GENERAL:  Well appearing NECK:  No jugular venous distention, waveform within normal limits, carotid upstroke brisk and symmetric, no bruits, no thyromegaly LUNGS:  Clear to auscultation bilaterally CHEST: Well-healed surgical scars HEART:  PMI not displaced or sustained,S1 and S2 within normal limits, no S3, no S4, no clicks, no rubs, 3 out of 6 diastolic murmur that increases with inspiration, no systolic murmurs ABD:  Flat, positive bowel sounds normal in frequency in pitch, no bruits, no rebound, no guarding, no midline pulsatile mass, no hepatomegaly, no splenomegaly EXT:  2 plus pulses throughout, no edema, no cyanosis no clubbing.  He has bilateral lower extremity above-the-knee amputations and a right mid forearm hand amputation   EKG:  EKG is   ordered today. Normal sinus rhythm with complete pleat heart block with ventricular pacing 100% capture.   Recent Labs: 10/25/2020: ALT 14; BUN 20; Creatinine, Ser 0.83; Hemoglobin 13.1; Platelets 156; Potassium 4.2; Sodium 137    Lipid Panel No results found for: CHOL, TRIG, HDL, CHOLHDL, VLDL, LDLCALC, LDLDIRECT    Wt Readings from Last 3 Encounters:  08/23/21 153 lb 3.2 oz (69.5 kg)  07/09/21 157 lb 12.8 oz (71.6 kg)  03/27/21 157 lb 2 oz (71.3 kg)      Other studies Reviewed: Additional studies/ records that were reviewed today include: Labs. Review of the above  records demonstrates: See elsewhere.   ASSESSMENT AND PLAN:  TVR:  He has severe TV stenosis.    he has done well with this.  Current guideline recommendations suggest conservative management as long as he is not having any issues and he still is not having any issues.  I will defer further imaging this year as he is moving to 03/29/21. PPM: He is up to date with follow up.  He has 150 months of battery longevity.  He has VVIR.   IJ DVT:    He completed therapy.  No further imaging or change in therapy.      Current medicines are reviewed at length with the patient  today.  The patient does not have concerns regarding medicines.  The following changes have been made:   None  Labs/ tests ordered today include: None  Orders Placed This Encounter  Procedures   EKG 12-Lead      Disposition:   FU with a new electrophysiologist and cardiologist in [place].   Signed, Rollene Rotunda, MD  08/23/2021 12:03 PM    [facility]

## 2021-08-23 ENCOUNTER — Ambulatory Visit (INDEPENDENT_AMBULATORY_CARE_PROVIDER_SITE_OTHER): Payer: Medicaid Other | Admitting: Cardiology

## 2021-08-23 ENCOUNTER — Encounter: Payer: Self-pay | Admitting: Cardiology

## 2021-08-23 VITALS — BP 120/80 | HR 71 | Ht 69.0 in | Wt 153.2 lb

## 2021-08-23 DIAGNOSIS — Z953 Presence of xenogenic heart valve: Secondary | ICD-10-CM

## 2021-08-23 DIAGNOSIS — Z950 Presence of cardiac pacemaker: Secondary | ICD-10-CM | POA: Diagnosis not present

## 2021-08-23 NOTE — Patient Instructions (Signed)
Medication Instructions:  No Changes In Medications at this time.  *If you need a refill on your cardiac medications before your next appointment, please call your pharmacy*  Follow-Up: At [facility], you and your health needs are our priority.  As part of our continuing mission to provide you with exceptional heart care, we have created designated Provider Care Teams.  These Care Teams include your primary Cardiologist (physician) and Advanced Practice Providers (APPs -  Physician Assistants and Nurse Practitioners) who all work together to provide you with the care you need, when you need it.  Your next appointment:   NO FOLLOW UP NEEDED

## 2021-08-23 NOTE — Therapy (Signed)
[facility] [address] [place], [place], [address] Phone: [phone]   Fax:  [phone]  Physical Therapy Treatment  Patient Details  Name: Wesley Fuentes MRN: [id number] Date of Birth: [birth year] Referring Provider (PT): Meridee Score, MD [facility], [place])   Encounter Date: 08/22/2021   PT End of Session - 08/22/21 2111     Visit Number 13    Number of Visits 21    Date for PT Re-Evaluation 08/22/21    Authorization Type MCD-requesting 12 more visits    Authorization Time Period 12 visits from 07/25/21 - 09/04/21    Authorization - Visit Number 4    Authorization - Number of Visits 12    PT Start Time 7902    PT Stop Time 1445    PT Time Calculation (min) 40 min    Equipment Utilized During Treatment Gait belt    Activity Tolerance Patient tolerated treatment well    Behavior During Therapy WFL for tasks assessed/performed             Past Medical History:  Diagnosis Date   CHB (complete heart block) ([place])    Depression 12/11/2017   Endocarditis of tricuspid valve    Hepatitis C    History of complete heart block 12/11/2017   History of tricuspid valve replacement with bioprosthetic valve 12/11/2017   MRSA (methicillin resistant Staphylococcus aureus)    Opioid use disorder, moderate, in early remission ([place]) 12/11/2017   Presence of permanent cardiac pacemaker    MedTronic    Past Surgical History:  Procedure Laterality Date   BELOW KNEE LEG AMPUTATION Bilateral    PACEMAKER REVISION     moved from chest to abd.    Permanent pacemaker placement     On 11/24/17-had [facility] Jude dual-chamber permanent pacemaker placed.   right hand amputation     STUMP REVISION Right 10/25/2020   Procedure: REVISION RIGHT BELOW KNEE AMPUTATION;  Surgeon: Newt Minion, MD;  Location: [facility];  Service: Orthopedics;  Laterality: Right;   Tricuspid valve replacement     On 11/17/17-replacement of tricuspid  valve with a 29 mm Medtronic Mosaic mitral prosthesis.    There were no vitals filed for this visit.   Subjective Assessment - 08/22/21 2123     Subjective No new complaints. No falls or pain to report.    Patient is accompained by: Family member   mom in car   How long can you stand comfortably? 15-20 mins    How long can you walk comfortably? 20 mins    Patient Stated Goals "I want to be able to hike, bike, drive."    Currently in Pain? No/denies    Pain Score 0-No pain                     [facility] Adult PT Treatment/Exercise - 08/22/21 1423       Transfers   Transfers Sit to Stand;Stand to Sit    Sit to Stand 6: Modified independent (Device/Increase time)    Stand to Sit 6: Modified independent (Device/Increase time)      Ambulation/Gait   Ambulation/Gait Yes    Ambulation/Gait Assistance 6: Modified independent (Device/Increase time)    Ambulation/Gait Assistance Details first gait rep focused on distance with intermittent use of cane on outdoor surfaces with no balance loss or issues noted. second gait rep focused on gait on various compliant surfaces with ne assist needed, supervision for safety. had pt  negotiated across parking lot, up curb, over grass>pine needles with small shrub to steep grassy hill and back the same way. pt using cane with bil prostheses with no balance issues noted.    Ambulation Distance (Feet) 1000 Feet   x1 working on distance; ~500 feet on compliant surface   Assistive device Straight cane;Prosthesis    Gait Pattern Step-through pattern;Wide base of support    Ambulation Surface Level;Unlevel;Indoor;Outdoor;Paved;Grass;Other (comment)   pine needles, mulch, small shrubs   Ramp 6: Modified independent (Device)    Ramp Details (indicate cue type and reason) with cane/bil prostheses on outdoor curbs with gait reps. then on indoor ramp x1 Mod I with cane/bil prostheses.    Curb 6: Modified independent (Device/increase time)    Curb Details  (indicate cue type and reason) with cane/prostheses with no balance issues noted with outdoor curbs x 3 reps with gait reps.      Exercises   Exercises Other Exercises    Other Exercises  pt with questions about strengthing his right bicep/tricep as he can't grip a weight due to amputation. discussed use of resistance bands and wrap stay ankle/wrist weights.                      PT Short Term Goals - 07/08/21 1217       PT SHORT TERM GOAL #1   Title = LTG               PT Long Term Goals - 08/22/21 1414       PT LONG TERM GOAL #1   Title Pt will tolerate wearing prostheses all awake hours with no skin issues for improved function.    Baseline 08/22/21: met to date    Status Achieved      PT LONG TERM GOAL #2   Title Pt will improve gait speed to >/=3.00 ft/sec with B prostheses and without device in order to indicate safe community ambulation.    Baseline 07/31/21: 3.1 ft/sec without cane    Time --    Period --    Status Achieved      PT LONG TERM GOAL #3   Title Pt will improve TUG to </=13.5 secs without AD in order to indicate dec fall risk.    Baseline 07/31/21: 11 seconds without AD    Time --    Period --    Status Achieved      PT LONG TERM GOAL #4   Title Pt will negotiate up/down 4 steps with LRAD at mod I level in order to enter/exit home safely.    Baseline 06/29/21: min guard to min assist with rail/cane combo    Time 6    Period Weeks    Status On-going      PT LONG TERM GOAL #5   Title Pt will negotiate up/down curb and ramp, ambulate 1000' w/ LRAD in order to improve community access.    Baseline 08/22/21: met with session today with straight cane/bil prostheses    Time --    Period --    Status Achieved      PT LONG TERM GOAL #6   Title Pt will negotiate over uneven terrain (grass, mulch, etc) with LRAD (trekking poles if needed) x 500' in order to indicate safe return to hiking.    Baseline 08/22/21: met in session today with cane     Time --    Period --    Status Achieved  PT LONG TERM GOAL #7   Title Pt will improve DGI to >/=17/24 in order to indicate dec fall risk.    Baseline 13/24 > 15/24    Time 6    Period Weeks    Status On-going                   Plan - 08/22/21 2120     Clinical Impression Statement Today's skilled session began to address progress toward LTGs as pt's cert ends this week and pt is agreeable to discharge at the end of this week. Pt has met both outdoor gait goals for distance and for on compliant surfaces. Will plan to check remaining goals next session and finalize HEP. Pt is moving to [place] middle of September and will plan to address further prosthetic training there.    Personal Factors and Comorbidities Comorbidity 3+;Social Background    Comorbidities see above    Examination-Activity Limitations Carry;Caring for Others;Locomotion Level;Lift;Dressing;Bathing;Squat;Stairs;Stand    Examination-Participation Restrictions Community Activity;Meal Prep;Driving;Laundry;Cleaning    Rehab Potential Excellent    PT Frequency 2x / week    PT Duration 6 weeks    PT Treatment/Interventions ADLs/Self Care Home Management;Aquatic Therapy;DME Instruction;Gait training;Stair training;Functional mobility training;Therapeutic activities;Therapeutic exercise;Balance training;Neuromuscular re-education;Patient/family education;Prosthetic Training;Passive range of motion;Energy conservation    PT Next Visit Plan check remaining goals for discharge    PT Home Exercise Plan Access Code: [facility]    Consulted and Agree with Plan of Care Patient;Family member/caregiver    Family Member Consulted mother             Patient will benefit from skilled therapeutic intervention in order to improve the following deficits and impairments:  Abnormal gait, Cardiopulmonary status limiting activity, Decreased activity tolerance, Decreased balance, Decreased endurance, Decreased knowledge of precautions,  Decreased mobility, Decreased strength, Difficulty walking, Impaired perceived functional ability, Impaired UE functional use, Postural dysfunction, Prosthetic Dependency  Visit Diagnosis: Other abnormalities of gait and mobility  Muscle weakness (generalized)  Unsteadiness on feet     Problem List Patient Active Problem List   Diagnosis Date Noted   Amputation of forearm ([place]) 07/09/2021   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 ([place])    Dehiscence of amputation stump ([place])    Pacemaker - MDT 09/09/2020   Wound of left leg 08/24/2020   Amputee 08/24/2020   Fatigue 07/31/2020   S/P TVR (tricuspid valve replacement) 05/11/2020   Educated about COVID-19 virus infection 05/11/2020   S/P bilateral below knee amputation ([place]) 05/09/2020   Amputation of right arm ([place]) 05/09/2020   History of bacterial endocarditis 05/02/2020   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   Gangrene of hand ([place]) 09/17/2019   Thrombocytopenia ([place]) 08/14/2019   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

## 2021-08-24 ENCOUNTER — Other Ambulatory Visit: Payer: Self-pay

## 2021-08-24 ENCOUNTER — Ambulatory Visit: Payer: Medicaid Other

## 2021-08-24 DIAGNOSIS — R2689 Other abnormalities of gait and mobility: Secondary | ICD-10-CM | POA: Diagnosis not present

## 2021-08-24 DIAGNOSIS — R2681 Unsteadiness on feet: Secondary | ICD-10-CM

## 2021-08-24 NOTE — Therapy (Signed)
[facility] [address] [place], [place], [address] Phone: [phone]   Fax:  [phone]  Physical Therapy Treatment/Discharge visit  Patient Details  Name: Wesley Fuentes MRN: [id number] Date of Birth: Apr 21, 1976 Referring Provider (PT): Meridee Score, MD [facility], [place])  PHYSICAL THERAPY DISCHARGE SUMMARY  Visits from Start of Care: 14  Current functional level related to goals / functional outcomes: See clinical impression for more information. Pt is ambulating without AD in home and with SPC in community mod I. He has show improvement on balance as evident by improvement in DGI score to 21/24. Pt will be moving to [place] in a couple weeks.   Remaining deficits: High level balance, left hip/knee weakness   Education / Equipment: HEP, prosthetic training, [place]   Patient agrees to discharge. Patient goals were met. Patient is being discharged due to meeting the stated rehab goals.  Encounter Date: 08/24/2021   PT End of Session - 08/24/21 1406     Visit Number 14    Number of Visits 21    Date for PT Re-Evaluation 08/22/21    Authorization Type MCD-requesting 12 more visits    Authorization Time Period 12 visits from 07/25/21 - 09/04/21    Authorization - Visit Number 5    Authorization - Number of Visits 12    PT Start Time 1405   discharge visit   PT Stop Time 1422    PT Time Calculation (min) 17 min    Equipment Utilized During Treatment Gait belt    Activity Tolerance Patient tolerated treatment well    Behavior During Therapy WFL for tasks assessed/performed             Past Medical History:  Diagnosis Date   CHB (complete heart block) ([place])    Depression 12/11/2017   Endocarditis of tricuspid valve    Hepatitis C    History of complete heart block 12/11/2017   History of tricuspid valve replacement with bioprosthetic valve 12/11/2017   MRSA (methicillin resistant Staphylococcus  aureus)    Opioid use disorder, moderate, in early remission ([place]) 12/11/2017   Presence of permanent cardiac pacemaker    MedTronic    Past Surgical History:  Procedure Laterality Date   BELOW KNEE LEG AMPUTATION Bilateral    PACEMAKER REVISION     moved from chest to abd.    Permanent pacemaker placement     On 11/24/17-had [facility] Jude dual-chamber permanent pacemaker placed.   right hand amputation     STUMP REVISION Right 10/25/2020   Procedure: REVISION RIGHT BELOW KNEE AMPUTATION;  Surgeon: Newt Minion, MD;  Location: [facility];  Service: Orthopedics;  Laterality: Right;   Tricuspid valve replacement     On 11/17/17-replacement of tricuspid valve with a 29 mm Medtronic Mosaic mitral prosthesis.    There were no vitals filed for this visit.   Subjective Assessment - 08/24/21 1407     Subjective Pt reports that he saw her heart doctor yesterday and everything went well. He is in process of getting his medical papers for his move.    Patient is accompained by: Family member   mom in car   How long can you stand comfortably? 15-20 mins    How long can you walk comfortably? 20 mins    Patient Stated Goals "I want to be able to hike, bike, drive."    Currently in Pain? No/denies  [facility] Adult PT Treatment/Exercise - 08/24/21 1408       Ambulation/Gait   Ambulation/Gait Yes    Ambulation/Gait Assistance 6: Modified independent (Device/Increase time)    Ambulation/Gait Assistance Details around in clinic with activities. Ambulated in with [facility] [address] but did not use during session.    Assistive device Straight cane;Prostheses;None    Gait Pattern Step-through pattern;Wide base of support    Ambulation Surface Level;Indoor    Stairs Yes    Stairs Assistance 6: Modified independent (Device/Increase time);5: Supervision    Stairs Assistance Details (indicate cue type and reason) one rail left. With initial 4 steps performed with step-to  pattern mod I. 2nd 4 steps reciprocal pattern with verbal cues to be sure that forefoot off step with descent. Pt weaker on LLE and relies more on UE support with reciprocal pattern.    Stair Management Technique One rail Left;Step to pattern;Alternating pattern    Number of Stairs 8    Height of Stairs 6      Dynamic Gait Index   Level Surface Normal    Change in Gait Speed Normal    Gait with Horizontal Head Turns Normal    Gait with Vertical Head Turns Normal    Gait and Pivot Turn Mild Impairment    Step Over Obstacle Mild Impairment    Step Around Obstacles Normal    Steps Mild Impairment    Total Score 21      Prosthetics   Prosthetic Care Comments  Pt currently wearing 8 ply on right and 6 ply on left. Pt reports that he removes leg occasionally during day if feels sore but is not having any sweating issues.    Current prosthetic wear tolerance (days/week)  daily    Current prosthetic wear tolerance (#hours/day)  all awake hours    Residual limb condition  denies any skin issues                    PT Education - 08/24/21 1431     Education Details Discussed results of testing and proceeding with discharge as planned as pt is moving. He will pursue w/c eval when gets to [facility]. Denies any questions on HEP. Advised to contact medical records to get his records.    Person(s) Educated Patient    Methods Explanation    Comprehension Verbalized understanding              PT Short Term Goals - 07/08/21 1217       PT SHORT TERM GOAL #1   Title = LTG               PT Long Term Goals - 08/24/21 1419       PT LONG TERM GOAL #1   Title Pt will tolerate wearing prostheses all awake hours with no skin issues for improved function.    Baseline 08/22/21: met to date    Status Achieved      PT LONG TERM GOAL #2   Title Pt will improve gait speed to >/=3.00 ft/sec with B prostheses and without device in order to indicate safe community ambulation.     Baseline 07/31/21: 3.1 ft/sec without cane    Status Achieved      PT LONG TERM GOAL #3   Title Pt will improve TUG to </=13.5 secs without AD in order to indicate dec fall risk.    Baseline 07/31/21: 11 seconds without AD    Status Achieved  PT LONG TERM GOAL #4   Title Pt will negotiate up/down 4 steps with LRAD at mod I level in order to enter/exit home safely.    Baseline 06/29/21: min guard to min assist with rail/cane combo. 08/24/21 mod I with step-to pattern with 1 rail, supervision with reciprocal pattern with heavy reliance on rail    Time 6    Period Weeks    Status Achieved      PT LONG TERM GOAL #5   Title Pt will negotiate up/down curb and ramp, ambulate 1000' w/ LRAD in order to improve community access.    Baseline 08/22/21: met with session today with straight cane/bil prostheses    Status Achieved      PT LONG TERM GOAL #6   Title Pt will negotiate over uneven terrain (grass, mulch, etc) with LRAD (trekking poles if needed) x 500' in order to indicate safe return to hiking.    Baseline 08/22/21: met in session today with cane    Status Achieved      PT LONG TERM GOAL #7   Title Pt will improve DGI to >/=17/24 in order to indicate dec fall risk.    Baseline 13/24 > 15/24. 08/24/21 21/24    Time 6    Period Weeks    Status Achieved                   Plan - 08/24/21 1432     Clinical Impression Statement PT assessed remaining goals today. Pt has met all goals. He was able to negotiate steps with 1 rail in step-to pattern mod I and supervision with reciprocal pattern as relies more on UE support for left leg. Pt improved DGI score to 21/24 indicating decreased fall risk. He is ambulating with SPC in community. PT discharging today as pt is moving back to [place]. He will resume care up there as needed.    Personal Factors and Comorbidities Comorbidity 3+;Social Background    Comorbidities see above    Examination-Activity Limitations Carry;Caring for  Others;Locomotion Level;Lift;Dressing;Bathing;Squat;Stairs;Stand    Examination-Participation Restrictions Community Activity;Meal Prep;Driving;Laundry;Cleaning    Rehab Potential Excellent    PT Frequency 2x / week    PT Duration 6 weeks    PT Treatment/Interventions ADLs/Self Care Home Management;Aquatic Therapy;DME Instruction;Gait training;Stair training;Functional mobility training;Therapeutic activities;Therapeutic exercise;Balance training;Neuromuscular re-education;Patient/family education;Prosthetic Training;Passive range of motion;Energy conservation    PT Next Visit Plan Discharge today    PT Home Exercise Plan Access Code: [facility]    Consulted and Agree with Plan of Care Patient;Family member/caregiver    Family Member Consulted mother             Patient will benefit from skilled therapeutic intervention in order to improve the following deficits and impairments:  Abnormal gait, Cardiopulmonary status limiting activity, Decreased activity tolerance, Decreased balance, Decreased endurance, Decreased knowledge of precautions, Decreased mobility, Decreased strength, Difficulty walking, Impaired perceived functional ability, Impaired UE functional use, Postural dysfunction, Prosthetic Dependency  Visit Diagnosis: Other abnormalities of gait and mobility  Unsteadiness on feet     Problem List Patient Active Problem List   Diagnosis Date Noted   Amputation of forearm ([place]) 07/09/2021   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 ([place])    Dehiscence of amputation stump ([place])    Pacemaker - MDT 09/09/2020   Wound of left leg 08/24/2020   Amputee 08/24/2020   Fatigue 07/31/2020   S/P TVR (tricuspid valve replacement) 05/11/2020   Educated about COVID-19  virus infection 05/11/2020   S/P bilateral below knee amputation ([place]) 05/09/2020   Amputation of right arm ([place]) 05/09/2020   History of bacterial endocarditis 05/02/2020   Prosthetic valve  endocarditis ([place]) 04/16/2020   Gangrene of hand ([place]) 09/17/2019   Thrombocytopenia ([place]) 08/14/2019   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 [place], [place], [address] Phone: [phone]   Fax:  [phone]  Name: RANDON SOMERA MRN: [id number] Date of Birth: 1976/10/01

## 2021-08-29 ENCOUNTER — Ambulatory Visit: Payer: Medicaid Other

## 2021-08-31 ENCOUNTER — Ambulatory Visit: Payer: Medicaid Other

## 2021-09-05 ENCOUNTER — Ambulatory Visit (INDEPENDENT_AMBULATORY_CARE_PROVIDER_SITE_OTHER): Payer: Medicaid Other

## 2021-09-05 DIAGNOSIS — I442 Atrioventricular block, complete: Secondary | ICD-10-CM | POA: Diagnosis not present

## 2021-09-05 LAB — CUP PACEART REMOTE DEVICE CHECK
Battery Remaining Longevity: 133 mo
Battery Voltage: 3.03 V
Brady Statistic RV Percent Paced: 99.97 %
Date Time Interrogation Session: 20220906214627
Implantable Lead Implant Date: 20210308
Implantable Lead Location: 753862
Implantable Lead Model: 4968
Implantable Pulse Generator Implant Date: 20210308
Lead Channel Impedance Value: 551 Ohm
Lead Channel Impedance Value: 741 Ohm
Lead Channel Pacing Threshold Amplitude: 1.375 V
Lead Channel Pacing Threshold Pulse Width: 0.4 ms
Lead Channel Sensing Intrinsic Amplitude: 9 mV
Lead Channel Sensing Intrinsic Amplitude: 9 mV
Lead Channel Setting Pacing Amplitude: 2.75 V
Lead Channel Setting Pacing Pulse Width: 0.4 ms
Lead Channel Setting Sensing Sensitivity: 1.2 mV

## 2021-09-13 NOTE — Progress Notes (Signed)
Remote pacemaker transmission.   

## 2021-09-18 ENCOUNTER — Ambulatory Visit: Payer: Medicaid Other | Admitting: Physical Therapy

## 2021-09-26 ENCOUNTER — Encounter: Payer: Self-pay | Admitting: General Surgery

## 2021-10-18 ENCOUNTER — Other Ambulatory Visit: Payer: Self-pay | Admitting: Internal Medicine

## 2021-10-18 NOTE — Telephone Encounter (Signed)
Copied from CRM [id number]. Topic: Quick Communication - Rx Refill/Question >> Oct 18, 2021  3:03 PM Marylen Ponto wrote: Medication: mirtazapine (REMERON) 30 MG tablet  Has the patient contacted their pharmacy? No. Pt out of state and need Rx sent to  another pharamcy (Agent: If no, request that the patient contact the pharmacy for the refill.) (Agent: If yes, when and what did the pharmacy advise?)  Preferred Pharmacy (with phone number or street name): [facility] [address], [place] - [address]  Phone: [phone]  Fax: [phone]  Has the patient been seen for an appointment in the last year OR does the patient have an upcoming appointment? Yes.    Agent: Please be advised that RX refills may take up to 3 business days. We ask that you follow-up with your pharmacy.

## 2021-10-19 ENCOUNTER — Telehealth: Payer: Self-pay | Admitting: Internal Medicine

## 2021-10-19 NOTE — Telephone Encounter (Signed)
Pts mother Rowin Bayron is calling to request Medical Records for the patient. Pt is disabled and they have moved [place].  Please advise [phone]

## 2021-10-19 NOTE — Telephone Encounter (Signed)
Pts mother is calling back to check on the status of the medication request. The Patient has moved [place]. And has a NPA in [place].  Advised that medication refills can take up to three business days.  Mother stated that the pt has been out of the medication for 3 days.

## 2021-10-22 NOTE — Telephone Encounter (Signed)
Please see medication request below for mirtazapine (REMERON) 30 MG tablet.

## 2021-10-23 NOTE — Telephone Encounter (Signed)
Spoke with patient's mother,Barbara Timson and advised records release form will need to be printed from the [facility] website and returned, records/visits can be obtained through patient's MyChart or she can contact our Medical Records department via phone.

## 2021-10-23 NOTE — Telephone Encounter (Signed)
Pt has.moved to [place]  Pt has a new pt appt on 10/30/2021 with new dr in [place]. However, pt established with Dr Charlotta Newton and did not need the remeron at the time of visit, he had refills from other pcp. Now pt has been out of this medication for over a week and this is not good. Pt is requesting 8 pills please.  Mother is concerned, and would appreciate this being address asap.  As she has called several times with no response.  mirtazapine (REMERON) 30 MG tablet [facility] [address] [place] - [address]  Wesley Fuentes mom who is his caregiver Cb [phone]

## 2021-10-24 MED ORDER — BUPROPION HCL ER (XL) 300 MG PO TB24: 300.0000 mg | ORAL_TABLET | Freq: Every morning | ORAL | 0 refills | Status: AC

## 2021-10-24 MED ORDER — MIRTAZAPINE 30 MG PO TABS: 30.0000 mg | ORAL_TABLET | Freq: Every day | ORAL | 0 refills | Status: AC

## 2021-10-24 NOTE — Telephone Encounter (Signed)
Called and spoke to patient's mother. She states that she is very upset that this has not been taken care of yet as she originally called last week for his medication. She states that we should know why he is on this medication as he was being seen at a [facility] facility before transferring here. I apologized to the patient's mother and explained that we have never written the prescription so Dr. Charlotta Newton wanted to know why the patient takes this medication. Patients mother states that he takes this medication because he is a triple amputee with depression. States he cannot sleep without it and has been going through withdrawals. Mother states that the patient only needs 8 tablets to get through.   Mother also states that the patient needs his wellbutrin as well.

## 2021-10-24 NOTE — Telephone Encounter (Signed)
Called and notified patient's mother that prescriptions were sent in.

## 2021-11-26 ENCOUNTER — Other Ambulatory Visit: Payer: Self-pay | Admitting: Internal Medicine

## 2021-11-27 NOTE — Telephone Encounter (Signed)
Medications to be filled at an out of state pharmacy.

## 2021-11-28 NOTE — Telephone Encounter (Signed)
Requested medication (s) are due for refill today: yes  Requested medication (s) are on the active medication list: yes  Last refill:  10/24/21 #30/0RF  Future visit scheduled: No  Notes to clinic:  Unable to refill per protocol due to failed labs, no updated results. [place]. Please advise     Requested Prescriptions  Pending Prescriptions Disp Refills   mirtazapine (REMERON) 30 MG tablet [Pharmacy Med Name: Mirtazapine 30 MG Oral Tablet] 30 tablet 0    Sig: Take 1 tablet (30 mg total) by mouth at bedtime.     Psychiatry: Antidepressants - mirtazapine Failed - 11/26/2021 11:37 AM      Failed - AST in normal range and within 360 days    AST  Date Value Ref Range Status  10/25/2020 20 15 - 41 U/L Final          Failed - ALT in normal range and within 360 days    ALT  Date Value Ref Range Status  10/25/2020 14 0 - 44 U/L Final          Failed - Triglycerides in normal range and within 360 days    No results found for: TRIG, POCTRIG        Failed - Total Cholesterol in normal range and within 360 days    No results found for: CHOL, POCCHOL, CHOLTOT        Failed - WBC in normal range and within 360 days    WBC  Date Value Ref Range Status  10/25/2020 4.4 4.0 - 10.5 K/uL Final          Passed - Completed PHQ-2 or PHQ-9 in the last 360 days      Passed - Valid encounter within last 6 months    Recent Outpatient Visits           3 months ago Knee pain, unspecified chronicity, unspecified laterality   [facility] Vigg, Avanti, MD   4 months ago Knee joint stiffness, bilateral   [facility] Vigg, Avanti, MD               buPROPion (WELLBUTRIN XL) 300 MG 24 hr tablet [Pharmacy Med Name: buPROPion HCl ER (XL) 300 MG Oral Tablet Extended Release 24 Hour] 30 tablet 0    Sig: Take 1 tablet (300 mg total) by mouth every morning.     Psychiatry: Antidepressants - bupropion Passed - 11/26/2021 11:37 AM       Passed - Completed PHQ-2 or PHQ-9 in the last 360 days      Passed - Last BP in normal range    BP Readings from Last 1 Encounters:  08/23/21 120/80          Passed - Valid encounter within last 6 months    Recent Outpatient Visits           3 months ago Knee pain, unspecified chronicity, unspecified laterality   [facility] Vigg, Avanti, MD   4 months ago Knee joint stiffness, bilateral   [facility] Vigg, Avanti, MD

## 2021-11-28 NOTE — Telephone Encounter (Signed)
Patient last seen 08/06/21

## 2021-11-30 NOTE — Telephone Encounter (Signed)
Tried calling patient to schedule appt, however no one answered phone, unable to leave message

## 2021-12-05 ENCOUNTER — Ambulatory Visit (INDEPENDENT_AMBULATORY_CARE_PROVIDER_SITE_OTHER): Payer: Medicaid Other

## 2021-12-05 DIAGNOSIS — I442 Atrioventricular block, complete: Secondary | ICD-10-CM

## 2021-12-05 LAB — CUP PACEART REMOTE DEVICE CHECK
Battery Remaining Longevity: 125 mo
Battery Voltage: 3.03 V
Brady Statistic RV Percent Paced: 99.98 %
Date Time Interrogation Session: 20221207023237
Implantable Lead Implant Date: 20210308
Implantable Lead Location: 753862
Implantable Lead Model: 4968
Implantable Pulse Generator Implant Date: 20210308
Lead Channel Impedance Value: 532 Ohm
Lead Channel Impedance Value: 703 Ohm
Lead Channel Pacing Threshold Amplitude: 1.375 V
Lead Channel Pacing Threshold Pulse Width: 0.4 ms
Lead Channel Sensing Intrinsic Amplitude: 9 mV
Lead Channel Sensing Intrinsic Amplitude: 9 mV
Lead Channel Setting Pacing Amplitude: 3 V
Lead Channel Setting Pacing Pulse Width: 0.4 ms
Lead Channel Setting Sensing Sensitivity: 1.2 mV

## 2021-12-14 NOTE — Progress Notes (Signed)
Remote pacemaker transmission.
